# Patient Record
Sex: Female | Born: 2000 | Race: White | Hispanic: No | Marital: Single | Smoking: Former smoker
Health system: Southern US, Community
[De-identification: ages and names within clinical notes are randomized; demographics above are authoritative.]

## PROBLEM LIST (undated history)

## (undated) ENCOUNTER — Ambulatory Visit: Admission: EM

## (undated) ENCOUNTER — Inpatient Hospital Stay: Payer: Self-pay

## (undated) ENCOUNTER — Emergency Department: Discharge status: Absent without leave | Payer: Medicaid Other

## (undated) DIAGNOSIS — F605 Obsessive-compulsive personality disorder: Secondary | ICD-10-CM

## (undated) DIAGNOSIS — R51 Headache: Secondary | ICD-10-CM

## (undated) DIAGNOSIS — R519 Headache, unspecified: Secondary | ICD-10-CM

## (undated) DIAGNOSIS — F32A Depression, unspecified: Secondary | ICD-10-CM

## (undated) DIAGNOSIS — F909 Attention-deficit hyperactivity disorder, unspecified type: Secondary | ICD-10-CM

## (undated) DIAGNOSIS — F419 Anxiety disorder, unspecified: Secondary | ICD-10-CM

## (undated) DIAGNOSIS — F329 Major depressive disorder, single episode, unspecified: Secondary | ICD-10-CM

## (undated) HISTORY — PX: NO PAST SURGERIES: SHX2092

---
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
## 2019-04-01 ENCOUNTER — Emergency Department
Admission: EM | Admit: 2019-04-01 | Discharge: 2019-04-02 | Disposition: A | Discharge status: Other Acute Inpatient Hospital | Payer: Medicaid Other | Attending: Emergency Medicine | Admitting: Emergency Medicine

## 2019-04-01 ENCOUNTER — Encounter: Payer: Self-pay | Admitting: Emergency Medicine

## 2019-04-01 ENCOUNTER — Other Ambulatory Visit: Payer: Self-pay

## 2019-04-01 DIAGNOSIS — Z20828 Contact with and (suspected) exposure to other viral communicable diseases: Secondary | ICD-10-CM | POA: Diagnosis not present

## 2019-04-01 DIAGNOSIS — F16188 Hallucinogen abuse with other hallucinogen-induced disorder: Secondary | ICD-10-CM | POA: Diagnosis not present

## 2019-04-01 DIAGNOSIS — F1910 Other psychoactive substance abuse, uncomplicated: Secondary | ICD-10-CM

## 2019-04-01 DIAGNOSIS — R451 Restlessness and agitation: Secondary | ICD-10-CM | POA: Diagnosis present

## 2019-04-01 DIAGNOSIS — R45851 Suicidal ideations: Secondary | ICD-10-CM | POA: Insufficient documentation

## 2019-04-01 DIAGNOSIS — Z79899 Other long term (current) drug therapy: Secondary | ICD-10-CM | POA: Insufficient documentation

## 2019-04-01 DIAGNOSIS — F4310 Post-traumatic stress disorder, unspecified: Secondary | ICD-10-CM | POA: Diagnosis present

## 2019-04-01 DIAGNOSIS — F329 Major depressive disorder, single episode, unspecified: Secondary | ICD-10-CM | POA: Insufficient documentation

## 2019-04-01 DIAGNOSIS — Z87891 Personal history of nicotine dependence: Secondary | ICD-10-CM | POA: Insufficient documentation

## 2019-04-01 LAB — CBC
HCT: 44.3 % (ref 36.0–46.0)
Hemoglobin: 15.2 g/dL — ABNORMAL HIGH (ref 12.0–15.0)
MCH: 30.2 pg (ref 26.0–34.0)
MCHC: 34.3 g/dL (ref 30.0–36.0)
MCV: 88.1 fL (ref 80.0–100.0)
Platelets: 314 10*3/uL (ref 150–400)
RBC: 5.03 MIL/uL (ref 3.87–5.11)
RDW: 12.1 % (ref 11.5–15.5)
WBC: 6.2 10*3/uL (ref 4.0–10.5)
nRBC: 0 % (ref 0.0–0.2)

## 2019-04-01 LAB — URINE DRUG SCREEN, QUALITATIVE (ARMC ONLY)
Amphetamines, Ur Screen: POSITIVE — AB
Barbiturates, Ur Screen: NOT DETECTED
Benzodiazepine, Ur Scrn: NOT DETECTED
Cannabinoid 50 Ng, Ur ~~LOC~~: POSITIVE — AB
Cocaine Metabolite,Ur ~~LOC~~: NOT DETECTED
MDMA (Ecstasy)Ur Screen: NOT DETECTED
Methadone Scn, Ur: NOT DETECTED
Opiate, Ur Screen: NOT DETECTED
Phencyclidine (PCP) Ur S: NOT DETECTED
Tricyclic, Ur Screen: NOT DETECTED

## 2019-04-01 LAB — COMPREHENSIVE METABOLIC PANEL
ALT: 23 U/L (ref 0–44)
AST: 30 U/L (ref 15–41)
Albumin: 4.7 g/dL (ref 3.5–5.0)
Alkaline Phosphatase: 71 U/L (ref 38–126)
Anion gap: 9 (ref 5–15)
BUN: 16 mg/dL (ref 6–20)
CO2: 23 mmol/L (ref 22–32)
Calcium: 9.5 mg/dL (ref 8.9–10.3)
Chloride: 106 mmol/L (ref 98–111)
Creatinine, Ser: 0.79 mg/dL (ref 0.44–1.00)
GFR calc Af Amer: >60 mL/min (ref >60)
GFR calc non Af Amer: >60 mL/min (ref >60)
Glucose, Bld: 103 mg/dL — ABNORMAL HIGH (ref 70–99)
Potassium: 3.3 mmol/L — ABNORMAL LOW (ref 3.5–5.1)
Sodium: 138 mmol/L (ref 135–145)
Total Bilirubin: 1 mg/dL (ref 0.3–1.2)
Total Protein: 7.7 g/dL (ref 6.5–8.1)

## 2019-04-01 LAB — SALICYLATE LEVEL: Salicylate Lvl: <7 mg/dL (ref 2.8–30.0)

## 2019-04-01 LAB — ETHANOL: Alcohol, Ethyl (B): <10 mg/dL (ref <10)

## 2019-04-01 LAB — ACETAMINOPHEN LEVEL: Acetaminophen (Tylenol), Serum: <10 ug/mL — ABNORMAL LOW (ref 10–30)

## 2019-04-01 MED ORDER — ONDANSETRON 4 MG PO TBDP
4.0000 mg | ORAL_TABLET | Freq: Three times a day (TID) | ORAL | Status: DC | PRN
  Filled 2019-04-01: qty 1

## 2019-04-01 MED ORDER — QUETIAPINE FUMARATE 25 MG PO TABS
25.0000 mg | ORAL_TABLET | Freq: Two times a day (BID) | ORAL | Status: DC
  Administered 2019-04-01 – 2019-04-02 (×3): 25 mg via ORAL
  Filled 2019-04-01 (×4): qty 1

## 2019-04-01 MED ORDER — LORAZEPAM 0.5 MG PO TABS: 0.5000 mg | ORAL_TABLET | Freq: Once | ORAL | Status: DC

## 2019-04-01 MED ORDER — ONDANSETRON 4 MG PO TBDP
4.0000 mg | ORAL_TABLET | Freq: Once | ORAL | Status: AC
  Administered 2019-04-01: 14:00:00 4 mg via ORAL
  Filled 2019-04-01: qty 1
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
## 2019-04-01 NOTE — ED Notes (Signed)
Pt. Sleeping in bed at this time. Pt. Is IVC and psych. Consult pending.

## 2019-04-01 NOTE — ED Notes (Signed)
Upon dressing out patient, pt states, "Do I have to stay, I just want to go home, I don't want to have to talk to nobody."  This RN explained to patient that she is here voluntarily and we cannot keep her here against her will.  Pt A/Ox4, ambulatory out to lobby without difficulty.  First RN notified.

## 2019-04-01 NOTE — ED Notes (Signed)
IVC/Consult ordered/Pending 

## 2019-04-01 NOTE — ED Notes (Signed)
1 pair earrings and 1 nose ring given to patient's family member.

## 2019-04-01 NOTE — ED Provider Notes (Signed)
[facility] Emergency Department Provider Note  ____________________________________________   First MD Initiated Contact with Patient 04/01/19 1344     (approximate)  I have reviewed the triage vital signs and the nursing notes.   HISTORY  Chief Complaint Psychiatric Evaluation    HPI Carmen Black is a 18 y.o. female  Here with acute agitation, suicidal ideation. Pt somewhat evasive on exam, limiting history. Per her report, she just started using ecstasy four days ago, and has been using it "constantly" over the past several days. She states she has not slept in >48 hours. She's felt nauseous, flushed, and is having intermittent auditory and visual hallucinations. She arrives voluntarily but is requesting to leave. She denies overt SI, HI but does states she does not care if the drugs were to kill her. Per collateral from family report to RN, pt has been voicing suicidal ideation and thoughts of intentionally overdosing.  Level 5 caveat invoked as remainder of history, ROS, and physical exam limited due to patient's psychiatric disease     Past Medical History:  Diagnosis Date  . ADHD (attention deficit hyperactivity disorder)   . Anxiety   . 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 Depression   . Headache     Patient Active Problem List   Diagnosis Date Noted  . ODD (oppositional defiant disorder) 09/05/2015  . Major depressive disorder, recurrent episode, moderate ([place]) 09/04/2015  . Acne vulgaris 08/15/2015  . ADD (attention deficit disorder) 04/20/2014    History reviewed. No pertinent surgical history.  Prior to Admission medications   Medication Sig Start Date End Date Taking? Authorizing Provider  cyclobenzaprine (FLEXERIL) 5 MG tablet Take 1 tablet (5 mg total) by mouth 3 (three) times daily as needed for muscle spasms. 07/10/18   Menshew, Dannielle Karvonen, PA-C  fluticasone (FLONASE) 50 MCG/ACT nasal spray Place 2 sprays into both  nostrils daily.    [provider]  medroxyPROGESTERone (DEPO-PROVERA) 150 MG/ML injection Inject 150 mg into the muscle every 3 (three) months.    [provider]  metroNIDAZOLE (FLAGYL) 500 MG tablet Take 1 tablet (500 mg total) by mouth 3 (three) times daily. 08/21/18   Fisher, Linden Dolin, PA-C  ondansetron (ZOFRAN ODT) 4 MG disintegrating tablet Take 1 tablet (4 mg total) by mouth every 8 (eight) hours as needed for nausea or vomiting. 08/14/18   Rudene Re, MD    Allergies Patient has no known allergies.  No family history on file.  Social History Social History   Tobacco Use  . Smoking status: Former Research scientist (life sciences)  . Smokeless tobacco: Never Used  Substance Use Topics  . Alcohol use: Yes  . Drug use: Yes    Review of Systems  Review of Systems  Constitutional: Negative for fatigue and fever.  HENT: Negative for congestion and sore throat.   Eyes: Negative for visual disturbance.  Respiratory: Negative for cough and shortness of breath.   Cardiovascular: Negative for chest pain.  Gastrointestinal: Negative for abdominal pain, diarrhea, nausea and vomiting.  Genitourinary: Negative for flank pain.  Musculoskeletal: Negative for back pain and neck pain.  Skin: Negative for rash and wound.  Neurological: Negative for weakness.  Psychiatric/Behavioral: Positive for behavioral problems, dysphoric mood and hallucinations. The patient is hyperactive.      ____________________________________________  PHYSICAL EXAM:      VITAL SIGNS: ED Triage Vitals  Enc Vitals Group     BP 04/01/19 1249 119/75     Pulse Rate 04/01/19  1249 98     Resp 04/01/19 1249 16     Temp 04/01/19 1249 98.2 F (36.8 C)     Temp Source 04/01/19 1249 Oral     SpO2 04/01/19 1249 100 %     Weight 04/01/19 1248 140 lb (63.5 kg)     Height 04/01/19 1248 5\' 2"  (1.575 m)     Head Circumference --      Peak Flow --      Pain Score 04/01/19 1251 3     Pain Loc --      Pain Edu? --       Excl. in GC? --      Physical Exam Vitals signs and nursing note reviewed.  Constitutional:      General: She is not in acute distress.    Appearance: She is well-developed.  HENT:     Head: Normocephalic and atraumatic.  Eyes:     Conjunctiva/sclera: Conjunctivae normal.  Neck:     Musculoskeletal: Neck supple.  Cardiovascular:     Rate and Rhythm: Normal rate and regular rhythm.     Heart sounds: Normal heart sounds.  Pulmonary:     Effort: Pulmonary effort is normal. No respiratory distress.     Breath sounds: No wheezing.  Abdominal:     General: There is no distension.  Skin:    General: Skin is warm.     Capillary Refill: Capillary refill takes less than 2 seconds.     Findings: No rash.  Neurological:     Mental Status: She is alert and oriented to person, place, and time.     Motor: No abnormal muscle tone.  Psychiatric:        Mood and Affect: Mood is anxious.        Behavior: Behavior is agitated.        Thought Content: Thought content is paranoid.       ____________________________________________   LABS (all labs ordered are listed, but only abnormal results are displayed)  Labs Reviewed  COMPREHENSIVE METABOLIC PANEL - Abnormal; Notable for the following components:      Result Value   Potassium 3.3 (*)    Glucose, Bld 103 (*)    All other components within normal limits  ACETAMINOPHEN LEVEL - Abnormal; Notable for the following components:   Acetaminophen (Tylenol), Serum <10 (*)    All other components within normal limits  CBC - Abnormal; Notable for the following components:   Hemoglobin 15.2 (*)    All other components within normal limits  URINE DRUG SCREEN, QUALITATIVE ([facility] ONLY) - Abnormal; Notable for the following components:   Amphetamines, Ur Screen POSITIVE (*)    Cannabinoid 50 Ng, Ur [place] POSITIVE (*)    All other components within normal limits  ETHANOL  SALICYLATE LEVEL  POC URINE PREG, ED     ____________________________________________  EKG: None ________________________________________  RADIOLOGY All imaging, including plain films, CT scans, and ultrasounds, independently reviewed by me, and interpretations confirmed via formal radiology reads.  ED MD interpretation:   None  Official radiology report(s): No results found.  ____________________________________________  PROCEDURES   Procedure(s) performed (including Critical Care):  Procedures  ____________________________________________  INITIAL IMPRESSION / MDM / ASSESSMENT AND PLAN / ED COURSE  As part of my medical decision making, I reviewed the following data within the electronic MEDICAL RECORD NUMBER Notes from prior ED visits and [place] Controlled Substance Database      *Carmen Black was evaluated in Emergency Department  on 04/01/2019 for the symptoms described in the history of present illness. She was evaluated in the context of the global COVID-19 pandemic, which necessitated consideration that the patient might be at risk for infection with the SARS-CoV-2 virus that causes COVID-19. Institutional protocols and algorithms that pertain to the evaluation of patients at risk for COVID-19 are in a state of rapid change based on information released by regulatory bodies including the CDC and federal and state organizations. These policies and algorithms were followed during the patient's care in the ED.  Some ED evaluations and interventions may be delayed as a result of limited staffing during the pandemic.*      Medical Decision Making: 18 yo F here with polysubstance abuse and question of intentional self-harm vs SI. Labs show +amphetamines, THC on UDS, but are o/w unremarkable. No signs of acute medical emergency. TTS/Psych consulted.  ____________________________________________  FINAL CLINICAL IMPRESSION(S) / ED DIAGNOSES  Final diagnoses:  Substance abuse (HCC)     MEDICATIONS GIVEN DURING THIS  VISIT:  Medications  ondansetron (ZOFRAN-ODT) disintegrating tablet 4 mg (has no administration in time range)  LORazepam (ATIVAN) tablet 0.5 mg (0 mg Oral Hold 04/01/19 1636)  QUEtiapine (SEROQUEL) tablet 25 mg (25 mg Oral Given 04/01/19 1722)  ondansetron (ZOFRAN-ODT) disintegrating tablet 4 mg (4 mg Oral Given 04/01/19 1401)     ED Discharge Orders    None       Note:  This document was prepared using Dragon voice recognition software and may include unintentional dictation errors.   Shaune PollackIsaacs, Adalene Gulotta, MD 04/01/19 1944

## 2019-04-01 NOTE — ED Notes (Signed)
Urine POC negative. 

## 2019-04-01 NOTE — ED Notes (Signed)
Pt kicking her bed, threatening to walk out.

## 2019-04-01 NOTE — ED Notes (Signed)
Pt dressed out by this RN and Amy, RN. 1 pair crocs, 1 pair shorts, 1 pair under wear, 1 t-shirt placed into patient belongings bag at this time.

## 2019-04-01 NOTE — ED Triage Notes (Signed)
Pt in via POV, states, "I have been taking a bunch of Ecstasy over the past 4 days, I didn't take any yesterday and started tripping out. My mouth felt like it was clinched shut."  Pt denies SI/HI.  A/Ox4, NAD noted at this time.
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
## 2019-04-01 NOTE — ED Notes (Addendum)
Pt wanting to leave. RN spoke with patient's best friend's mom (Ms. Feliciana Rossetti) whom she lives with. Ms. Feliciana Rossetti feels patient is  a threat to herself. Patient has taken numerous drugs over the last few days in attempt to kill herself. "She took 4 pills and when she woke up and realized she wasn't dead she took one more."  Ms. Feliciana Rossetti also states the patient hears voices.  Ms. Feliciana Rossetti is leaving on vacation tomorrow morning and the patient would be home alone with no adult supervision.

## 2019-04-01 NOTE — ED Notes (Signed)
This RN out to speak to patient outside, pt's best friend's mom on phone states, "she is a threat to herself", reports patient doing a lot of drugs in attempt to harm herself. Pt tearful but denying active SI/HI at this time. Pt voluntary at this time. Pt refusing to be seen stating, "they aren't going to send me away".

## 2019-04-01 NOTE — ED Provider Notes (Signed)
Additional collateral history provided by family does reveal concern for self-harm in the setting of her substance abuse.  Patient will be placed under IVC.   Merlyn Lot, MD 04/01/19 [phone]

## 2019-04-02 ENCOUNTER — Inpatient Hospital Stay
Admission: AD | Admit: 2019-04-02 | Discharge: 2019-04-03 | DRG: 882 | Disposition: A | Discharge status: Home / Self Care | Payer: Medicaid Other | Attending: Psychiatry | Admitting: Psychiatry

## 2019-04-02 DIAGNOSIS — F331 Major depressive disorder, recurrent, moderate: Secondary | ICD-10-CM | POA: Diagnosis present

## 2019-04-02 DIAGNOSIS — F419 Anxiety disorder, unspecified: Secondary | ICD-10-CM | POA: Diagnosis present

## 2019-04-02 DIAGNOSIS — F909 Attention-deficit hyperactivity disorder, unspecified type: Secondary | ICD-10-CM | POA: Diagnosis present

## 2019-04-02 DIAGNOSIS — F4310 Post-traumatic stress disorder, unspecified: Secondary | ICD-10-CM | POA: Diagnosis present

## 2019-04-02 DIAGNOSIS — T43641A Poisoning by ecstasy, accidental (unintentional), initial encounter: Secondary | ICD-10-CM | POA: Diagnosis present

## 2019-04-02 DIAGNOSIS — F1994 Other psychoactive substance use, unspecified with psychoactive substance-induced mood disorder: Secondary | ICD-10-CM | POA: Diagnosis present

## 2019-04-02 DIAGNOSIS — Z79899 Other long term (current) drug therapy: Secondary | ICD-10-CM | POA: Diagnosis not present

## 2019-04-02 DIAGNOSIS — F16188 Hallucinogen abuse with other hallucinogen-induced disorder: Secondary | ICD-10-CM | POA: Diagnosis not present

## 2019-04-02 LAB — SARS CORONAVIRUS 2 BY RT PCR (HOSPITAL ORDER, PERFORMED IN ~~LOC~~ HOSPITAL LAB): SARS Coronavirus 2: NEGATIVE

## 2019-04-02 MED ORDER — ACETAMINOPHEN 325 MG PO TABS: 650.0000 mg | ORAL_TABLET | Freq: Four times a day (QID) | ORAL | Status: DC | PRN

## 2019-04-02 MED ORDER — MAGNESIUM HYDROXIDE 400 MG/5ML PO SUSP
30.0000 mL | Freq: Every day | ORAL | Status: DC | PRN
  Filled 2019-04-02: qty 30

## 2019-04-02 MED ORDER — ALUM & MAG HYDROXIDE-SIMETH 200-200-20 MG/5ML PO SUSP: 30.0000 mL | ORAL | Status: DC | PRN

## 2019-04-02 NOTE — ED Notes (Signed)
Report given to [facility] at [facility].

## 2019-04-02 NOTE — ED Notes (Signed)
ED BHU PLACEMENT JUSTIFICATION Is the patient under IVC or is there intent for IVC: Yes.   Is the patient medically cleared: Yes.   Is there vacancy in the ED BHU: Yes.   Is the population mix appropriate for patient: Yes.   Is the patient awaiting placement in inpatient or outpatient setting: Yes.   Has the patient had a psychiatric consult: Yes.   Survey of unit performed for contraband, proper placement and condition of furniture, tampering with fixtures in bathroom, shower, and each patient room: Yes.  ; Findings:  APPEARANCE/BEHAVIOR Calm and cooperative NEURO ASSESSMENT Orientation: oriented x3  Denies pain Hallucinations: No.None noted (Hallucinations) Speech: Normal Gait: normal RESPIRATORY ASSESSMENT Even  Unlabored respirations  CARDIOVASCULAR ASSESSMENT Pulses equal   regular rate  Skin warm and dry   GASTROINTESTINAL ASSESSMENT no GI complaint EXTREMITIES Full ROM  PLAN OF CARE Provide calm/safe environment. Vital signs assessed twice daily. ED BHU Assessment once each 12-hour shift.  Assure the ED provider has rounded once each shift. Provide and encourage hygiene. Provide redirection as needed. Assess for escalating behavior; address immediately and inform ED provider.  Assess family dynamic and appropriateness for visitation as needed: Yes.  ; If necessary, describe findings:  Educate the patient/family about BHU procedures/visitation: Yes.  ; If necessary, describe findings:   

## 2019-04-02 NOTE — ED Provider Notes (Signed)
-----------------------------------------   7:30 AM on 04/02/2019 -----------------------------------------   Blood pressure 120/69, pulse 77, temperature 98.9 F (37.2 C), temperature source Oral, resp. rate 16, height 5\' 2"  (1.575 m), weight 63.5 kg, SpO2 98 %.  The patient is calm and cooperative at this time.  There have been no acute events since the last update.  Awaiting disposition plan from Behavioral Medicine team.    Schuyler Amor, MD 04/02/19 0730

## 2019-04-02 NOTE — ED Provider Notes (Signed)
-----------------------------------------   11:49 PM on 04/02/2019 -----------------------------------------  Patient has been accepted to behavioral health unit.   Paulette Blanch, MD 04/02/19 [phone]

## 2019-04-02 NOTE — ED Notes (Signed)
Pt states she has not graduated high school at this time, states she will be a senior in the upcoming school year.

## 2019-04-02 NOTE — ED Notes (Signed)
IVC/Moved to BHU-7/Waiting on BMU bed assignment

## 2019-04-02 NOTE — ED Notes (Signed)
Pt given meal tray and a ginger ale.  

## 2019-04-02 NOTE — ED Notes (Signed)
Pt given supplies to take a shower. Pt in the shower at this time.

## 2019-04-02 NOTE — ED Notes (Signed)
IVC/  PENDING  PLACEMENT 

## 2019-04-02 NOTE — ED Notes (Signed)
BEHAVIORAL HEALTH ROUNDING Patient sleeping: Yes.   Patient alert and oriented: eyes closed  Appears asleep Behavior appropriate: Yes.  ; If no, describe:  Nutrition and fluids offered: Yes  Toileting and hygiene offered: sleeping Sitter present: q 15 minute observations and security camera monitoring Law enforcement present: yes  ODS 

## 2019-04-02 NOTE — ED Notes (Signed)
Patient observed lying in bed with eyes closed  Even, unlabored respirations observed   NAD pt appears to be sleeping  I will continue to monitor along with every 15 minute visual observations and ongoing security camera monitoring    

## 2019-04-02 NOTE — ED Notes (Signed)
BEHAVIORAL HEALTH ROUNDING Patient sleeping: No. Patient alert and oriented: yes Behavior appropriate: Yes.  ; If no, describe:  Nutrition and fluids offered: yes Toileting and hygiene offered: Yes  Sitter present: q15 minute observations and security camera monitoring Law enforcement present: Yes  ODS  

## 2019-04-02 NOTE — BH Assessment (Signed)
Patient is to be admitted to [facility] by Dr. Einar Grad.  Attending Physician will be Dr. Weber Cooks.   Patient has been assigned to room 322, by [facility] staff is aware of the admission:  Glenda, ER Secretary    Dr. Burlene Arnt, ER MD   Amy B.,, Patient's Nurse   Vonna Kotyk, Patient Access.

## 2019-04-02 NOTE — ED Notes (Signed)

## 2019-04-03 ENCOUNTER — Other Ambulatory Visit: Payer: Self-pay

## 2019-04-03 DIAGNOSIS — F4310 Post-traumatic stress disorder, unspecified: Principal | ICD-10-CM

## 2019-04-03 DIAGNOSIS — F1994 Other psychoactive substance use, unspecified with psychoactive substance-induced mood disorder: Secondary | ICD-10-CM

## 2019-04-03 DIAGNOSIS — T43641A Poisoning by ecstasy, accidental (unintentional), initial encounter: Secondary | ICD-10-CM

## 2019-04-03 NOTE — BHH Counselor (Signed)
Pt scheduled to discharge today. CSW discussed with pt discharge plan. Pt states she is in agreement with follow up at [facility] for outpatient treatment.

## 2019-04-03 NOTE — [facility] Group Notes (Signed)
LCSW Group Therapy Note  04/03/2019 1:00 PM  Type of Therapy and Topic:  Group Therapy:  Feelings around Relapse and Recovery  Participation Level:  Minimal   Description of Group:    Patients in this group will discuss emotions they experience before and after a relapse. They will process how experiencing these feelings, or avoidance of experiencing them, relates to having a relapse. Facilitator will guide patients to explore emotions they have related to recovery. Patients will be encouraged to process which emotions are more powerful. They will be guided to discuss the emotional reaction significant others in their lives may have to their relapse or recovery. Patients will be assisted in exploring ways to respond to the emotions of others without this contributing to a relapse.  Therapeutic Goals: 1. Patient will identify two or more emotions that lead to a relapse for them 2. Patient will identify two emotions that result when they relapse 3. Patient will identify two emotions related to recovery 4. Patient will demonstrate ability to communicate their needs through discussion and/or role plays   Summary of Patient Progress: Patient attended group, however, did not engage in discussions.  Patient requested to meet with CSW following group to discuss why she is on the unit.    Therapeutic Modalities:   Cognitive Behavioral Therapy Solution-Focused Therapy Assertiveness Training Relapse Prevention Therapy   Assunta Curtis, MSW, LCSW 04/03/2019 12:58 PM

## 2019-04-03 NOTE — Tx Team (Signed)
Initial Treatment Plan 04/03/2019 1:05 AM [name] RCV:[id number]    PATIENT STRESSORS: Marital or family conflict   PATIENT STRENGTHS: Active sense of humor Average or above average intelligence Capable of independent living Communication skills   PATIENT IDENTIFIED PROBLEMS:     suicidal                 DISCHARGE CRITERIA:  Ability to meet basic life and health needs Adequate post-discharge living arrangements Improved stabilization in mood, thinking, and/or behavior Medical problems require only outpatient monitoring Motivation to continue treatment in a less acute level of care Need for constant or close observation no longer present Reduction of life-threatening or endangering symptoms to within safe limits Safe-care adequate arrangements made Verbal commitment to aftercare and medication compliance  PRELIMINARY DISCHARGE PLAN: Outpatient therapy Return to previous living arrangement  PATIENT/FAMILY INVOLVEMENT: This treatment plan has been presented to and reviewed with the patient, Carmen Black, and/or family member.  The patient and family have been given the opportunity to ask questions and make suggestions.  Libby Maw, RN 04/03/2019, 1:05 AM

## 2019-04-03 NOTE — Progress Notes (Signed)
  [facility] Adult Case Management Discharge Plan :  Will you be returning to the same living situation after discharge:  No. pt reports she will be living with her cousin named Chrisitina At discharge, do you have transportation home?: Yes,  pt reports one of her grandmother will pick her upManuela Schwartz or [employer]) Do you have the ability to pay for your medications: Yes,  insurance  Release of information consent forms completed and in the chart;  Patient's signature needed at discharge.  Patient to Follow up at: Follow-up [place] Follow up.   Why: Please follow up with [facility] on Friday, July 10th at 10:00am.  This will be a telephone appointment.  Contact information: [place] [address] [phone]           Next level of care provider has access to [place] and Suicide Prevention discussed: Yes,  Fuller Song, best friends mother  Have you used any form of tobacco in the last 30 days? (Cigarettes, Smokeless Tobacco, Cigars, and/or Pipes): No  Has patient been referred to the Quitline?: N/A patient is not a smoker  Patient has been referred for addiction treatment: [place], LCSW 04/03/2019, 3:13 PM

## 2019-04-03 NOTE — Progress Notes (Signed)
Recreation Therapy Notes   Date: 04/03/2019  Time: 9:30 am   Location: Craft room   Behavioral response: N/A   Intervention Topic: Leisure  Discussion/Intervention: Patient did not attend group.   Clinical Observations/Feedback:  Patient did not attend group.   Wissam Resor LRT/CTRS        Inas Avena 04/03/2019 11:02 AM
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
## 2019-04-03 NOTE — Plan of Care (Signed)
Problem: Education: Goal: Knowledge of [facility] Education information/materials will improve 04/03/2019 1604 by Chalmers CaterSnider, Keelia Graybill M, RN Outcome: Adequate for Discharge 04/03/2019 1049 by Chalmers CaterSnider, Murad Staples M, RN Outcome: Progressing Goal: Emotional status will improve 04/03/2019 1604 by Chalmers CaterSnider, Stanley Lyness M, RN Outcome: Adequate for Discharge 04/03/2019 1049 by Chalmers CaterSnider, Makinzey Banes M, RN Outcome: Not Progressing Goal: Mental status will improve 04/03/2019 1604 by Chalmers CaterSnider, Abad Manard M, RN Outcome: Adequate for Discharge 04/03/2019 1049 by Chalmers CaterSnider, Mairany Bruno M, RN Outcome: Not Progressing Goal: Verbalization of understanding the information provided will improve 04/03/2019 1604 by Chalmers CaterSnider, Jaklyn Alen M, RN Outcome: Adequate for Discharge 04/03/2019 1049 by Chalmers CaterSnider, Kao Berkheimer M, RN Outcome: Progressing   Problem: Activity: Goal: Interest or engagement in activities will improve 04/03/2019 1604 by Chalmers CaterSnider, Seriah Brotzman M, RN Outcome: Adequate for Discharge 04/03/2019 1049 by Chalmers CaterSnider, Granvil Djordjevic M, RN Outcome: Not Progressing Goal: Sleeping patterns will improve 04/03/2019 1604 by Chalmers CaterSnider, Penny Arrambide M, RN Outcome: Adequate for Discharge 04/03/2019 1049 by Chalmers CaterSnider, Hayes Czaja M, RN Outcome: Not Progressing   Problem: Coping: Goal: Ability to verbalize frustrations and anger appropriately will improve 04/03/2019 1604 by Chalmers CaterSnider, Zaeden Lastinger M, RN Outcome: Adequate for Discharge 04/03/2019 1049 by Chalmers CaterSnider, Amyjo Mizrachi M, RN Outcome: Progressing Goal: Ability to demonstrate self-control will improve 04/03/2019 1604 by Chalmers CaterSnider, Efosa Treichler M, RN Outcome: Adequate for Discharge 04/03/2019 1049 by Chalmers CaterSnider, Kailany Dinunzio M, RN Outcome: Progressing   Problem: Health Behavior/Discharge Planning: Goal: Identification of resources available to assist in meeting health care needs will improve 04/03/2019 1604 by Chalmers CaterSnider, Antjuan Rothe M, RN Outcome: Adequate for Discharge 04/03/2019 1049 by Chalmers CaterSnider, Muath Hallam M, RN Outcome: Progressing Goal: Compliance with treatment plan for underlying cause of condition will  improve 04/03/2019 1604 by Chalmers CaterSnider, Brittnei Jagiello M, RN Outcome: Adequate for Discharge 04/03/2019 1049 by Chalmers CaterSnider, Aylin Rhoads M, RN Outcome: Progressing   Problem: Physical Regulation: Goal: Ability to maintain clinical measurements within normal limits will improve 04/03/2019 1604 by Chalmers CaterSnider, Edsel Shives M, RN Outcome: Adequate for Discharge 04/03/2019 1049 by Chalmers CaterSnider, Kista Robb M, RN Outcome: Progressing   Problem: Safety: Goal: Periods of time without injury will increase 04/03/2019 1604 by Chalmers CaterSnider, Jobe Mutch M, RN Outcome: Adequate for Discharge 04/03/2019 1049 by Chalmers CaterSnider, Anamari Galeas M, RN Outcome: Progressing   Problem: Education: Goal: Utilization of techniques to improve thought processes will improve 04/03/2019 1604 by Chalmers CaterSnider, Daden Mahany M, RN Outcome: Adequate for Discharge 04/03/2019 1049 by Chalmers CaterSnider, Maryclaire Stoecker M, RN Outcome: Progressing Goal: Knowledge of the prescribed therapeutic regimen will improve 04/03/2019 1604 by Chalmers CaterSnider, Roshaunda Starkey M, RN Outcome: Adequate for Discharge 04/03/2019 1049 by Chalmers CaterSnider, Renate Danh M, RN Outcome: Progressing   Problem: Activity: Goal: Interest or engagement in leisure activities will improve 04/03/2019 1604 by Chalmers CaterSnider, Maytal Mijangos M, RN Outcome: Adequate for Discharge 04/03/2019 1049 by Chalmers CaterSnider, Jameika Kinn M, RN Outcome: Not Progressing Goal: Imbalance in normal sleep/wake cycle will improve 04/03/2019 1604 by Chalmers CaterSnider, Dyan Creelman M, RN Outcome: Adequate for Discharge 04/03/2019 1049 by Chalmers CaterSnider, Graceanne Guin M, RN Outcome: Not Progressing   Problem: Coping: Goal: Coping ability will improve 04/03/2019 1604 by Chalmers CaterSnider, Tayanna Talford M, RN Outcome: Adequate for Discharge 04/03/2019 1049 by Chalmers CaterSnider, Ivonne Freeburg M, RN Outcome: Not Progressing Goal: Will verbalize feelings 04/03/2019 1604 by Chalmers CaterSnider, Pailynn Vahey M, RN Outcome: Adequate for Discharge 04/03/2019 1049 by Chalmers CaterSnider, Lenyx Boody M, RN Outcome: Not Progressing   Problem: Health Behavior/Discharge Planning: Goal: Ability to make decisions will improve 04/03/2019 1604 by Chalmers CaterSnider, Darik Massing M, RN Outcome: Adequate for  Discharge 04/03/2019 1049 by Chalmers CaterSnider, Nidya Bouyer M, RN Outcome: Progressing Goal: Compliance with therapeutic regimen will improve 04/03/2019 1604 by Drue SecondSnider,  Hedwig Morton, RN Outcome: Adequate for Discharge 04/03/2019 1049 by Kieth Brightly, RN Outcome: Progressing   Problem: Role Relationship: Goal: Will demonstrate positive changes in social behaviors and relationships 04/03/2019 1604 by Kieth Brightly, RN Outcome: Adequate for Discharge 04/03/2019 1049 by Kieth Brightly, RN Outcome: Progressing   Problem: Safety: Goal: Ability to disclose and discuss suicidal ideas will improve 04/03/2019 1604 by Kieth Brightly, RN Outcome: Adequate for Discharge 04/03/2019 1049 by Kieth Brightly, RN Outcome: Progressing Goal: Ability to identify and utilize support systems that promote safety will improve 04/03/2019 1604 by Kieth Brightly, RN Outcome: Adequate for Discharge 04/03/2019 1049 by Kieth Brightly, RN Outcome: Progressing   Problem: Self-Concept: Goal: Will verbalize positive feelings about self 04/03/2019 1604 by Kieth Brightly, RN Outcome: Adequate for Discharge 04/03/2019 1049 by Kieth Brightly, RN Outcome: Not Progressing Goal: Level of anxiety will decrease 04/03/2019 1604 by Kieth Brightly, RN Outcome: Adequate for Discharge 04/03/2019 1049 by Kieth Brightly, RN Outcome: Not Progressing

## 2019-04-03 NOTE — Plan of Care (Signed)
Pr admits to having depression but cannot rate. Pt denies anxiety, SI HI and AVH. Pt was educated on care plan and verbalizes understanding. Collier Bullock RN Problem: Education: Goal: Knowledge of [facility] Education information/materials will improve Outcome: Progressing Goal: Emotional status will improve Outcome: Not Progressing Goal: Mental status will improve Outcome: Not Progressing Goal: Verbalization of understanding the information provided will improve Outcome: Progressing   Problem: Activity: Goal: Interest or engagement in activities will improve Outcome: Not Progressing Goal: Sleeping patterns will improve Outcome: Not Progressing   Problem: Coping: Goal: Ability to verbalize frustrations and anger appropriately will improve Outcome: Progressing Goal: Ability to demonstrate self-control will improve Outcome: Progressing   Problem: Health Behavior/Discharge Planning: Goal: Identification of resources available to assist in meeting health care needs will improve Outcome: Progressing Goal: Compliance with treatment plan for underlying cause of condition will improve Outcome: Progressing   Problem: Physical Regulation: Goal: Ability to maintain clinical measurements within normal limits will improve Outcome: Progressing   Problem: Safety: Goal: Periods of time without injury will increase Outcome: Progressing   Problem: Education: Goal: Utilization of techniques to improve thought processes will improve Outcome: Progressing Goal: Knowledge of the prescribed therapeutic regimen will improve Outcome: Progressing   Problem: Activity: Goal: Interest or engagement in leisure activities will improve Outcome: Not Progressing Goal: Imbalance in normal sleep/wake cycle will improve Outcome: Not Progressing   Problem: Coping: Goal: Coping ability will improve Outcome: Not Progressing Goal: Will verbalize feelings Outcome: Not Progressing   Problem:  Health Behavior/Discharge Planning: Goal: Ability to make decisions will improve Outcome: Progressing Goal: Compliance with therapeutic regimen will improve Outcome: Progressing   Problem: Role Relationship: Goal: Will demonstrate positive changes in social behaviors and relationships Outcome: Progressing   Problem: Safety: Goal: Ability to disclose and discuss suicidal ideas will improve Outcome: Progressing Goal: Ability to identify and utilize support systems that promote safety will improve Outcome: Progressing   Problem: Self-Concept: Goal: Will verbalize positive feelings about self Outcome: Not Progressing Goal: Level of anxiety will decrease Outcome: Not Progressing

## 2019-04-03 NOTE — Tx Team (Addendum)
Interdisciplinary Treatment and Diagnostic Plan Update  04/03/2019 Time of Session: 1030am Carmen Black MRN: [id number]  Principal Diagnosis: <principal problem not specified>  Secondary Diagnoses: Active Problems:   PTSD (post-traumatic stress disorder)   Current Medications:  No current facility-administered medications for this encounter.    PTA Medications: Medications Prior to Admission  Medication Sig Dispense Refill Last Dose  . cyclobenzaprine (FLEXERIL) 5 MG tablet Take 1 tablet (5 mg total) by mouth 3 (three) times daily as needed for muscle spasms. (Patient not taking: Reported on 04/02/2019) 15 tablet 0   . fluticasone (FLONASE) 50 MCG/ACT nasal spray Place 2 sprays into both nostrils daily.     . medroxyPROGESTERone (DEPO-PROVERA) 150 MG/ML injection Inject 150 mg into the muscle every 3 (three) months.     . metroNIDAZOLE (FLAGYL) 500 MG tablet Take 1 tablet (500 mg total) by mouth 3 (three) times daily. (Patient not taking: Reported on 04/02/2019) 21 tablet 0   . ondansetron (ZOFRAN ODT) 4 MG disintegrating tablet Take 1 tablet (4 mg total) by mouth every 8 (eight) hours as needed for nausea or vomiting. (Patient not taking: Reported on 04/02/2019) 20 tablet 0     Patient Stressors: Marital or family conflict  Patient Strengths: Active sense of humor Average or above average intelligence Capable of independent living Communication skills  Treatment Modalities: Medication Management, Group therapy, Case management,  1 to 1 session with clinician, Psychoeducation, Recreational therapy.   Physician Treatment Plan for Primary Diagnosis: <principal problem not specified> Long Term Goal(s):     Short Term Goals:    Medication Management: Evaluate patient's response, side effects, and tolerance of medication regimen.  Therapeutic Interventions: 1 to 1 sessions, Unit Group sessions and Medication administration.  Evaluation of Outcomes: Not Met  Physician Treatment  Plan for Secondary Diagnosis: Active Problems:   PTSD (post-traumatic stress disorder)  Long Term Goal(s):     Short Term Goals:       Medication Management: Evaluate patient's response, side effects, and tolerance of medication regimen.  Therapeutic Interventions: 1 to 1 sessions, Unit Group sessions and Medication administration.  Evaluation of Outcomes: Not Met   RN Treatment Plan for Primary Diagnosis: <principal problem not specified> Long Term Goal(s): Knowledge of disease and therapeutic regimen to maintain health will improve  Short Term Goals: Ability to participate in decision making will improve, Ability to verbalize feelings will improve, Ability to disclose and discuss suicidal ideas, Ability to identify and develop effective coping behaviors will improve and Compliance with prescribed medications will improve  Medication Management: RN will administer medications as ordered by provider, will assess and evaluate patient's response and provide education to patient for prescribed medication. RN will report any adverse and/or side effects to prescribing provider.  Therapeutic Interventions: 1 on 1 counseling sessions, Psychoeducation, Medication administration, Evaluate responses to treatment, Monitor vital signs and CBGs as ordered, Perform/monitor CIWA, COWS, AIMS and Fall Risk screenings as ordered, Perform wound care treatments as ordered.  Evaluation of Outcomes: Not Met   LCSW Treatment Plan for Primary Diagnosis: <principal problem not specified> Long Term Goal(s): Safe transition to appropriate next level of care at discharge, Engage patient in therapeutic group addressing interpersonal concerns.  Short Term Goals: Engage patient in aftercare planning with referrals and resources  Therapeutic Interventions: Assess for all discharge needs, 1 to 1 time with Social worker, Explore available resources and support systems, Assess for adequacy in community support network,  Educate family and significant other(s) on suicide prevention, Complete Psychosocial  Assessment, Interpersonal group therapy.  Evaluation of Outcomes: Not Met   Progress in Treatment: Attending groups: No. Participating in groups: No. Taking medication as prescribed: Yes. Toleration medication: Yes. Family/Significant other contact made: Yes, individual(s) contacted:  Fuller Song, caretaker Patient understands diagnosis: Yes. Discussing patient identified problems/goals with staff: Yes. Medical problems stabilized or resolved: No. Denies suicidal/homicidal ideation: Yes. Issues/concerns per patient self-inventory: No. Other: NA  New problem(s) identified: No, Describe:  none reported  New Short Term/Long Term Goal(s): Attend outpatient treatment, take medication as prescribed, develop and implement healthy coping methods to manage stress  Patient Goals:  "I dont know why Im here, I didn't try to hurt myself"  Discharge Plan or Barriers: Pt will return home and receive outpatient treatment. Caretaker states she will allow pt to return to her home.  Reason for Continuation of Hospitalization: Medication stabilization  Estimated Length of Stay: 3-5 days  Recreational Therapy: Patient Stressors: N/A Patient Goal: Patient will engage in groups without prompting or encouragement from LRT x3 group sessions within 5 recreation therapy group sessions  Attendees: Patient:Carmen Black 04/03/2019 11:32 AM  Physician: Alethia Berthold 04/03/2019 11:32 AM  Nursing:  04/03/2019 11:32 AM  RN Care Manager: 04/03/2019 11:32 AM  Social Worker: Anise Salvo 04/03/2019 11:32 AM  Recreational Therapist: Isaias Sakai Therma Lasure 04/03/2019 11:32 AM  Other:  04/03/2019 11:32 AM  Other:  04/03/2019 11:32 AM  Other: 04/03/2019 11:32 AM    Scribe for Treatment Team: Yvette Rack, LCSW 04/03/2019 11:32 AM
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
## 2019-04-03 NOTE — Progress Notes (Signed)
Recreation Therapy Notes  INPATIENT RECREATION THERAPY ASSESSMENT  Patient Details Name: Carmen Black MRN: [id number] DOB: [birth year] Today's Date: 04/03/2019       Information Obtained From: Patient(Patient refused)  Able to Participate in Assessment/Interview:    Patient Presentation:    Reason for Admission (Per Patient): Substance Abuse, Patient Unable to Identify  Patient Stressors:    Coping Skills:      Leisure Interests (2+):     Frequency of Recreation/Participation:    Awareness of Community Resources:     [employer]:     Current Use:    If no, Barriers?:    Expressed Interest in [place] of Residence:     Patient Main Form of Transportation:    Patient Strengths:     Patient Identified Areas of Improvement:     Patient Goal for Hospitalization:  I dont know what I am here  Current SI (including self-harm):     Current HI:     Current AVH:    Staff Intervention Plan: Group Attendance, Collaborate with Interdisciplinary Treatment Team  Consent to Intern Participation: N/A  Kamisha Ell 04/03/2019, 11:40 AM

## 2019-04-03 NOTE — Progress Notes (Signed)
Pt received AVS, transition.record, suicide risk assessment,, Pt had no belongings. Pt denies SI. HI. and AVH. Pt was educated on care plan and verbalizes understanding. Collier Bullock RN
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
## 2019-04-03 NOTE — Discharge Summary (Signed)
Physician Discharge Summary Note  Patient:  Carmen Black is an 18 y.o., female MRN:  [id number] DOB:  2001-02-17 Patient phone:  [phone] (home)  Patient address:   [place] [address],  Total Time spent with patient: 45 minutes  Date of Admission:  04/02/2019 Date of Discharge: April 03, 2019  Reason for Admission: Admitted through the emergency room where she presented 2 days ago having taken an excessive amount of drugs with reports that she may have had suicidal ideation.  Principal Problem: PTSD (post-traumatic stress disorder) Discharge Diagnoses: Principal Problem:   PTSD (post-traumatic stress disorder) Active Problems:   Major depressive disorder, recurrent episode, moderate (HCC)   Substance induced mood disorder (HCC)   Ecstasy poisoning ([place])   Past Psychiatric History: Patient has [facility] she was 15 [facility]  Had a suicide attempt at that time.  Was prescribed Abilify but was never compliant with outpatient treatment.  Past Medical History:  Past Medical History:  Diagnosis Date  . ADHD (attention deficit hyperactivity disorder)   . Anxiety   . 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 Depression   . Headache    History reviewed. No pertinent surgical history. Family History: History reviewed. No pertinent family history. Family Psychiatric  History: Reports that her mother has had mood instability and possibly suicidal threats as well Social History:  Social History   Substance and Sexual Activity  Alcohol Use Yes     Social History   Substance and Sexual Activity  Drug Use Yes    Social History   Socioeconomic History  . Marital status: Single    Spouse name: Not on file  . Number of children: Not on file  . Years of education: Not on file  . Highest education level: Not on file  Occupational History  . Not on file  Social Needs  . Financial resource strain: Not on file  . Food  insecurity    Worry: Not on file    Inability: Not on file  . Transportation needs    Medical: Not on file    Non-medical: Not on file  Tobacco Use  . Smoking status: Former Research scientist (life sciences)  . Smokeless tobacco: Never Used  Substance and Sexual Activity  . Alcohol use: Yes  . Drug use: Yes  . Sexual activity: Yes    Birth control/protection: Pill, Injection  Lifestyle  . Physical activity    Days per week: Not on file    Minutes per session: Not on file  . Stress: Not on file  Relationships  . Social Herbalist on phone: Not on file    Gets together: Not on file    Attends religious service: Not on file    Active member of club or organization: Not on file    Attends meetings of clubs or organizations: Not on file    Relationship status: Not on file  Other Topics Concern  . Not on file  Social History Narrative  . Not on file    Hospital Course: Patient admitted to the psychiatric ward.  She is calm and appropriate here on the unit.  Did not display any dangerous violent or aggressive behavior.  Has denied any suicidal ideation.  Patient is showing reasonably good insight into her chronic mood instability her history of trauma and the problems with her substance abuse.  She insists that there was nothing suicidal about her use of drugs recently and  admits that it was a mistake.  Patient does report having a type of hallucination occasionally but there is no delusion associated with it and I do not think it represents a true psychotic hallucination so much as a dissociation or simply an internal voice.  Patient is calm in her behavior.  She expresses a willingness and in fact a desire to get engaged with therapy.  Right now there does not seem to be any clear symptom profile to direct medication 2.  Patient was counseled about the dangers of substance abuse as a way to deal with stresses.  She will be referred to outpatient mental health treatment.  She is requesting discharge and no  longer meets commitment criteria.  Patient is currently staying with the mother of a friend.  That person called at the time of discharge and wanted to speak with me.  Patient denied permission to speak with her stating that she feels that she can take care of herself if necessary.  Patient will be discharged today no prescriptions given.  Physical Findings: AIMS: Facial and Oral Movements Muscles of Facial Expression: None, normal Lips and Perioral Area: None, normal Jaw: None, normal Tongue: None, normal,Extremity Movements Upper (arms, wrists, hands, fingers): None, normal Lower (legs, knees, ankles, toes): None, normal, Trunk Movements Neck, shoulders, hips: None, normal, Overall Severity Severity of abnormal movements (highest score from questions above): None, normal Incapacitation due to abnormal movements: None, normal Patient's awareness of abnormal movements (rate only patient's report): No Awareness, Dental Status Current problems with teeth and/or dentures?: No Does patient usually wear dentures?: No  CIWA:    COWS:     Musculoskeletal: Strength & Muscle Tone: within normal limits Gait & Station: normal Patient leans: N/A  Psychiatric Specialty Exam: Physical Exam  Nursing note and vitals reviewed. Constitutional: She appears well-developed and well-nourished.  HENT:  Head: Normocephalic and atraumatic.  Eyes: Pupils are equal, round, and reactive to light. Conjunctivae are normal.  Neck: Normal range of motion.  Cardiovascular: Regular rhythm and normal heart sounds.  Respiratory: Effort normal.  GI: Soft.  Musculoskeletal: Normal range of motion.  Neurological: She is alert.  Skin: Skin is warm and dry.  Psychiatric: She has a normal mood and affect. Her speech is normal and behavior is normal. Judgment and thought content normal. Cognition and memory are normal.    Review of Systems  Constitutional: Negative.   HENT: Negative.   Eyes: Negative.    Respiratory: Negative.   Cardiovascular: Negative.   Gastrointestinal: Negative.   Musculoskeletal: Negative.   Skin: Negative.   Neurological: Negative.   Psychiatric/Behavioral: Negative.     Blood pressure 104/74, pulse 95, temperature 98.4 F (36.9 C), temperature source Oral, resp. rate 15, height 5\' 2"  (1.575 m), weight 59 kg, SpO2 99 %.Body mass index is 23.78 kg/m.  General Appearance: Casual  Eye Contact:  Good  Speech:  Clear and Coherent  Volume:  Normal  Mood:  Euthymic  Affect:  Congruent  Thought Process:  Goal Directed  Orientation:  Negative  Thought Content:  Logical  Suicidal Thoughts:  No  Homicidal Thoughts:  No  Memory:  Immediate;   Fair Recent;   Fair Remote;   Fair  Judgement:  Fair  Insight:  Fair  Psychomotor Activity:  Normal  Concentration:  Concentration: Fair  Recall:  FiservFair  Fund of Knowledge:  Fair  Language:  Fair  Akathisia:  No  Handed:  Right  AIMS (if indicated):     Assets:  Desire for Improvement Physical Health Resilience  ADL's:  Intact  Cognition:  WNL  Sleep:  Number of Hours: 5.5     Have you used any form of tobacco in the last 30 days? (Cigarettes, Smokeless Tobacco, Cigars, and/or Pipes): No  Has this patient used any form of tobacco in the last 30 days? (Cigarettes, Smokeless Tobacco, Cigars, and/or Pipes) Yes, No  Blood Alcohol level:  Lab Results  Component Value Date   ETH <10 04/01/2019   ETH 7 (H) 09/04/2015    Metabolic Disorder Labs:  Lab Results  Component Value Date   HGBA1C 5.4 09/07/2015   MPG 108 09/07/2015   No results found for: PROLACTIN No results found for: CHOL, TRIG, HDL, CHOLHDL, VLDL, LDLCALC  See Psychiatric Specialty Exam and Suicide Risk Assessment completed by Attending Physician prior to discharge.  Discharge destination:  Home  Is patient on multiple antipsychotic therapies at discharge:  No   Has Patient had three or more failed trials of antipsychotic monotherapy by  history:  No  Recommended Plan for Multiple Antipsychotic Therapies: NA  Discharge Instructions    Diet - low sodium heart healthy   Complete by: As directed    Increase activity slowly   Complete by: As directed      Allergies as of 04/03/2019   No Known Allergies     Medication List    STOP taking these medications   cyclobenzaprine 5 MG tablet Commonly known as: FLEXERIL   fluticasone 50 MCG/ACT nasal spray Commonly known as: FLONASE   medroxyPROGESTERone 150 MG/ML injection Commonly known as: DEPO-PROVERA   metroNIDAZOLE 500 MG tablet Commonly known as: FLAGYL   ondansetron 4 MG disintegrating tablet Commonly known as: Zofran ODT      Follow-up Information    [facility] Follow up.   Why: Please follow up with [facility] on Friday, July 10th at 10:00am.  This will be a telephone appointment.  Contact information: [address] [place] [address] [phone]           Follow-up recommendations:  Activity:  Activity as tolerated Diet:  Regular diet Other:  Follow-up with [facility] or similar mental health services as recommended.  Comments: Patient at this time is calm and appropriate.  Not showing any signs of acute dangerousness or being out of control.  Agrees to outpatient treatment referral.  Signed: Mordecai RasmussenJohn Shyleigh Daughtry, MD 04/03/2019, 3:22 PM
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
## 2019-05-18 ENCOUNTER — Encounter: Payer: Self-pay | Admitting: Emergency Medicine

## 2019-05-18 ENCOUNTER — Other Ambulatory Visit: Payer: Self-pay

## 2019-05-18 ENCOUNTER — Emergency Department
Admission: EM | Admit: 2019-05-18 | Discharge: 2019-05-18 | Disposition: A | Discharge status: Home / Self Care | Payer: Medicaid Other | Attending: Emergency Medicine | Admitting: Emergency Medicine

## 2019-05-18 DIAGNOSIS — Z87891 Personal history of nicotine dependence: Secondary | ICD-10-CM | POA: Insufficient documentation

## 2019-05-18 DIAGNOSIS — N390 Urinary tract infection, site not specified: Secondary | ICD-10-CM | POA: Insufficient documentation

## 2019-05-18 DIAGNOSIS — R1030 Lower abdominal pain, unspecified: Secondary | ICD-10-CM

## 2019-05-18 LAB — URINALYSIS, COMPLETE (UACMP) WITH MICROSCOPIC
Bilirubin Urine: NEGATIVE
Glucose, UA: NEGATIVE mg/dL
Ketones, ur: NEGATIVE mg/dL
Nitrite: NEGATIVE
Protein, ur: NEGATIVE mg/dL
Specific Gravity, Urine: 1.024 (ref 1.005–1.030)
pH: 5 (ref 5.0–8.0)

## 2019-05-18 LAB — COMPREHENSIVE METABOLIC PANEL
ALT: 19 U/L (ref 0–44)
AST: 22 U/L (ref 15–41)
Albumin: 4.2 g/dL (ref 3.5–5.0)
Alkaline Phosphatase: 64 U/L (ref 38–126)
Anion gap: 8 (ref 5–15)
BUN: 18 mg/dL (ref 6–20)
CO2: 23 mmol/L (ref 22–32)
Calcium: 9.4 mg/dL (ref 8.9–10.3)
Chloride: 106 mmol/L (ref 98–111)
Creatinine, Ser: 0.8 mg/dL (ref 0.44–1.00)
GFR calc Af Amer: >60 mL/min (ref >60)
GFR calc non Af Amer: >60 mL/min (ref >60)
Glucose, Bld: 63 mg/dL — ABNORMAL LOW (ref 70–99)
Potassium: 3.6 mmol/L (ref 3.5–5.1)
Sodium: 137 mmol/L (ref 135–145)
Total Bilirubin: 0.5 mg/dL (ref 0.3–1.2)
Total Protein: 7 g/dL (ref 6.5–8.1)

## 2019-05-18 LAB — CBC
HCT: 41.2 % (ref 36.0–46.0)
Hemoglobin: 13.8 g/dL (ref 12.0–15.0)
MCH: 30.6 pg (ref 26.0–34.0)
MCHC: 33.5 g/dL (ref 30.0–36.0)
MCV: 91.4 fL (ref 80.0–100.0)
Platelets: 255 10*3/uL (ref 150–400)
RBC: 4.51 MIL/uL (ref 3.87–5.11)
RDW: 12.1 % (ref 11.5–15.5)
WBC: 9.1 10*3/uL (ref 4.0–10.5)
nRBC: 0 % (ref 0.0–0.2)

## 2019-05-18 LAB — POCT PREGNANCY, URINE: Preg Test, Ur: NEGATIVE

## 2019-05-18 LAB — LIPASE, BLOOD: Lipase: 33 U/L (ref 11–51)

## 2019-05-18 MED ORDER — CEPHALEXIN 500 MG PO CAPS
500.0000 mg | ORAL_CAPSULE | Freq: Once | ORAL | Status: AC
  Administered 2019-05-18: 21:00:00 500 mg via ORAL
  Filled 2019-05-18: qty 1

## 2019-05-18 MED ORDER — CEPHALEXIN 500 MG PO CAPS: 500.0000 mg | ORAL_CAPSULE | Freq: Two times a day (BID) | ORAL | 0 refills | Status: DC

## 2019-05-18 NOTE — ED Provider Notes (Signed)
[facility] Emergency Department Provider Note   ____________________________________________    I have reviewed the triage vital signs and the nursing notes.   HISTORY  Chief Complaint Abdominal Pain and Back Pain (lower)     HPI Carmen Black is a 18 y.o. female who presents with complaints of pain just below her bellybutton.  Patient reports a pulling discomfort in the region which seems to be worse when urinating.  She denies fevers or chills.  No nausea or vomiting.  Is not take anything for this.  Denies vaginal discharge.  No hematuria.  Has noticed some dysuria.   Past Medical History:  Diagnosis Date  . ADHD (attention deficit hyperactivity disorder)   . Anxiety   . Compulsive behavior disorder (HCC)   . Depression   . Headache     Patient Active Problem List   Diagnosis Date Noted  . Substance induced mood disorder (HCC) 04/03/2019  . 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 PTSD (post-traumatic stress disorder) 04/02/2019  . ODD (oppositional defiant disorder) 09/05/2015  . Major depressive disorder, recurrent episode, moderate (HCC) 09/04/2015  . Acne vulgaris 08/15/2015  . ADD (attention deficit disorder) 04/20/2014    History reviewed. No pertinent surgical history.  Prior to Admission medications   Medication Sig Start Date End Date Taking? Authorizing Provider  cephALEXin (KEFLEX) 500 MG capsule Take 1 capsule (500 mg total) by mouth 2 (two) times daily. 05/18/19   Jene EveryKinner, Navy Rothschild, MD     Allergies Patient has no known allergies.  History reviewed. No pertinent family history.  Social History Social History   Tobacco Use  . Smoking status: Former Games developermoker  . Smokeless tobacco: Never Used  Substance Use Topics  . Alcohol use: Yes  . Drug use: Yes    Types: Marijuana    Review of Systems  Constitutional: No fever/chills Eyes: No visual changes.  ENT: No sore throat. Cardiovascular: Denies chest pain.  Respiratory: Denies shortness of breath. Gastrointestinal: As above Genitourinary: As above Musculoskeletal: Negative for back pain. Skin: Negative for rash. Neurological: Negative for headaches or weakness   ____________________________________________   PHYSICAL EXAM:  VITAL SIGNS: ED Triage Vitals  Enc Vitals Group     BP 05/18/19 1800 120/70     Pulse Rate 05/18/19 1800 86     Resp 05/18/19 1800 20     Temp 05/18/19 1800 99 F (37.2 C)     Temp Source 05/18/19 1800 Oral     SpO2 05/18/19 1800 100 %     Weight 05/18/19 1800 65.8 kg (145 lb)     Height 05/18/19 1800 1.575 m (5\' 2" )     Head Circumference --      Peak Flow --      Pain Score 05/18/19 1811 7     Pain Loc --      Pain Edu? --      Excl. in [place]? --     Constitutional: Alert and oriented.   Nose: No congestion/rhinnorhea.  Cardiovascular: Normal rate, regular rhythm. Grossly normal heart sounds.  Good peripheral circulation. Respiratory: Normal respiratory effort.  No retractions. Lungs CTAB. Gastrointestinal: Mild suprapubic tenderness to palpation. No distention.  No CVA tenderness.  Musculoskeletal:  Warm and well perfused Neurologic:  Normal speech and language. No gross focal neurologic deficits are appreciated.  Skin:  Skin is warm, dry and intact. No rash noted. Psychiatric: Mood and affect are normal. Speech and behavior are normal.  ____________________________________________   LABS (all  labs ordered are listed, but only abnormal results are displayed)  Labs Reviewed  COMPREHENSIVE METABOLIC PANEL - Abnormal; Notable for the following components:      Result Value   Glucose, Bld 63 (*)    All other components within normal limits  URINALYSIS, COMPLETE (UACMP) WITH MICROSCOPIC - Abnormal; Notable for the following components:   Color, Urine YELLOW (*)    APPearance HAZY (*)    Hgb urine dipstick LARGE (*)    Leukocytes,Ua MODERATE (*)    Bacteria, UA RARE (*)    All other components  within normal limits  LIPASE, BLOOD  CBC  POCT PREGNANCY, URINE  POC URINE PREG, ED   ____________________________________________  EKG  None ____________________________________________  RADIOLOGY  None ____________________________________________   PROCEDURES  Procedure(s) performed: No  Procedures   Critical Care performed: No ____________________________________________   INITIAL IMPRESSION / ASSESSMENT AND PLAN / ED COURSE  Pertinent labs & imaging results that were available during my care of the patient were reviewed by me and considered in my medical decision making (see chart for details).  Patient presents with mild suprapubic discomfort, urinalysis suspicious for urinary tract infection which would also explain her dysuria.  Lab work is overall unremarkable.  Overall reassuring exam with normal vitals.  We will start her on Keflex, counseled her that if pain worsens she is to return to the emergency department.    ____________________________________________   FINAL CLINICAL IMPRESSION(S) / ED DIAGNOSES  Final diagnoses:  Lower urinary tract infectious disease  Lower abdominal pain        Note:  This document was prepared using Dragon voice recognition software and may include unintentional dictation errors.   Lavonia Drafts, MD 05/18/19 2114

## 2019-05-18 NOTE — ED Triage Notes (Addendum)
Patient c/o abd pain behind belly button, bloating, and lower back pain. C/o when she coughs there is a hard pull on her stomach. Patient reports being on depo X3 years and has never had a period but recently started period X3 days ago. Denies abnormal discharge. Reports a strong urine smell

## 2019-05-20 ENCOUNTER — Emergency Department: Payer: Medicaid Other

## 2019-05-20 ENCOUNTER — Emergency Department
Admission: EM | Admit: 2019-05-20 | Discharge: 2019-05-20 | Disposition: A | Discharge status: Home / Self Care | Payer: Medicaid Other | Attending: Emergency Medicine | Admitting: Emergency Medicine

## 2019-05-20 ENCOUNTER — Other Ambulatory Visit: Payer: Self-pay

## 2019-05-20 DIAGNOSIS — Z79899 Other long term (current) drug therapy: Secondary | ICD-10-CM | POA: Insufficient documentation

## 2019-05-20 DIAGNOSIS — Z87891 Personal history of nicotine dependence: Secondary | ICD-10-CM | POA: Diagnosis not present

## 2019-05-20 DIAGNOSIS — F909 Attention-deficit hyperactivity disorder, unspecified type: Secondary | ICD-10-CM | POA: Insufficient documentation

## 2019-05-20 DIAGNOSIS — R1033 Periumbilical pain: Secondary | ICD-10-CM | POA: Diagnosis present

## 2019-05-20 DIAGNOSIS — R109 Unspecified abdominal pain: Secondary | ICD-10-CM | POA: Insufficient documentation

## 2019-05-20 DIAGNOSIS — R110 Nausea: Secondary | ICD-10-CM | POA: Insufficient documentation

## 2019-05-20 LAB — COMPREHENSIVE METABOLIC PANEL WITH GFR
ALT: 20 U/L (ref 0–44)
AST: 21 U/L (ref 15–41)
Albumin: 4.7 g/dL (ref 3.5–5.0)
Alkaline Phosphatase: 62 U/L (ref 38–126)
Anion gap: 7 (ref 5–15)
BUN: 11 mg/dL (ref 6–20)
CO2: 24 mmol/L (ref 22–32)
Calcium: 9.6 mg/dL (ref 8.9–10.3)
Chloride: 105 mmol/L (ref 98–111)
Creatinine, Ser: 0.74 mg/dL (ref 0.44–1.00)
GFR calc Af Amer: >60 mL/min
GFR calc non Af Amer: >60 mL/min
Glucose, Bld: 86 mg/dL (ref 70–99)
Potassium: 3.5 mmol/L (ref 3.5–5.1)
Sodium: 136 mmol/L (ref 135–145)
Total Bilirubin: 0.6 mg/dL (ref 0.3–1.2)
Total Protein: 7.5 g/dL (ref 6.5–8.1)

## 2019-05-20 LAB — URINALYSIS, COMPLETE (UACMP) WITH MICROSCOPIC
Bilirubin Urine: NEGATIVE
Glucose, UA: NEGATIVE mg/dL
Ketones, ur: 20 mg/dL — AB
Nitrite: NEGATIVE
Protein, ur: 30 mg/dL — AB
Specific Gravity, Urine: 1.019 (ref 1.005–1.030)
pH: 6 (ref 5.0–8.0)

## 2019-05-20 LAB — CBC
HCT: 42.9 % (ref 36.0–46.0)
Hemoglobin: 14.4 g/dL (ref 12.0–15.0)
MCH: 30.3 pg (ref 26.0–34.0)
MCHC: 33.6 g/dL (ref 30.0–36.0)
MCV: 90.3 fL (ref 80.0–100.0)
Platelets: 275 K/uL (ref 150–400)
RBC: 4.75 MIL/uL (ref 3.87–5.11)
RDW: 11.9 % (ref 11.5–15.5)
WBC: 7.9 K/uL (ref 4.0–10.5)
nRBC: 0 % (ref 0.0–0.2)

## 2019-05-20 LAB — POCT PREGNANCY, URINE: Preg Test, Ur: NEGATIVE

## 2019-05-20 LAB — LIPASE, BLOOD: Lipase: 39 U/L (ref 11–51)

## 2019-05-20 LAB — WET PREP, GENITAL
Clue Cells Wet Prep HPF POC: NONE SEEN
Sperm: NONE SEEN
Trich, Wet Prep: NONE SEEN
Yeast Wet Prep HPF POC: NONE SEEN

## 2019-05-20 MED ORDER — NAPROXEN 500 MG PO TABS: 500.0000 mg | ORAL_TABLET | Freq: Two times a day (BID) | ORAL | 0 refills | Status: AC

## 2019-05-20 MED ORDER — AZITHROMYCIN 500 MG PO TABS
1000.0000 mg | ORAL_TABLET | Freq: Once | ORAL | Status: AC
  Administered 2019-05-20: 1000 mg via ORAL
  Filled 2019-05-20: qty 2

## 2019-05-20 MED ORDER — IOHEXOL 300 MG/ML  SOLN
100.0000 mL | Freq: Once | INTRAMUSCULAR | Status: AC | PRN
  Administered 2019-05-20: 100 mL via INTRAVENOUS

## 2019-05-20 MED ORDER — ONDANSETRON HCL 4 MG/2ML IJ SOLN
4.0000 mg | Freq: Once | INTRAMUSCULAR | Status: AC
  Administered 2019-05-20: 4 mg via INTRAVENOUS
  Filled 2019-05-20: qty 2

## 2019-05-20 MED ORDER — FENTANYL CITRATE (PF) 100 MCG/2ML IJ SOLN
50.0000 ug | Freq: Once | INTRAMUSCULAR | Status: AC
  Administered 2019-05-20: 50 ug via INTRAVENOUS
  Filled 2019-05-20: qty 2

## 2019-05-20 MED ORDER — CEFTRIAXONE SODIUM 250 MG IJ SOLR
250.0000 mg | Freq: Once | INTRAMUSCULAR | Status: AC
  Administered 2019-05-20: 250 mg via INTRAMUSCULAR
  Filled 2019-05-20: qty 250

## 2019-05-20 NOTE — ED Provider Notes (Signed)
[facility] Emergency Department Provider Note  ____________________________________________   First MD Initiated Contact with Patient 05/20/19 1515     (approximate)  I have reviewed the triage vital signs and the nursing notes.   HISTORY  Chief Complaint Abdominal Pain    HPI Carmen Black is a 18 y.o. female with ADHD who presents with worsening abdominal pain.  Patient is having abdominal distention that is new for her that is constant, nothing makes it better, nothing makes it worse onset a few days ago.  She has some redness around her bellybutton with increasing pain that has happened for four days.  She will occasionally have some redness. She said she has been scrubbing the area.  She has started to have some vaginal bleeding which is abnormal for her because she has a Depo shot.  Patient was seen on 8/17 and diagnosed with a possible UTI.  Patient was discharged on antibiotics.  Patient has been taking these but feels that this does not seem related to UTI.          Past Medical History:  Diagnosis Date   ADHD (attention deficit hyperactivity disorder)    Anxiety    Compulsive behavior disorder [facility])    Depression    Headache     Patient Active Problem List   Diagnosis Date Noted   Substance induced mood disorder ([place]) 04/03/2019   Ecstasy poisoning ([place]) 04/03/2019   PTSD (post-traumatic stress disorder) 04/02/2019   ODD (oppositional defiant disorder) 09/05/2015   Major depressive disorder, recurrent episode, moderate ([place]) 09/04/2015   Acne vulgaris 08/15/2015   ADD (attention deficit disorder) 04/20/2014    History reviewed. No pertinent surgical history.  Prior to Admission medications   Medication Sig Start Date End Date Taking? Authorizing Provider  cephALEXin (KEFLEX) 500 MG capsule Take 1 capsule (500 mg total) by mouth 2 (two) times daily. 05/18/19   Lavonia Drafts, MD    Allergies Patient has no known  allergies.  No family history on file.  Social History Social History   Tobacco Use   Smoking status: Former Smoker   Smokeless tobacco: Never Used  Substance Use Topics   Alcohol use: Yes   Drug use: Yes    Types: Marijuana      Review of Systems Constitutional: No fever/chills Eyes: No visual changes. ENT: No sore throat. Cardiovascular: Denies chest pain. Respiratory: Denies shortness of breath. Gastrointestinal:+ abd pain, mild umbilical redness.  No nausea, no vomiting.  No diarrhea.  No constipation. Genitourinary: Negative for dysuria. Musculoskeletal: Negative for back pain. Skin: Negative for rash. Neurological: Negative for headaches, focal weakness or numbness. All other ROS negative ____________________________________________   PHYSICAL EXAM:  VITAL SIGNS: ED Triage Vitals  Enc Vitals Group     BP 05/20/19 1343 (!) 150/84     Pulse Rate 05/20/19 1343 98     Resp 05/20/19 1343 16     Temp 05/20/19 1343 98.7 F (37.1 C)     Temp Source 05/20/19 1343 Oral     SpO2 05/20/19 1343 98 %     Weight 05/20/19 1344 143 lb 4.8 oz (65 kg)     Height 05/20/19 1344 5\' 2"  (1.575 m)     Head Circumference --      Peak Flow --      Pain Score 05/20/19 1343 7     Pain Loc --      Pain Edu? --      Excl. in [place]? --  Constitutional: Alert and oriented. Well appearing and in no acute distress. Eyes: Conjunctivae are normal. EOMI. Head: Atraumatic. Nose: No congestion/rhinnorhea. Mouth/Throat: Mucous membranes are moist.   Neck: No stridor. Trachea Midline. FROM Cardiovascular: Normal rate, regular rhythm. Grossly normal heart sounds.  Good peripheral circulation. Respiratory: Normal respiratory effort.  No retractions. Lungs CTAB. Gastrointestinal: Soft but tender in her epigastric region, with very faint 1cm erythema, No distention. No abdominal bruits.  Musculoskeletal: No lower extremity tenderness nor edema.  No joint effusions. Neurologic:  Normal  speech and language. No gross focal neurologic deficits are appreciated.  Skin:  Skin is warm, dry and intact. No rash noted. Psychiatric: Mood and affect are normal. Speech and behavior are normal. GU: Deferred   ____________________________________________   LABS (all labs ordered are listed, but only abnormal results are displayed)  Labs Reviewed  WET PREP, GENITAL - Abnormal; Notable for the following components:      Result Value   WBC, Wet Prep HPF POC FEW (*)    All other components within normal limits  URINALYSIS, COMPLETE (UACMP) WITH MICROSCOPIC - Abnormal; Notable for the following components:   Color, Urine YELLOW (*)    APPearance HAZY (*)    Hgb urine dipstick LARGE (*)    Ketones, ur 20 (*)    Protein, ur 30 (*)    Leukocytes,Ua SMALL (*)    Bacteria, UA RARE (*)    All other components within normal limits  GC/CHLAMYDIA PROBE AMP  LIPASE, BLOOD  COMPREHENSIVE METABOLIC PANEL  CBC  POC URINE PREG, ED  POCT PREGNANCY, URINE   ____________________________________________  ____________________________________________  RADIOLOGY Vela ProseI, Rosamond Andress E Arlester Keehan, personally viewed and evaluated these images (plain radiographs) as part of my medical decision making, as well as reviewing the written report by the radiologist.   Official radiology report(s): Ct Abdomen Pelvis W Contrast  Result Date: 05/20/2019 CLINICAL DATA:  Reports umbilical pain, abdominal swelling X 3 days. Dx with UTI X2 days ago and has been compliant with them. Denies NVD. Last BM was last night WNL. Reports vaginal bleeding as well. 100ml Omni 300^14500mL OMNIPAQUE IOHEXOL 300 MG/ML SOLN EXAM: CT ABDOMEN AND PELVIS WITH CONTRAST TECHNIQUE: Multidetector CT imaging of the abdomen and pelvis was performed using the standard protocol following bolus administration of intravenous contrast. CONTRAST:  100mL OMNIPAQUE IOHEXOL 300 MG/ML  SOLN COMPARISON:  None. FINDINGS: Lower chest: No acute abnormality.  Hepatobiliary: No focal liver abnormality is seen. No gallstones, gallbladder wall thickening, or biliary dilatation. Pancreas: Unremarkable. No pancreatic ductal dilatation or surrounding inflammatory changes. Spleen: Normal in size without focal abnormality. Adrenals/Urinary Tract: Adrenal glands are unremarkable. Kidneys are normal, without renal calculi, focal lesion, or hydronephrosis. Bladder is unremarkable. Stomach/Bowel: Stomach is within normal limits. The appendix is not well visualized on today's study but there are no inflammatory changes to suggest appendicitis. No evidence of bowel wall thickening, distention, or inflammatory changes. Vascular/Lymphatic: No significant vascular findings are present. No enlarged abdominal or pelvic lymph nodes. Reproductive: Uterus and bilateral adnexa are unremarkable. Other: No abdominal wall hernia or abnormality. No abdominopelvic ascites. Musculoskeletal: No acute or significant osseous findings. IMPRESSION: No CT finding to explain the patient's abdominal pain. Electronically Signed   By: Emmaline KluverNancy  Ballantyne M.D.   On: 05/20/2019 16:12    ____________________________________________   PROCEDURES  Procedure(s) performed (including Critical Care):  Procedures   ____________________________________________   INITIAL IMPRESSION / ASSESSMENT AND PLAN / ED COURSE  Carmen Black was evaluated in Emergency Department on 05/20/2019  for the symptoms described in the history of present illness. She was evaluated in the context of the global COVID-19 pandemic, which necessitated consideration that the patient might be at risk for infection with the SARS-CoV-2 virus that causes COVID-19. Institutional protocols and algorithms that pertain to the evaluation of patients at risk for COVID-19 are in a state of rapid change based on information released by regulatory bodies including the CDC and federal and state organizations. These policies and algorithms were  followed during the patient's care in the ED.    Unclear cause of abd pain and bloating and very minimal to none redness.  Will get CT imaging to rule out appendicitis, sbo, abscess.  Redness very minimal and not warm so unlikely cellulitis. Most likely from pt trying to rub area  And has been cleaning it excessively in the shower.  Pt with new abnormal vag bleed.  Preg test negative. Will get TV US to make sure no concern pathology like fibroids.  Pelvic exam with minimal blood, no CMT tenderness.  CT negative  TV negative   Pt is still on her anitbiotics. Would like rx for STDs.  Will give dose Cef/aizthro.  D/w pt imaging results.  Unclear what is causing the bloating sensation. Discussed doing a food journal.  Unclear what is causing the minimal redness at belly button.  Encouraged not to cleaning it excessively and taking naproxen/tylenol for pain and to continue monitoring area.  Pt will come pt if redness is spreading or any other concerns.        ____________________________________________   FINAL CLINICAL IMPRESSION(S) / ED DIAGNOSES   Final diagnoses:  Abdominal pain  Nausea      MEDICATIONS GIVEN DURING THIS VISIT:  Medications  fentaNYL (SUBLIMAZE) injection 50 mcg (has no administration in time range)  ondansetron (ZOFRAN) injection 4 mg (has no administration in time range)     ED Discharge Orders         Ordered    naproxen (NAPROSYN) 500 MG tablet  2 times daily with meals     05/20/19 2047           Note:  This document was prepared using Dragon voice recognition software and may include unintentional dictation errors.   Concha SeFunke, Kamalani Mastro E, MD 05/20/19 2233

## 2019-05-20 NOTE — ED Notes (Signed)
IV D/C by this tech. RN approved. Meal tray and sprite given to pt.

## 2019-05-20 NOTE — ED Triage Notes (Signed)
Reports umbilical pain, abdominal swelling X 3 days. Dx with UTI X2 days ago and has been compliant with them. Denies NVD. Last BM was last night WNL. Reports vaginal bleeding as well. Pt alert and oriented X4, cooperative, RR even and unlabored, color WNL. Pt in NAD.

## 2019-05-20 NOTE — Discharge Instructions (Signed)
Your CT and TV US was normal.  You should finish the antibiotics for the UTI.   Take the naproxen and tylenol for pain.  Return to ER if symptoms worsening, develop new symptoms like fevers, sob, or any other concerns.

## 2019-05-20 NOTE — ED Notes (Signed)
Reports abd pain, with swelling to belly button, seen recently for same c/o sent home with rx, pain is constant has not resolved, worse today.

## 2019-05-21 DIAGNOSIS — N912 Amenorrhea, unspecified: Secondary | ICD-10-CM

## 2019-05-22 ENCOUNTER — Ambulatory Visit: Payer: Medicaid Other

## 2019-05-25 ENCOUNTER — Ambulatory Visit: Payer: Medicaid Other

## 2019-05-25 LAB — GC/CHLAMYDIA PROBE AMP
Chlamydia trachomatis, NAA: NEGATIVE
Neisseria Gonorrhoeae by PCR: NEGATIVE

## 2019-06-03 ENCOUNTER — Encounter: Payer: Self-pay | Admitting: Obstetrics and Gynecology

## 2019-06-03 ENCOUNTER — Other Ambulatory Visit: Payer: Self-pay

## 2019-06-03 ENCOUNTER — Ambulatory Visit (INDEPENDENT_AMBULATORY_CARE_PROVIDER_SITE_OTHER): Payer: Medicaid Other | Admitting: Obstetrics and Gynecology

## 2019-06-03 VITALS — BP 111/68 | HR 73 | Ht 62.0 in | Wt 133.0 lb

## 2019-06-03 DIAGNOSIS — L03316 Cellulitis of umbilicus: Secondary | ICD-10-CM

## 2019-06-03 DIAGNOSIS — Z30016 Encounter for initial prescription of transdermal patch hormonal contraceptive device: Secondary | ICD-10-CM

## 2019-06-03 MED ORDER — XULANE 150-35 MCG/24HR TD PTWK: 1.0000 | MEDICATED_PATCH | TRANSDERMAL | 12 refills | Status: DC

## 2019-06-03 MED ORDER — CEPHALEXIN 500 MG PO CAPS: 500.0000 mg | ORAL_CAPSULE | Freq: Two times a day (BID) | ORAL | 0 refills | Status: DC

## 2019-06-03 NOTE — Telephone Encounter (Signed)
You saw pt this morning. Please advise

## 2019-06-03 NOTE — Progress Notes (Signed)
Obstetrics & Gynecology Office Visit   Chief Complaint:  Chief Complaint  Patient presents with  . Pelvic Pain    bellybutton pain x3 weeks/bleeding  . ER follow up    History of Present Illness: 18 y.o. G1P0010 presenting for ER follow up of umbilical pain.  It is unclear why gynecology was chosed as her follow up given that she was seen 05/18/2019 for UTI treated with Keflex and 05/20/2019 both presentation prompted by periumbilical abdominal pain.  She had a negative urine pregnancy test, negative CT abdomen and pelvis 05/20/2019 as well as normal TVUS 05/20/2019.  GC/CT cultures were negative 05/20/2019.  Reports continued umbilical pain and discomfort.  Patient took pictures at the time of initial presentation which shows marked and sharply demarcated periumbilical erythema consistent with possible cellulitis.  She did have some unscheduled break through bleeding but we discussed that this may occur on depo provera.  She has been on depo provera for contraception administered at [facility].  She is interested in changing to a different contraceptive.     Review of Systems: Review of Systems  Constitutional: Negative.   Gastrointestinal: Positive for abdominal pain and nausea. Negative for vomiting.  Skin: Negative.      Past Medical History:  Past Medical History:  Diagnosis Date  . ADHD (attention deficit hyperactivity disorder)   . Anxiety   . Compulsive behavior disorder ([place])   . Depression   . Headache     Past Surgical History:  Past Surgical History:  Procedure Laterality Date  . NO PAST SURGERIES      Gynecologic History: Patient's last menstrual period was 05/18/2019.  Obstetric History: G1P0010  Family History:  Family History  Problem Relation Age of Onset  . Diabetes Mother   . Hypertension Mother   . Bipolar disorder Mother   . Deep vein thrombosis Mother   . Migraines Mother   . Seizures Mother   . Colon cancer Father   . Schizophrenia Paternal Aunt    . Clotting disorder Paternal Aunt        blood clots in legs  . Heart disease Maternal Grandmother   . Hepatitis Maternal Grandmother   . Clotting disorder Maternal Grandmother        blood clots in legs  . Depression Half-Brother   . Depression Half-Sister   . Kidney disease Maternal Aunt   . Kidney disease Paternal Uncle   . Hepatitis Paternal Grandmother   . Heart disease Paternal Grandmother     Social History:  Social History   Socioeconomic History  . Marital status: Single    Spouse name: Not on file  . Number of children: Not on file  . Years of education: Not on file  . Highest education level: Not on file  Occupational History  . Not on file  Social Needs  . Financial resource strain: Not on file  . Food insecurity    Worry: Not on file    Inability: Not on file  . Transportation needs    Medical: Not on file    Non-medical: Not on file  Tobacco Use  . Smoking status: Former Research scientist (life sciences)  . Smokeless tobacco: Never Used  Substance and Sexual Activity  . Alcohol use: Yes  . Drug use: Yes    Types: Marijuana  . Sexual activity: Yes    Birth control/protection: Injection  Lifestyle  . Physical activity    Days per week: Not on file    Minutes per session:  Not on file  . Stress: Not on file  Relationships  . Social Musicianconnections    Talks on phone: Not on file    Gets together: Not on file    Attends religious service: Not on file    Active member of club or organization: Not on file    Attends meetings of clubs or organizations: Not on file    Relationship status: Not on file  . Intimate partner violence    Fear of current or ex partner: Not on file    Emotionally abused: Not on file    Physically abused: Not on file    Forced sexual activity: Not on file  Other Topics Concern  . Not on file  Social History Narrative  . Not on file    Allergies:  No Known Allergies  Medications: Prior to Admission medications   Medication Sig Start Date End Date  Taking? Authorizing Provider  naproxen (NAPROSYN) 500 MG tablet Take 1 tablet (500 mg total) by mouth 2 (two) times daily with a meal for 15 days. 05/20/19 06/04/19 Yes Concha SeFunke, Mary E, MD  cephALEXin (KEFLEX) 500 MG capsule Take 1 capsule (500 mg total) by mouth 2 (two) times daily. 06/03/19   Vena AustriaStaebler, Beva Remund, MD  norelgestromin-ethinyl estradiol [employer]r Medico(XULANE) 150-35 MCG/24HR transdermal patch Place 1 patch onto the skin once a week. Leave patch off on week 4 for 1 week 06/03/19   Vena AustriaStaebler, Adianna Darwin, MD    Physical Exam Vitals:  Vitals:   06/03/19 1020  BP: 111/68  Pulse: 73   Patient's last menstrual period was 05/18/2019.  General: NAD, well nourished, appears stated age HEENT: normocephalic, anicteric Pulmonary: no increased work of breathing Abdomen: NABS, soft, non-tender other than tenderness reported around umbilicus, non-distended.  Umbilicus without lesions.  No hepatomegaly, splenomegaly or masses palpable. No evidence of hernia  Neurologic: Grossly intact Psychiatric: mood appropriate, affect full  Female chaperone present for pelvic  portions of the physical exam  Assessment: 18 y.o. G1P0010 with what sounds like cellulitis, as well as contraception consult  Plan: Problem List Items Addressed This Visit    None    Visit Diagnoses    Cellulitis of umbilicus    -  Primary   Encounter for initial prescription of transdermal patch hormonal contraceptive device         1) Cellulitis rx keflex for additional 7 days and 14 days treatment course total  2) Contraception - Rx Xulane   3) Follow up 2 weeks for pap/annual and assess treatment response  Vena AustriaAndreas Alvie Fowles, MD, Merlinda FrederickFACOG [facility], [facility] 06/03/2019, 6:14 PM

## 2019-06-04 MED ORDER — CEPHALEXIN 500 MG PO CAPS: 500.0000 mg | ORAL_CAPSULE | Freq: Two times a day (BID) | ORAL | 0 refills | Status: DC

## 2019-06-09 ENCOUNTER — Other Ambulatory Visit: Payer: Self-pay

## 2019-06-09 DIAGNOSIS — Z20822 Contact with and (suspected) exposure to covid-19: Secondary | ICD-10-CM

## 2019-06-10 ENCOUNTER — Other Ambulatory Visit: Payer: Self-pay

## 2019-06-10 ENCOUNTER — Emergency Department
Admission: EM | Admit: 2019-06-10 | Discharge: 2019-06-10 | Disposition: A | Discharge status: Home / Self Care | Payer: Medicaid Other | Attending: Emergency Medicine | Admitting: Emergency Medicine

## 2019-06-10 ENCOUNTER — Emergency Department: Payer: Medicaid Other

## 2019-06-10 ENCOUNTER — Encounter: Payer: Self-pay | Admitting: Emergency Medicine

## 2019-06-10 DIAGNOSIS — Z87891 Personal history of nicotine dependence: Secondary | ICD-10-CM | POA: Diagnosis not present

## 2019-06-10 DIAGNOSIS — M549 Dorsalgia, unspecified: Secondary | ICD-10-CM | POA: Insufficient documentation

## 2019-06-10 DIAGNOSIS — R0789 Other chest pain: Secondary | ICD-10-CM | POA: Diagnosis present

## 2019-06-10 LAB — COMPREHENSIVE METABOLIC PANEL
ALT: 17 U/L (ref 0–44)
AST: 17 U/L (ref 15–41)
Albumin: 4.3 g/dL (ref 3.5–5.0)
Alkaline Phosphatase: 59 U/L (ref 38–126)
Anion gap: 10 (ref 5–15)
BUN: 21 mg/dL — ABNORMAL HIGH (ref 6–20)
CO2: 20 mmol/L — ABNORMAL LOW (ref 22–32)
Calcium: 9.3 mg/dL (ref 8.9–10.3)
Chloride: 110 mmol/L (ref 98–111)
Creatinine, Ser: 0.71 mg/dL (ref 0.44–1.00)
GFR calc Af Amer: >60 mL/min (ref >60)
GFR calc non Af Amer: >60 mL/min (ref >60)
Glucose, Bld: 96 mg/dL (ref 70–99)
Potassium: 3.8 mmol/L (ref 3.5–5.1)
Sodium: 140 mmol/L (ref 135–145)
Total Bilirubin: 0.4 mg/dL (ref 0.3–1.2)
Total Protein: 6.9 g/dL (ref 6.5–8.1)

## 2019-06-10 LAB — CBC
HCT: 37.6 % (ref 36.0–46.0)
Hemoglobin: 12.8 g/dL (ref 12.0–15.0)
MCH: 30.3 pg (ref 26.0–34.0)
MCHC: 34 g/dL (ref 30.0–36.0)
MCV: 88.9 fL (ref 80.0–100.0)
Platelets: 285 10*3/uL (ref 150–400)
RBC: 4.23 MIL/uL (ref 3.87–5.11)
RDW: 11.9 % (ref 11.5–15.5)
WBC: 8.7 10*3/uL (ref 4.0–10.5)
nRBC: 0 % (ref 0.0–0.2)

## 2019-06-10 MED ORDER — NAPROXEN 500 MG PO TABS: 500.0000 mg | ORAL_TABLET | Freq: Two times a day (BID) | ORAL | 2 refills | Status: DC

## 2019-06-10 NOTE — ED Triage Notes (Signed)
Pt to ER states it hurts to breathe out and that she is "really tired" and dizzy.  PT talking non-stop in run on sentences in triage.  Pt states seen last month for abdominal pain and was ultimately diagnosed with cellulitis.

## 2019-06-10 NOTE — ED Provider Notes (Signed)
[facility] Emergency Department Provider Note   ____________________________________________    I have reviewed the triage vital signs and the nursing notes.   HISTORY  Chief Complaint Back pain   HPI Carmen Black is a 18 y.o. female with a history of anxiety presents with complaints of pain when she exhales in her back.  She has no pain when she inhales.  No shortness of breath.  No fevers or chills or cough.  No nausea or vomiting or diaphoresis.  No sick contacts.  Has not taken anything for this.  Symptoms are ongoing for 1 day, no calf pain or swelling  Past Medical History:  Diagnosis Date  . ADHD (attention deficit hyperactivity disorder)   . Anxiety   . Compulsive behavior disorder (HCC)   . Depression   . Headache     Patient Active Problem List   Diagnosis Date Noted  . Substance induced mood disorder (HCC) 04/03/2019  . 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 PTSD (post-traumatic stress disorder) 04/02/2019  . Amenorrhea 11/18/2018  . ODD (oppositional defiant disorder) 09/05/2015  . Major depressive disorder, recurrent episode, moderate (HCC) 09/04/2015  . Acne vulgaris 08/15/2015  . ADHD (attention deficit hyperactivity disorder) 04/20/2014    Past Surgical History:  Procedure Laterality Date  . NO PAST SURGERIES      Prior to Admission medications   Medication Sig Start Date End Date Taking? Authorizing Provider  cephALEXin (KEFLEX) 500 MG capsule Take 1 capsule (500 mg total) by mouth 2 (two) times daily. 06/04/19   Vena AustriaStaebler, Andreas, MD  naproxen (NAPROSYN) 500 MG tablet Take 1 tablet (500 mg total) by mouth 2 (two) times daily with a meal. 06/10/19   Jene EveryKinner, Kayden Amend, MD  norelgestromin-ethinyl estradiol Burr Medico(XULANE) 150-35 MCG/24HR transdermal patch Place 1 patch onto the skin once a week. Leave patch off on week 4 for 1 week 06/03/19   Vena AustriaStaebler, Andreas, MD     Allergies Patient has no known allergies.  Family History   Problem Relation Age of Onset  . Diabetes Mother   . Hypertension Mother   . Bipolar disorder Mother   . Deep vein thrombosis Mother   . Migraines Mother   . Seizures Mother   . Colon cancer Father   . Schizophrenia Paternal Aunt   . Clotting disorder Paternal Aunt        blood clots in legs  . Heart disease Maternal Grandmother   . Hepatitis Maternal Grandmother   . Clotting disorder Maternal Grandmother        blood clots in legs  . Depression Half-Brother   . Depression Half-Sister   . Kidney disease Maternal Aunt   . Kidney disease Paternal Uncle   . Hepatitis Paternal Grandmother   . Heart disease Paternal Grandmother     Social History Social History   Tobacco Use  . Smoking status: Former Games developermoker  . Smokeless tobacco: Never Used  Substance Use Topics  . Alcohol use: Yes  . Drug use: Yes    Types: Marijuana    Review of Systems  Constitutional: No fever/chills Eyes: No visual changes.  ENT: No sore throat. Cardiovascular: As above Respiratory: Denies shortness of breath. Gastrointestinal: No abdominal pain.   Genitourinary: Negative for dysuria. Musculoskeletal: As above Skin: Negative for rash. Neurological: Negative for headaches or weakness   ____________________________________________   PHYSICAL EXAM:  VITAL SIGNS: ED Triage Vitals [06/10/19 1714]  Enc Vitals Group     BP 120/79  Pulse Rate 76     Resp 18     Temp 98.7 F (37.1 C)     Temp Source Oral     SpO2 99 %     Weight      Height      Head Circumference      Peak Flow      Pain Score 9     Pain Loc      Pain Edu?      Excl. in [place]?     Constitutional: Alert and oriented.  Eyes: Conjunctivae are normal.   Nose: No congestion/rhinnorhea. Mouth/Throat: Mucous membranes are moist.    Cardiovascular: Normal rate, regular rhythm. Grossly normal heart sounds.  Good peripheral circulation. Respiratory: Normal respiratory effort.  No retractions. Lungs CTAB.  Gastrointestinal: Soft and nontender. No distention.  No CVA tenderness. Genitourinary: deferred Musculoskeletal: Patient has point tenderness to the right paraspinal area at approximately T6 triggers her pain when she exhales, no rash or abnormality Neurologic:  Normal speech and language. No gross focal neurologic deficits are appreciated.  Skin:  Skin is warm, dry and intact. Psychiatric: Mood and affect are normal. Speech and behavior are normal.  ____________________________________________   LABS (all labs ordered are listed, but only abnormal results are displayed)  Labs Reviewed  COMPREHENSIVE METABOLIC PANEL - Abnormal; Notable for the following components:      Result Value   CO2 20 (*)    BUN 21 (*)    All other components within normal limits  CBC   ____________________________________________  EKG  None ____________________________________________  RADIOLOGY  Chest x-ray unremarkable ____________________________________________   PROCEDURES  Procedure(s) performed: No  Procedures   Critical Care performed: No ____________________________________________   INITIAL IMPRESSION / ASSESSMENT AND PLAN / ED COURSE  Pertinent labs & imaging results that were available during my care of the patient were reviewed by me and considered in my medical decision making (see chart for details).  Patient presents with likely musculoskeletal back pain, her lab work is unremarkable, chest x-ray is benign.  Will treat with NSAIDs, outpatient follow-up, return precautions discussed    ____________________________________________   FINAL CLINICAL IMPRESSION(S) / ED DIAGNOSES  Final diagnoses:  Chest wall pain        Note:  This document was prepared using Dragon voice recognition software and may include unintentional dictation errors.   Lavonia Drafts, MD 06/10/19 [phone]

## 2019-06-11 LAB — NOVEL CORONAVIRUS, NAA: SARS-CoV-2, NAA: NOT DETECTED

## 2019-06-17 ENCOUNTER — Other Ambulatory Visit: Payer: Self-pay | Admitting: Obstetrics and Gynecology

## 2019-06-17 ENCOUNTER — Other Ambulatory Visit (HOSPITAL_COMMUNITY)
Admission: RE | Admit: 2019-06-17 | Discharge: 2019-06-17 | Disposition: A | Discharge status: Home / Self Care | Payer: Medicaid Other | Source: Ambulatory Visit | Attending: Obstetrics and Gynecology | Admitting: Obstetrics and Gynecology

## 2019-06-17 ENCOUNTER — Ambulatory Visit (INDEPENDENT_AMBULATORY_CARE_PROVIDER_SITE_OTHER): Payer: Medicaid Other | Admitting: Obstetrics and Gynecology

## 2019-06-17 ENCOUNTER — Other Ambulatory Visit: Payer: Self-pay

## 2019-06-17 ENCOUNTER — Encounter: Payer: Self-pay | Admitting: Obstetrics and Gynecology

## 2019-06-17 VITALS — BP 116/69 | HR 84 | Wt 126.0 lb

## 2019-06-17 DIAGNOSIS — Z01419 Encounter for gynecological examination (general) (routine) without abnormal findings: Secondary | ICD-10-CM

## 2019-06-17 DIAGNOSIS — Z113 Encounter for screening for infections with a predominantly sexual mode of transmission: Secondary | ICD-10-CM | POA: Diagnosis present

## 2019-06-17 DIAGNOSIS — Z0000 Encounter for general adult medical examination without abnormal findings: Secondary | ICD-10-CM

## 2019-06-17 DIAGNOSIS — Z3045 Encounter for surveillance of transdermal patch hormonal contraceptive device: Secondary | ICD-10-CM

## 2019-06-17 DIAGNOSIS — Z800 Family history of malignant neoplasm of digestive organs: Secondary | ICD-10-CM

## 2019-06-17 NOTE — Progress Notes (Signed)
Gynecology Annual Exam  PCP: Vena Austria, MD  Chief Complaint:  Chief Complaint  Patient presents with  . Gynecologic Exam    History of Present Illness: Patient is a 18 y.o. G1P0010 presents for annual exam. The patient has no complaints today.   LMP: Patient's last menstrual period was 05/18/2019. Average Interval: regular, 28 days Duration of flow: 5 days Heavy Menses: no Clots: no Intermenstrual Bleeding: no Postcoital Bleeding: no Dysmenorrhea: no  The patient is sexually active. She currently uses Ortho-Evra patches weekly for contraception. There is no notable family history of breast or ovarian cancer in her family.  The patient relays that at 62 months old she was in the hospital secondary to some sort of bowl issue.  Throughout her childhood she had significant issues with bowl movements and her mom had to assist her with a clear plastic tube.  She does not have to use anything for bowl movements as an adult.  Reports that her father was diagnosed with some sort of colon problem at 23 and deceased at 78.  The patient wears seatbelts: yes.  The patient has regular exercise: not asked.    The patient denies current symptoms of depression.    Review of Systems: ROS  Past Medical History:  Past Medical History:  Diagnosis Date  . ADHD (attention deficit hyperactivity disorder)   . Anxiety   . Compulsive behavior disorder (HCC)   . Depression   . Headache     Past Surgical History:  Past Surgical History:  Procedure Laterality Date  . NO PAST SURGERIES      Gynecologic History:  Patient's last menstrual period was 05/18/2019. Contraception: Ortho-Evra patches weekly Last Pap: Results were: N/A under 21  Obstetric History: G1P0010  Family History:  Family History  Problem Relation Age of Onset  . Diabetes Mother   . Hypertension Mother   . Bipolar disorder Mother   . Deep vein thrombosis Mother   . Migraines Mother   . Seizures Mother   .  Colon cancer Father   . Schizophrenia Paternal Aunt   . Clotting disorder Paternal Aunt        blood clots in legs  . Heart disease Maternal Grandmother   . Hepatitis Maternal Grandmother   . Clotting disorder Maternal Grandmother        blood clots in legs  . Depression Half-Brother   . Depression Half-Sister   . Kidney disease Maternal Aunt   . Kidney disease Paternal Uncle   . Hepatitis Paternal Grandmother   . Heart disease Paternal Grandmother     Social History:  Social History   Socioeconomic History  . Marital status: Single    Spouse name: Not on file  . Number of children: Not on file  . Years of education: Not on file  . Highest education level: Not on file  Occupational History  . Not on file  Social Needs  . Financial resource strain: Not on file  . Food insecurity    Worry: Not on file    Inability: Not on file  . Transportation needs    Medical: Not on file    Non-medical: Not on file  Tobacco Use  . Smoking status: Former Games developer  . Smokeless tobacco: Never Used  Substance and Sexual Activity  . Alcohol use: Yes  . Drug use: Yes    Types: Marijuana  . Sexual activity: Yes    Birth control/protection: Injection  Lifestyle  . Physical activity  Days per week: Not on file    Minutes per session: Not on file  . Stress: Not on file  Relationships  . Social Musicianconnections    Talks on phone: Not on file    Gets together: Not on file    Attends religious service: Not on file    Active member of club or organization: Not on file    Attends meetings of clubs or organizations: Not on file    Relationship status: Not on file  . Intimate partner violence    Fear of current or ex partner: Not on file    Emotionally abused: Not on file    Physically abused: Not on file    Forced sexual activity: Not on file  Other Topics Concern  . Not on file  Social History Narrative  . Not on file    Allergies:  No Known Allergies  Medications: Prior to  Admission medications   Medication Sig Start Date End Date Taking? Authorizing Provider  cephALEXin (KEFLEX) 500 MG capsule Take 1 capsule (500 mg total) by mouth 2 (two) times daily. 06/04/19   Vena AustriaStaebler, Matilyn Fehrman, MD  naproxen (NAPROSYN) 500 MG tablet Take 1 tablet (500 mg total) by mouth 2 (two) times daily with a meal. 06/10/19   Jene EveryKinner, Robert, MD  norelgestromin-ethinyl estradiol Burr Medico(XULANE) 150-35 MCG/24HR transdermal patch Place 1 patch onto the skin once a week. Leave patch off on week 4 for 1 week 06/03/19   Vena AustriaStaebler, Shevawn Langenberg, MD    Physical Exam Vitals: Blood pressure 116/69, pulse 84, weight 126 lb (57.2 kg), last menstrual period 05/18/2019.   General: NAD HEENT: normocephalic, anicteric Thyroid: no enlargement, no palpable nodules Pulmonary: No increased work of breathing, CTAB Cardiovascular: RRR, distal pulses 2+ Breast: Breast symmetrical, no tenderness, no palpable nodules or masses, no skin or nipple retraction present, no nipple discharge.  No axillary or supraclavicular lymphadenopathy. Abdomen: NABS, soft, non-tender, non-distended.  Umbilicus without lesions.  No hepatomegaly, splenomegaly or masses palpable. No evidence of hernia  Genitourinary:  External: Normal external female genitalia.  Normal urethral meatus, normal Bartholin's and Skene's glands.    Vagina: Normal vaginal mucosa, no evidence of prolapse.    Cervix: Grossly normal in appearance, no bleeding  Uterus: Non-enlarged, mobile, normal contour.  No CMT  Adnexa: ovaries non-enlarged, no adnexal masses  Rectal: deferred  Lymphatic: no evidence of inguinal lymphadenopathy Extremities: no edema, erythema, or tenderness Neurologic: Grossly intact Psychiatric: mood appropriate, affect full  Female chaperone present for pelvic and breast  portions of the physical exam    Assessment: 18 y.o. G1P0010 routine annual exam  Plan: Problem List Items Addressed This Visit    None    Visit Diagnoses    Encounter  for surveillance of transdermal contraceptive    -  Primary   Encounter for gynecological examination without abnormal finding       Routine screening for STI (sexually transmitted infection)       Relevant Orders   HEP, RPR, HIV Panel   Cervicovaginal ancillary only   Family history of colon cancer in father       Relevant Orders   Ambulatory referral to Gastroenterology      1) 4) Gardasil Series discussed and if applicable offered to patient - Patient has previously completed 3 shot series   2) STI screening  wasoffered and accepted  - GC/CT cultures negative 05/20/2019 - No history of IV drug use HepC not obtained  3)  ASCCP guidelines and rational discussed.  Patient opts for 3 year interval starting at age 58  4) Contraception - the patient is currently using  Ortho-Evra patches weekly.  She is happy with her current form of contraception and plans to continue We discussed safe sex practices to reduce her furture risk of STI's.    Dad deceased in early 12's colon cancer? Was in late 20's at time of diagnosis.  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 - GI referral unclear what her GI history is and she is unable to relay what exactly her father passes away from.  It would be helpful if she could get additional information from her mother or obtain his medical records.  If indeed her father had colon cancer would consider Lynch Syndrome testing.   5) Return in about 1 year (around 06/16/2020) for annual.   Malachy Mood, MD, [place] 06/17/2019, 1:01 PM

## 2019-06-19 LAB — CERVICOVAGINAL ANCILLARY ONLY
Chlamydia: NEGATIVE
Neisseria Gonorrhea: NEGATIVE
Trichomonas: NEGATIVE

## 2019-06-23 ENCOUNTER — Telehealth: Payer: Self-pay

## 2019-06-23 ENCOUNTER — Other Ambulatory Visit: Payer: Self-pay

## 2019-06-23 DIAGNOSIS — Z800 Family history of malignant neoplasm of digestive organs: Secondary | ICD-10-CM

## 2019-06-23 DIAGNOSIS — Z1211 Encounter for screening for malignant neoplasm of colon: Secondary | ICD-10-CM

## 2019-06-23 MED ORDER — NA SULFATE-K SULFATE-MG SULF 17.5-3.13-1.6 GM/177ML PO SOLN: 1.0000 | Freq: Once | ORAL | 0 refills | Status: AC

## 2019-06-23 NOTE — Telephone Encounter (Signed)
Gastroenterology Pre-Procedure Review  Request Date: 07/09/19 Requesting Physician: Dr. Allen Norris  PATIENT REVIEW QUESTIONS: The patient responded to the following health history questions as indicated:    1. Are you having any GI issues?No 2. Do you have a personal history of Polyps? No 3. Do you have a family history of Colon Cancer or Polyps?Yes Father Colon Cancer 4. Diabetes Mellitus? No 5. Joint replacements in the past 12 months?No 6. Major health problems in the past 3 months?YES ER Visit 06/10/19 Chest Wall Pain 7. Any artificial heart valves, MVP, or defibrillator? No    MEDICATIONS & ALLERGIES:    Patient reports the following regarding taking any anticoagulation/antiplatelet therapy:   Plavix, Coumadin, Eliquis, Xarelto, Lovenox, Pradaxa, Brilinta, or Effient? No Aspirin? No  Patient confirms/reports the following medications:  Current Outpatient Medications  Medication Sig Dispense Refill  . cephALEXin (KEFLEX) 500 MG capsule Take 1 capsule (500 mg total) by mouth 2 (two) times daily. 14 capsule 0  . Na Sulfate-K Sulfate-Mg Sulf 17.5-3.13-1.6 GM/177ML SOLN Take 1 kit by mouth once for 1 dose. 354 mL 0  . naproxen (NAPROSYN) 500 MG tablet Take 1 tablet (500 mg total) by mouth 2 (two) times daily with a meal. 20 tablet 2  . norelgestromin-ethinyl estradiol Marilu Favre) 150-35 MCG/24HR transdermal patch Place 1 patch onto the skin once a week. Leave patch off on week 4 for 1 week 3 patch 12   No current facility-administered medications for this visit.     Patient confirms/reports the following allergies:  No Known Allergies  No orders of the defined types were placed in this encounter.   AUTHORIZATION INFORMATION Primary Insurance: 1D#: Group #:  Secondary Insurance: 1D#: Group #:  SCHEDULE INFORMATION: Date: 07/09/19 Time: Location:[facility]

## 2019-06-24 ENCOUNTER — Other Ambulatory Visit: Payer: Self-pay

## 2019-06-24 MED ORDER — NA SULFATE-K SULFATE-MG SULF 17.5-3.13-1.6 GM/177ML PO SOLN: 1.0000 | Freq: Once | ORAL | 0 refills | Status: AC

## 2019-07-06 ENCOUNTER — Other Ambulatory Visit: Payer: Self-pay | Admitting: Obstetrics and Gynecology

## 2019-07-06 ENCOUNTER — Other Ambulatory Visit: Admission: RE | Admit: 2019-07-06 | Payer: Medicaid Other | Source: Ambulatory Visit

## 2019-07-09 ENCOUNTER — Encounter: Admission: RE | Payer: Self-pay | Source: Home / Self Care

## 2019-07-09 ENCOUNTER — Ambulatory Visit: Admission: RE | Admit: 2019-07-09 | Payer: Medicaid Other | Source: Home / Self Care | Admitting: Gastroenterology

## 2019-07-09 SURGERY — COLONOSCOPY WITH PROPOFOL
Anesthesia: Choice

## 2019-07-15 ENCOUNTER — Other Ambulatory Visit: Payer: Self-pay | Admitting: Obstetrics and Gynecology

## 2019-07-15 ENCOUNTER — Ambulatory Visit: Payer: Medicaid Other | Admitting: Obstetrics and Gynecology

## 2019-07-15 MED ORDER — TRETINOIN 0.025 % EX GEL: Freq: Every day | CUTANEOUS | 0 refills | Status: DC

## 2019-07-20 ENCOUNTER — Other Ambulatory Visit: Payer: Self-pay | Admitting: Obstetrics and Gynecology

## 2019-07-20 MED ORDER — LEVONORGEST-ETH ESTRAD 91-DAY 0.15-0.03 &0.01 MG PO TABS: 1.0000 | ORAL_TABLET | Freq: Every day | ORAL | 3 refills | Status: DC

## 2019-08-16 NOTE — Progress Notes (Deleted)
Carmen Austria, MD   No chief complaint on file.   HPI:      [name] is a 18 y.o. G1P0010 who LMP was No LMP recorded. Patient has had an injection., presents today for ***  Neg STD testing 9/20  Patient Active Problem List   Diagnosis Date Noted  . Substance induced mood disorder (HCC) 04/03/2019  . 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 PTSD (post-traumatic stress disorder) 04/02/2019  . Amenorrhea 11/18/2018  . ODD (oppositional defiant disorder) 09/05/2015  . Major depressive disorder, recurrent episode, moderate (HCC) 09/04/2015  . Acne vulgaris 08/15/2015  . ADHD (attention deficit hyperactivity disorder) 04/20/2014    Past Surgical History:  Procedure Laterality Date  . NO PAST SURGERIES      Family History  Problem Relation Age of Onset  . Diabetes Mother   . Hypertension Mother   . Bipolar disorder Mother   . Deep vein thrombosis Mother   . Migraines Mother   . Seizures Mother   . Colon cancer Father   . Schizophrenia Paternal Aunt   . Clotting disorder Paternal Aunt        blood clots in legs  . Heart disease Maternal Grandmother   . Hepatitis Maternal Grandmother   . Clotting disorder Maternal Grandmother        blood clots in legs  . Depression Half-Brother   . Depression Half-Sister   . Kidney disease Maternal Aunt   . Kidney disease Paternal Uncle   . Hepatitis Paternal Grandmother   . Heart disease Paternal Grandmother     Social History   Socioeconomic History  . Marital status: Single    Spouse name: Not on file  . Number of children: Not on file  . Years of education: Not on file  . Highest education level: Not on file  Occupational History  . Not on file  Social Needs  . Financial resource strain: Not on file  . Food insecurity    Worry: Not on file    Inability: Not on file  . Transportation needs    Medical: Not on file    Non-medical: Not on file  Tobacco Use  . Smoking status: Former Games developer  . Smokeless  tobacco: Never Used  Substance and Sexual Activity  . Alcohol use: Yes  . Drug use: Yes    Types: Marijuana  . Sexual activity: Yes    Birth control/protection: Injection  Lifestyle  . Physical activity    Days per week: Not on file    Minutes per session: Not on file  . Stress: Not on file  Relationships  . Social Musician on phone: Not on file    Gets together: Not on file    Attends religious service: Not on file    Active member of club or organization: Not on file    Attends meetings of clubs or organizations: Not on file    Relationship status: Not on file  . Intimate partner violence    Fear of current or ex partner: Not on file    Emotionally abused: Not on file    Physically abused: Not on file    Forced sexual activity: Not on file  Other Topics Concern  . Not on file  Social History Narrative  . Not on file    Outpatient Medications Prior to Visit  Medication Sig Dispense Refill  . cephALEXin (KEFLEX) 500 MG capsule Take 1 capsule (500 mg total) by  mouth 2 (two) times daily. 14 capsule 0  . ESTARYLLA 0.25-35 MG-MCG tablet TAKE 1 TABLET BY MOUTH EVERY DAY AT THE SAME TIME EACH DAY 84 tablet 3  . Levonorgestrel-Ethinyl Estradiol (SEASONIQUE) 0.15-0.03 &0.01 MG tablet Take 1 tablet by mouth daily. 1 Package 3  . naproxen (NAPROSYN) 500 MG tablet Take 1 tablet (500 mg total) by mouth 2 (two) times daily with a meal. 20 tablet 2  . norelgestromin-ethinyl estradiol Marilu Favre) 150-35 MCG/24HR transdermal patch Place 1 patch onto the skin once a week. Leave patch off on week 4 for 1 week 3 patch 12  . tretinoin (RETIN-A) 0.025 % gel Apply topically at bedtime. 45 g 0   No facility-administered medications prior to visit.       ROS:  Review of Systems BREAST: No symptoms   OBJECTIVE:   Vitals:  There were no vitals taken for this visit.  Physical Exam  Results: No results found for this or any previous visit (from the past 24 hour(s)).    Assessment/Plan: No diagnosis found.    No orders of the defined types were placed in this encounter.     No follow-ups on file.  [place] Greb B. Analea Muller, PA-C 08/16/2019 6:41 PM

## 2019-08-17 ENCOUNTER — Ambulatory Visit: Payer: Medicaid Other | Admitting: Obstetrics and Gynecology

## 2019-09-10 ENCOUNTER — Ambulatory Visit: Payer: Medicaid Other

## 2019-09-23 ENCOUNTER — Ambulatory Visit (INDEPENDENT_AMBULATORY_CARE_PROVIDER_SITE_OTHER): Payer: Medicaid Other | Admitting: Obstetrics and Gynecology

## 2019-09-23 ENCOUNTER — Encounter: Payer: Self-pay | Admitting: Obstetrics and Gynecology

## 2019-09-23 ENCOUNTER — Other Ambulatory Visit: Payer: Self-pay

## 2019-09-23 ENCOUNTER — Other Ambulatory Visit (HOSPITAL_COMMUNITY)
Admission: RE | Admit: 2019-09-23 | Discharge: 2019-09-23 | Disposition: A | Discharge status: Home / Self Care | Payer: Medicaid Other | Source: Ambulatory Visit | Attending: Obstetrics and Gynecology | Admitting: Obstetrics and Gynecology

## 2019-09-23 VITALS — BP 106/64 | Wt 132.0 lb

## 2019-09-23 DIAGNOSIS — Z113 Encounter for screening for infections with a predominantly sexual mode of transmission: Secondary | ICD-10-CM | POA: Diagnosis not present

## 2019-09-23 DIAGNOSIS — Z3202 Encounter for pregnancy test, result negative: Secondary | ICD-10-CM | POA: Diagnosis not present

## 2019-09-23 DIAGNOSIS — Z3042 Encounter for surveillance of injectable contraceptive: Secondary | ICD-10-CM | POA: Diagnosis not present

## 2019-09-23 DIAGNOSIS — Z30013 Encounter for initial prescription of injectable contraceptive: Secondary | ICD-10-CM

## 2019-09-23 DIAGNOSIS — N912 Amenorrhea, unspecified: Secondary | ICD-10-CM

## 2019-09-23 LAB — POCT URINE PREGNANCY: Preg Test, Ur: NEGATIVE

## 2019-09-23 MED ORDER — MEDROXYPROGESTERONE ACETATE 150 MG/ML IM SUSP: 150.0000 mg | INTRAMUSCULAR | 4 refills | Status: DC

## 2019-09-23 MED ORDER — MEDROXYPROGESTERONE ACETATE 150 MG/ML IM SUSP
150.0000 mg | Freq: Once | INTRAMUSCULAR | Status: AC
  Administered 2019-09-23: 10:00:00 150 mg via INTRAMUSCULAR

## 2019-09-23 NOTE — Patient Instructions (Signed)
Pt was given Depo Provera today. Given in RUOQ tolerated well

## 2019-09-23 NOTE — Progress Notes (Signed)
Obstetrics & Gynecology Office Visit   Chief Complaint  Patient presents with  . STD check    Anxiety  . Amenorrhea    LMP 10/17, spotted x2 days this month   History of Present Illness: Sexually Transmitted Disease Check: Patient presents for sexually transmitted disease check. Sexual history reviewed with the patient. STD exposure: she has a new partner and she is unsure of his risk.  Previous history of STD:  chlamydia. Current symptoms include painful intercourse. She otherwise denies vaginal irritative symptoms like itching, burning, irritation.  Contraception: none currently. She also would like the blood test panel.   She also wants to restart Depo-Provera. She has irregular periods and does not remember to take the pill regularly. She has been on Depo Provera in the past and did well. She does not want anything inside her body. She has previously been counseled on other forms of contraception.  She reports other symptoms in her review of symptoms sheet. However, she would like to focus on the above issues today.    Past Medical History:  Diagnosis Date  . ADHD (attention deficit hyperactivity disorder)   . Anxiety   . 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 Depression   . Headache     Past Surgical History:  Procedure Laterality Date  . NO PAST SURGERIES      Gynecologic History: Patient's last menstrual period was 07/18/2019.  Obstetric History: G1P0010  Family History  Problem Relation Age of Onset  . Diabetes Mother   . Hypertension Mother   . Bipolar disorder Mother   . Deep vein thrombosis Mother   . Migraines Mother   . Seizures Mother   . Colon cancer Father   . Schizophrenia Paternal Aunt   . Clotting disorder Paternal Aunt        blood clots in legs  . Heart disease Maternal Grandmother   . Hepatitis Maternal Grandmother   . Clotting disorder Maternal Grandmother        blood clots in legs  . Depression Half-Brother   . Depression Half-Sister   .  Kidney disease Maternal Aunt   . Kidney disease Paternal Uncle   . Hepatitis Paternal Grandmother   . Heart disease Paternal Grandmother     Social History   Socioeconomic History  . Marital status: Single    Spouse name: Not on file  . Number of children: Not on file  . Years of education: Not on file  . Highest education level: Not on file  Occupational History  . Not on file  Tobacco Use  . Smoking status: Former Research scientist (life sciences)  . Smokeless tobacco: Never Used  Substance and Sexual Activity  . Alcohol use: Yes  . Drug use: Yes    Types: Marijuana  . Sexual activity: Yes    Birth control/protection: Injection  Other Topics Concern  . Not on file  Social History Narrative  . Not on file   Social Determinants of Health   Financial Resource Strain:   . Difficulty of Paying Living Expenses: Not on file  Food Insecurity:   . Worried About Charity fundraiser in the Last Year: Not on file  . Ran Out of Food in the Last Year: Not on file  Transportation Needs:   . Lack of Transportation (Medical): Not on file  . Lack of Transportation (Non-Medical): Not on file  Physical Activity:   . Days of Exercise per Week: Not on file  . Minutes of Exercise per Session:  Not on file  Stress:   . Feeling of Stress : Not on file  Social Connections:   . Frequency of Communication with Friends and Family: Not on file  . Frequency of Social Gatherings with Friends and Family: Not on file  . Attends Religious Services: Not on file  . Active Member of Clubs or Organizations: Not on file  . Attends Banker Meetings: Not on file  . Marital Status: Not on file  Intimate Partner Violence:   . Fear of Current or Ex-Partner: Not on file  . Emotionally Abused: Not on file  . Physically Abused: Not on file  . Sexually Abused: Not on file    No Known Allergies  Prior to Admission medications   Medication Sig Start Date End Date Taking? Authorizing Provider  cephALEXin (KEFLEX) 500  MG capsule Take 1 capsule (500 mg total) by mouth 2 (two) times daily. 06/04/19   Vena Austria, MD  naproxen (NAPROSYN) 500 MG tablet Take 1 tablet (500 mg total) by mouth 2 (two) times daily with a meal. 06/10/19   Jene Every, MD  tretinoin (RETIN-A) 0.025 % gel Apply topically at bedtime. 07/15/19   Vena Austria, MD    Review of Systems  Constitutional: Negative.        Hot flashes  HENT: Negative.   Eyes: Negative.   Respiratory: Negative.   Cardiovascular: Negative.   Gastrointestinal: Positive for nausea (thinks it's related to coffee) and vomiting (believes it is related to coffee). Negative for abdominal pain, blood in stool, constipation, diarrhea, heartburn and melena.  Genitourinary: Negative.        Vaginal bleeding, discharge, pain with sex  Musculoskeletal: Negative.   Skin: Negative.        Breast tenderness  Neurological: Positive for weakness. Negative for dizziness, tingling, tremors, sensory change, speech change, focal weakness, seizures, loss of consciousness and headaches.  Psychiatric/Behavioral: Negative for depression, hallucinations, memory loss, substance abuse and suicidal ideas. The patient is nervous/anxious. The patient does not have insomnia.      Physical Exam BP 106/64   Wt 132 lb (59.9 kg)   LMP 07/18/2019   BMI 24.14 kg/m  Patient's last menstrual period was 07/18/2019. Physical Exam Constitutional:      General: She is not in acute distress.    Appearance: Normal appearance.  Genitourinary:     Pelvic exam was performed with patient in the lithotomy position.     Vulva, inguinal canal, urethra, bladder, vagina, cervix, uterus, right adnexa and left adnexa normal.     No posterior fourchette tenderness, injury, rash or lesion present.  HENT:     Head: Normocephalic and atraumatic.  Eyes:     General: No scleral icterus.    Conjunctiva/sclera: Conjunctivae normal.  Neurological:     General: No focal deficit present.     Mental  Status: She is alert and oriented to person, place, and time.     Cranial Nerves: No cranial nerve deficit.  Psychiatric:        Mood and Affect: Mood normal.        Behavior: Behavior normal.        Judgment: Judgment normal.    Female chaperone present for pelvic and breast  portions of the physical exam  Urine Pregnancy Test: negative  Assessment: 18 y.o. G55P0010 female here for  1. Screen for STD (sexually transmitted disease)   2. Amenorrhea   3. Encounter for initial prescription of injectable contraceptive  Plan: Problem List Items Addressed This Visit      Other   Amenorrhea   Relevant Orders   POCT urine pregnancy (Completed)    Other Visit Diagnoses    Screen for STD (sexually transmitted disease)    -  Primary   Relevant Orders   Cervicovaginal ancillary only   HIV antibody (LC)   Hepatitis C antibody (LC)   HSV 1 AND 2 IGM ABS(LC)   HSV 2 AB, IgG (LC)   RPR Qual (LC)   Hepatitis B Surface AntiGEN   HSV 1 antibody, IgG   Encounter for initial prescription of injectable contraceptive       Relevant Medications   medroxyPROGESTERone (DEPO-PROVERA) 150 MG/ML injection     STD screening collected by swab and labs ordered.  Discussed safe sex practices. She is using a condom with her current partner, but wants to be sure of his status.  Reviewed several different kinds of contraception. She has previously considered and tried others, but would like to re-start Depo Provera.   20 minutes spent in face to face discussion with > 50% spent in counseling,management, and coordination of care of her screen for STDs and contraceptive management (injectable).    Thomasene MohairStephen Navpreet Szczygiel, MD 09/23/2019 9:00 AM

## 2019-09-23 NOTE — Addendum Note (Signed)
Addended by: [place], Makaria Poarch B on: 09/23/2019 09:57 AM   Modules accepted: Orders

## 2019-09-24 ENCOUNTER — Other Ambulatory Visit: Payer: Self-pay | Admitting: Obstetrics and Gynecology

## 2019-09-24 DIAGNOSIS — A5609 Other chlamydial infection of lower genitourinary tract: Secondary | ICD-10-CM

## 2019-09-24 LAB — CERVICOVAGINAL ANCILLARY ONLY
Chlamydia: POSITIVE — AB
Comment: NEGATIVE
Comment: NEGATIVE
Comment: NORMAL
Neisseria Gonorrhea: NEGATIVE
Trichomonas: NEGATIVE

## 2019-09-24 MED ORDER — AZITHROMYCIN 500 MG PO TABS: 1000.0000 mg | ORAL_TABLET | Freq: Once | ORAL | 0 refills | Status: AC

## 2019-09-28 LAB — RPR QUALITATIVE: RPR Ser Ql: NONREACTIVE

## 2019-09-28 LAB — HSV 1 AND 2 IGM ABS, INDIRECT
HSV 1 IgM: <1:10 {titer}
HSV 2 IgM: <1:10 {titer}

## 2019-09-28 LAB — HSV 2 ANTIBODY, IGG: HSV 2 IgG, Type Spec: <0.91 index (ref 0.00–0.90)

## 2019-09-28 LAB — HIV ANTIBODY (ROUTINE TESTING W REFLEX): HIV Screen 4th Generation wRfx: NONREACTIVE

## 2019-09-28 LAB — HSV 1 ANTIBODY, IGG: HSV 1 Glycoprotein G Ab, IgG: <0.91 index (ref 0.00–0.90)

## 2019-09-28 LAB — HEPATITIS B SURFACE ANTIGEN: Hepatitis B Surface Ag: NEGATIVE

## 2019-09-28 LAB — HEPATITIS C ANTIBODY: Hep C Virus Ab: <0.1 s/co ratio (ref 0.0–0.9)

## 2019-11-12 ENCOUNTER — Ambulatory Visit: Payer: Medicaid Other | Admitting: Physician Assistant

## 2019-11-12 ENCOUNTER — Encounter: Payer: Self-pay | Admitting: Physician Assistant

## 2019-11-12 ENCOUNTER — Other Ambulatory Visit: Payer: Self-pay

## 2019-11-12 DIAGNOSIS — B3731 Acute candidiasis of vulva and vagina: Secondary | ICD-10-CM

## 2019-11-12 DIAGNOSIS — B373 Candidiasis of vulva and vagina: Secondary | ICD-10-CM

## 2019-11-12 DIAGNOSIS — Z113 Encounter for screening for infections with a predominantly sexual mode of transmission: Secondary | ICD-10-CM | POA: Diagnosis not present

## 2019-11-12 LAB — WET PREP FOR TRICH, YEAST, CLUE
Trichomonas Exam: NEGATIVE
Yeast Exam: NEGATIVE

## 2019-11-12 MED ORDER — CLOTRIMAZOLE 1 % VA CREA: 1.0000 | TOPICAL_CREAM | Freq: Every day | VAGINAL | 0 refills | Status: AC

## 2019-11-12 NOTE — Progress Notes (Signed)
[place] results reviewed. Patient treated per provider orders. Cannon Arreola, RN  

## 2019-11-12 NOTE — Progress Notes (Signed)
[facility] Department STI clinic/screening visit  Subjective:  Carmen Black is a 19 y.o. female being seen today for an STI screening visit. The patient reports they do have symptoms.  Patient reports that they do not desire a pregnancy in the next year.   They reported they are not interested in discussing contraception today.  No LMP recorded. Patient has had an injection.   Patient has the following medical conditions:   Patient Active Problem List   Diagnosis Date Noted  . Substance induced mood disorder (HCC) 04/03/2019  . 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 PTSD (post-traumatic stress disorder) 04/02/2019  . Amenorrhea 11/18/2018  . ODD (oppositional defiant disorder) 09/05/2015  . Major depressive disorder, recurrent episode, moderate (HCC) 09/04/2015  . Acne vulgaris 08/15/2015  . ADHD (attention deficit hyperactivity disorder) 04/20/2014    Chief Complaint  Patient presents with  . SEXUALLY TRANSMITTED DISEASE    HPI  Patient reports that she has had an increased amount of white vaginal discharge and a "tightening" sensation in the vaginal area for the past 2 weeks or so.  Denies other symptoms. Using Depo and not having periods with this method.  See flowsheet for further details and programmatic requirements.    The following portions of the patient's history were reviewed and updated as appropriate: allergies, current medications, past medical history, past social history, past surgical history and problem list.  Objective:  There were no vitals filed for this visit.  Physical Exam Constitutional:      General: She is not in acute distress.    Appearance: Normal appearance. She is normal weight.  HENT:     Head: Normocephalic and atraumatic.     Comments: No nits, lice, or hair loss. No cervical, supraclavicular or axillary adenopathy.     Mouth/Throat:     Mouth: Mucous membranes are moist.     Pharynx: Oropharynx is clear. No  oropharyngeal exudate or posterior oropharyngeal erythema.  Eyes:     Conjunctiva/sclera: Conjunctivae normal.  Pulmonary:     Effort: Pulmonary effort is normal.  Abdominal:     Palpations: Abdomen is soft. There is no mass.     Tenderness: There is no abdominal tenderness. There is no guarding or rebound.  Genitourinary:    General: Normal vulva.     Rectum: Normal.     Comments: External genitalia/pubic area without nits, lice, edema, erythema, lesions and inguinal adenopathy. Vagina with normal mucosa, moderate amount of white, clumping discharge, pH=4.5. Cervix without visible lesions. Uterus firm, mobile, nt, no masses, no CMT, no adnexal tenderness or fullness. Musculoskeletal:     Cervical back: Neck supple. No tenderness.  Skin:    General: Skin is warm and dry.     Findings: No bruising, erythema, lesion or rash.  Neurological:     Mental Status: She is alert and oriented to person, place, and time.  Psychiatric:        Mood and Affect: Mood normal.        Behavior: Behavior normal.        Thought Content: Thought content normal.        Judgment: Judgment normal.      Assessment and Plan:  Carmen Black is a 19 y.o. female presenting to the [facility] Department for STI screening  1. Screening for STD (sexually transmitted disease) Patient into clinic with symptoms. Rec condoms with all sex. Await test results.  Counseled that RN will call if needs to RTC  for further treatment once results are back.  - WET PREP FOR [place], YEAST, CLUE - Chlamydia/Gonorrhea [place] [facility] - HIV [place] [facility] - Syphilis Serology, [place] [facility]  2. Candidal vulvovaginitis Treat with Clotrimazole 1% vaginal cream 1 app qhs for 7 days. No sex for 7 days. - clotrimazole (CLOTRIMAZOLE-7) 1 % vaginal cream; Place 1 Applicatorful vaginally at bedtime for 7 days.  Dispense: 45 g; Refill: 0     No follow-ups on file.  Future Appointments  Date Time Provider [place]  12/16/2019 10:00 AM [facility] None    Jerene Dilling, [place]

## 2019-11-12 NOTE — Progress Notes (Signed)
Here today for STD screening. Accepts bloodwork. Natoshia Souter, RN ° °

## 2019-11-16 ENCOUNTER — Ambulatory Visit: Payer: Medicaid Other | Admitting: Obstetrics and Gynecology

## 2019-11-18 ENCOUNTER — Telehealth: Payer: Self-pay

## 2019-11-29 ENCOUNTER — Ambulatory Visit
Admission: EM | Admit: 2019-11-29 | Discharge: 2019-11-29 | Disposition: A | Discharge status: Home / Self Care | Payer: Medicaid Other | Attending: Urgent Care | Admitting: Urgent Care

## 2019-11-29 ENCOUNTER — Encounter: Payer: Self-pay | Admitting: Emergency Medicine

## 2019-11-29 ENCOUNTER — Other Ambulatory Visit: Payer: Self-pay

## 2019-11-29 DIAGNOSIS — Z20822 Contact with and (suspected) exposure to covid-19: Secondary | ICD-10-CM | POA: Diagnosis not present

## 2019-11-29 DIAGNOSIS — M542 Cervicalgia: Secondary | ICD-10-CM | POA: Diagnosis not present

## 2019-11-29 DIAGNOSIS — Z793 Long term (current) use of hormonal contraceptives: Secondary | ICD-10-CM | POA: Diagnosis not present

## 2019-11-29 DIAGNOSIS — H9202 Otalgia, left ear: Secondary | ICD-10-CM | POA: Insufficient documentation

## 2019-11-29 LAB — GROUP A STREP BY PCR: Group A Strep by PCR: NOT DETECTED

## 2019-11-29 MED ORDER — MELOXICAM 15 MG PO TABS: 15.0000 mg | ORAL_TABLET | Freq: Every day | ORAL | 0 refills | Status: DC

## 2019-11-29 MED ORDER — TIZANIDINE HCL 4 MG PO CAPS: 4.0000 mg | ORAL_CAPSULE | Freq: Three times a day (TID) | ORAL | 0 refills | Status: DC

## 2019-11-29 NOTE — Discharge Instructions (Addendum)
Can apply ice to your neck for comfort 20 minutes every 2 hours 4-5 times daily.  Remain in quarantine until the results of your Covid testing has been returned.  This usually takes 18 to 24 hours.  If the test is negative you may return to work.  If the test is positive please contact your primary care physician for further instructions.

## 2019-11-29 NOTE — ED Triage Notes (Signed)
Patient c/o left ear pain that started 4 days ago.  Patient states that after the first day she started having pain in her neck and jaw.  Patient denies fevers.  Patient wants to be tested for COVID.

## 2019-11-29 NOTE — ED Provider Notes (Signed)
[facility]    CSN: [id number] Arrival date & time: 11/29/19  1255      History   Chief Complaint Chief Complaint  Patient presents with  . Sore Throat  . Otalgia    left    HPI Carmen Black is a 19 y.o. female.   HPI  19 year old female presents complaining of left ear pain that started 4 days ago.  She states that after the first day she noticed having pain in her neck and below her jaw.  She has had no fever or chills.  Her neck is not stiff to movement although it is slightly limited.   She has no history of injury.  Ear pain was located in the muscles inferior to the ear.  Had no difficulty with swallowing or eating.  She denied any tooth aches.  She did have a small ulcer on her cheek and was concerned that this could be herpes.  Today she is afebrile.  Pulse rate of 98 blood pressure 110/65 O2 sats 100% on room air with respirations of 14  She denies being around anybody sick with similar symptoms.  Denies any coughing or shortness of breath.      Past Medical History:  Diagnosis Date  . ADHD (attention deficit hyperactivity disorder)   . Anxiety   . 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 Depression   . Headache     Patient Active Problem List   Diagnosis Date Noted  . Substance induced mood disorder ([place]) 04/03/2019  . Ecstasy poisoning ([place]) 04/03/2019  . PTSD (post-traumatic stress disorder) 04/02/2019  . Amenorrhea 11/18/2018  . ODD (oppositional defiant disorder) 09/05/2015  . Major depressive disorder, recurrent episode, moderate ([place]) 09/04/2015  . Acne vulgaris 08/15/2015  . ADHD (attention deficit hyperactivity disorder) 04/20/2014    Past Surgical History:  Procedure Laterality Date  . NO PAST SURGERIES      OB History    Gravida  1   Para      Term      Preterm      AB  1   Living        SAB  1   TAB      Ectopic      Multiple      Live Births               Home Medications    Prior to Admission  medications   Medication Sig Start Date End Date Taking? Authorizing Provider  medroxyPROGESTERone (DEPO-PROVERA) 150 MG/ML injection Inject 1 mL (150 mg total) into the muscle every 3 (three) months. 09/23/19  Yes Will Bonnet, MD  meloxicam (MOBIC) 15 MG tablet Take 1 tablet (15 mg total) by mouth daily. Take with food. 11/29/19   Lorin Picket, PA-C  tiZANidine (ZANAFLEX) 4 MG capsule Take 1 capsule (4 mg total) by mouth 3 (three) times daily. 11/29/19   Lorin Picket, PA-C  ESTARYLLA 0.25-35 MG-MCG tablet TAKE 1 TABLET BY MOUTH EVERY DAY AT THE SAME TIME [facility] DAY Patient not taking: Reported on 11/12/2019 07/07/19 11/29/19  Malachy Mood, MD  Levonorgestrel-Ethinyl Estradiol (SEASONIQUE) 0.15-0.03 &0.01 MG tablet Take 1 tablet by mouth daily. Patient not taking: Reported on 11/12/2019 07/20/19 11/29/19  Malachy Mood, MD  norelgestromin-ethinyl estradiol Marilu Favre) 150-35 MCG/24HR transdermal patch Place 1 patch onto the skin once a week. Leave patch off on week 4 for 1 week Patient not taking: Reported on 11/12/2019 06/03/19 11/29/19  Malachy Mood, MD  Family History Family History  Problem Relation Age of Onset  . Diabetes Mother   . Hypertension Mother   . Bipolar disorder Mother   . Deep vein thrombosis Mother   . Migraines Mother   . Seizures Mother   . Colon cancer Father   . Schizophrenia Paternal Aunt   . Clotting disorder Paternal Aunt        blood clots in legs  . Heart disease Maternal Grandmother   . Hepatitis Maternal Grandmother   . Clotting disorder Maternal Grandmother        blood clots in legs  . Depression Half-Brother   . Depression Half-Sister   . Kidney disease Maternal Aunt   . Kidney disease Paternal Uncle   . Hepatitis Paternal Grandmother   . Heart disease Paternal Grandmother     Social History Social History   Tobacco Use  . Smoking status: Never Smoker  . Smokeless tobacco: Current User  . Tobacco comment: Vapes  Substance  Use Topics  . Alcohol use: Yes  . Drug use: Yes    Types: Marijuana     Allergies   Patient has no known allergies.   Review of Systems Review of Systems  Constitutional: Positive for activity change. Negative for appetite change, chills, diaphoresis, fatigue and fever.  HENT: Positive for ear pain and mouth sores.   Respiratory: Negative for cough and shortness of breath.   All other systems reviewed and are negative.    Physical Exam Triage Vital Signs ED Triage Vitals  Enc Vitals Group     BP 11/29/19 1316 110/65     Pulse Rate 11/29/19 1316 68     Resp 11/29/19 1316 14     Temp 11/29/19 1316 98.3 F (36.8 C)     Temp Source 11/29/19 1316 Oral     SpO2 11/29/19 1316 100 %     Weight 11/29/19 1311 132 lb (59.9 kg)     Height 11/29/19 1311 5\' 2"  (1.575 m)     Head Circumference --      Peak Flow --      Pain Score 11/29/19 1311 3     Pain Loc --      Pain Edu? --      Excl. in GC? --    No data found.  Updated Vital Signs BP 110/65 (BP Location: Right Arm)   Pulse 68   Temp 98.3 F (36.8 C) (Oral)   Resp 14   Ht 5\' 2"  (1.575 m)   Wt 132 lb (59.9 kg)   SpO2 100%   BMI 24.14 kg/m   Visual Acuity Right Eye Distance:   Left Eye Distance:   Bilateral Distance:    Right Eye Near:   Left Eye Near:    Bilateral Near:     Physical Exam Vitals and nursing note reviewed.  Constitutional:      General: She is not in acute distress.    Appearance: She is well-developed. She is not ill-appearing or toxic-appearing.  HENT:     Head: Normocephalic and atraumatic.     Right Ear: Tympanic membrane and ear canal normal.     Left Ear: Tympanic membrane and ear canal normal.     Nose: No congestion or rhinorrhea.     Mouth/Throat:     Mouth: Mucous membranes are moist. No oral lesions.     Pharynx: Oropharynx is clear. Uvula midline. No pharyngeal swelling, oropharyngeal exudate, posterior oropharyngeal erythema or uvula swelling.  Tonsils: No tonsillar  exudate or tonsillar abscesses. 0 on the right. 0 on the left.  Eyes:     Conjunctiva/sclera: Conjunctivae normal.  Neck:     Comments: Examination of the cervical spine shows limitation of motion with discomfort at the extremes of extension leftward rotation and worse with lateral flexion and extension on the left.  He has mild tenderness of the paraspinous muscles and most of her pain appears to be submandibular but there is no adenopathy present.  There is no lesions present.  Examination of her mouth did not show any significant lesions.  Her dentition is in very good repair. Lymphadenopathy:     Cervical: No cervical adenopathy.  Skin:    General: Skin is warm and dry.  Neurological:     General: No focal deficit present.     Mental Status: She is alert and oriented to person, place, and time.  Psychiatric:        Mood and Affect: Mood normal.        Behavior: Behavior normal.      UC Treatments / Results  Labs (all labs ordered are listed, but only abnormal results are displayed) Labs Reviewed  GROUP A STREP BY PCR  NOVEL CORONAVIRUS, NAA (HOSP ORDER, SEND-OUT TO REF LAB; TAT 18-24 HRS)    EKG   Radiology No results found.  Procedures Procedures (including critical care time)  Medications Ordered in UC Medications - No data to display  Initial Impression / Assessment and Plan / UC Course  I have reviewed the triage vital signs and the nursing notes.  Pertinent labs & imaging results that were available during my care of the patient were reviewed by me and considered in my medical decision making (see chart for details).    19 year old female complains of left ear pain that started 4 days ago and has gradually gone to her left ear and neck.  She states that most of the pain appears to be under her jaw but physical exam did not reveal any abnormalities other than discomfort to palpation.  The jaw itself is not tender and there is no palpable lesions or crepitus  present.  TMs are normal bilaterally.  TMJ is show only mild tenderness over the right.  Her most reproducible and noticeable physical exam finding is that of neck pain with motion particularly left forward lateral flexion and extension.  Extension of the neck and rotation particular to the left also reproduces these symptoms of discomfort.  Upper extremity strength is intact.  Her strep test today was negative.  Covid test was obtained and is pending at this time.   I do not have significant findings for her submandibular pain that she has bilaterally.  She does have pain with neck motion and this could be what she is explaining.  I will treat this symptomatically with Mobic and Zanaflex.  Given appropriate precautions.  Remain out of work and quarantined until the results of the Covid test are reported.  If negative, she may return to work if positive she will need to contact her primary care physician for further instructions.  He was given a note for work.  Final Clinical Impressions(s) / UC Diagnoses   Final diagnoses:  Neck pain without injury     Discharge Instructions     Can apply ice to your neck for comfort 20 minutes every 2 hours 4-5 times daily.  Remain in quarantine until the results of your Covid testing has been returned.  This usually takes 18 to 24 hours.  If the test is negative you may return to work.  If the test is positive please contact your primary care physician for further instructions.   ED Prescriptions    Medication Sig Dispense Auth. Provider   meloxicam (MOBIC) 15 MG tablet Take 1 tablet (15 mg total) by mouth daily. Take with food. 30 tablet Ovid Curd P, PA-C   tiZANidine (ZANAFLEX) 4 MG capsule Take 1 capsule (4 mg total) by mouth 3 (three) times daily. 21 capsule Lutricia Feil, PA-C     PDMP not reviewed this encounter.   Lutricia Feil, PA-C 11/29/19 1456

## 2019-11-30 LAB — NOVEL CORONAVIRUS, NAA (HOSP ORDER, SEND-OUT TO REF LAB; TAT 18-24 HRS): SARS-CoV-2, NAA: NOT DETECTED

## 2019-12-16 ENCOUNTER — Ambulatory Visit: Payer: Medicaid Other

## 2020-01-06 ENCOUNTER — Other Ambulatory Visit: Payer: Self-pay

## 2020-01-06 ENCOUNTER — Ambulatory Visit: Payer: Medicaid Other | Admitting: Physician Assistant

## 2020-01-06 DIAGNOSIS — Z113 Encounter for screening for infections with a predominantly sexual mode of transmission: Secondary | ICD-10-CM | POA: Diagnosis not present

## 2020-01-06 DIAGNOSIS — B3731 Acute candidiasis of vulva and vagina: Secondary | ICD-10-CM

## 2020-01-06 DIAGNOSIS — B373 Candidiasis of vulva and vagina: Secondary | ICD-10-CM | POA: Diagnosis not present

## 2020-01-06 LAB — WET PREP FOR TRICH, YEAST, CLUE: Trichomonas Exam: NEGATIVE

## 2020-01-08 ENCOUNTER — Encounter: Payer: Self-pay | Admitting: Physician Assistant

## 2020-01-08 NOTE — Progress Notes (Signed)
[facility] Department STI clinic/screening visit  Subjective:  Carmen Black is a 19 y.o. female being seen today for an STI screening visit. The patient reports they do have symptoms.  Patient reports that they do not desire a pregnancy in the next year.   They reported they are not interested in discussing contraception today.  No LMP recorded. Patient has had an injection.   Patient has the following medical conditions:   Patient Active Problem List   Diagnosis Date Noted  . Substance induced mood disorder (HCC) 04/03/2019  . 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 PTSD (post-traumatic stress disorder) 04/02/2019  . Amenorrhea 11/18/2018  . ODD (oppositional defiant disorder) 09/05/2015  . Major depressive disorder, recurrent episode, moderate (HCC) 09/04/2015  . Acne vulgaris 08/15/2015  . ADHD (attention deficit hyperactivity disorder) 04/20/2014    Chief Complaint  Patient presents with  . SEXUALLY TRANSMITTED DISEASE    HPI  Patient reports that she has been having vaginal irritation for about 1 week.  States that she was due to get her Depo a couple of weeks ago and did not get it to have a period.  LMP 12/25/2019.    See flowsheet for further details and programmatic requirements.    The following portions of the patient's history were reviewed and updated as appropriate: allergies, current medications, past medical history, past social history, past surgical history and problem list.  Objective:  There were no vitals filed for this visit.  Physical Exam Constitutional:      General: She is not in acute distress.    Appearance: Normal appearance. She is normal weight.  HENT:     Head: Normocephalic and atraumatic.     Comments: No nits, lice or hair loss. No cervical, supraclavicular or axillary adenopathy.    Mouth/Throat:     Mouth: Mucous membranes are moist.     Pharynx: Oropharynx is clear. No oropharyngeal exudate or posterior oropharyngeal  erythema.  Eyes:     Conjunctiva/sclera: Conjunctivae normal.  Pulmonary:     Effort: Pulmonary effort is normal.  Abdominal:     Palpations: Abdomen is soft. There is no mass.     Tenderness: There is no abdominal tenderness. There is no guarding or rebound.  Genitourinary:    General: Normal vulva.     Rectum: Normal.     Comments: External genitalia/pubic area without nits, lice, edema, erythema, lesions and inguinal adenopathy. Vagina with normal mucosa, small amount of dark menstrual blood present. Cervix without visible lesions. Uterus firm, mobile, nt, no masses, no CMT, no adnexal tenderness or fullness. Musculoskeletal:     Cervical back: Neck supple. No tenderness.  Skin:    General: Skin is warm and dry.     Findings: No bruising, erythema, lesion or rash.  Neurological:     Mental Status: She is alert and oriented to person, place, and time.  Psychiatric:        Mood and Affect: Mood normal.        Behavior: Behavior normal.        Thought Content: Thought content normal.        Judgment: Judgment normal.      Assessment and Plan:  Carmen Black is a 19 y.o. female presenting to the [facility] Department for STI screening  1. Screening for STD (sexually transmitted disease) Patient into clinic with symptoms.  Declines blood work today. Rec condoms with all sex. Await test results.  Counseled that RN will  call if needs to RTC for further treatment once results are back. Counseled patient that she may have irregular bleeding for up to 18 months after Depo wears off if she decides to not get her Depo. - WET PREP FOR [place], YEAST, CLUE - Gonococcus culture - [place] [facility]  2. Candida vaginitis Counseled that wet mount shows yeast infection. Patient states that she has medicine at home to use and declines offered cream from [facility].     No follow-ups on file.  No future appointments.  Jerene Dilling, PA

## 2020-01-11 LAB — GONOCOCCUS CULTURE

## 2020-01-28 NOTE — Telephone Encounter (Signed)
complete

## 2020-02-19 ENCOUNTER — Ambulatory Visit: Payer: Medicaid Other | Admitting: Advanced Practice Midwife

## 2020-02-19 ENCOUNTER — Ambulatory Visit: Payer: Medicaid Other

## 2020-02-19 ENCOUNTER — Other Ambulatory Visit: Payer: Self-pay

## 2020-02-19 DIAGNOSIS — T7412XA Child physical abuse, confirmed, initial encounter: Secondary | ICD-10-CM | POA: Insufficient documentation

## 2020-02-19 DIAGNOSIS — Z113 Encounter for screening for infections with a predominantly sexual mode of transmission: Secondary | ICD-10-CM

## 2020-02-19 DIAGNOSIS — T7412XS Child physical abuse, confirmed, sequela: Secondary | ICD-10-CM

## 2020-02-19 DIAGNOSIS — Z62810 Personal history of physical and sexual abuse in childhood: Secondary | ICD-10-CM

## 2020-02-19 DIAGNOSIS — F319 Bipolar disorder, unspecified: Secondary | ICD-10-CM | POA: Insufficient documentation

## 2020-02-19 HISTORY — DX: Personal history of physical and sexual abuse in childhood: Z62.810

## 2020-02-19 LAB — PREGNANCY, URINE: Preg Test, Ur: NEGATIVE

## 2020-02-19 LAB — WET PREP FOR TRICH, YEAST, CLUE
Trichomonas Exam: NEGATIVE
Yeast Exam: NEGATIVE

## 2020-02-19 NOTE — Progress Notes (Signed)
Wet mount and UPT reviewed, no tx per standing order. Pt accepted LCSW business card. Provider orders completed.
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
## 2020-02-23 LAB — GONOCOCCUS CULTURE

## 2020-03-29 ENCOUNTER — Other Ambulatory Visit: Payer: Self-pay

## 2020-03-29 ENCOUNTER — Ambulatory Visit: Payer: Medicaid Other | Admitting: Physician Assistant

## 2020-03-29 DIAGNOSIS — Z113 Encounter for screening for infections with a predominantly sexual mode of transmission: Secondary | ICD-10-CM

## 2020-03-29 LAB — WET PREP FOR TRICH, YEAST, CLUE
Trichomonas Exam: NEGATIVE
Yeast Exam: NEGATIVE

## 2020-03-29 NOTE — Progress Notes (Signed)
Wet mount reviewed and is negative today, so no treatment needed for wet mount per standing order. Awaiting TR's. Provider orders completed.

## 2020-03-30 ENCOUNTER — Encounter: Payer: Self-pay | Admitting: Physician Assistant
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
## 2020-04-08 ENCOUNTER — Encounter: Payer: Self-pay | Admitting: Emergency Medicine

## 2020-04-08 ENCOUNTER — Ambulatory Visit
Admission: EM | Admit: 2020-04-08 | Discharge: 2020-04-08 | Disposition: A | Discharge status: Home / Self Care | Payer: Medicaid Other | Attending: Family Medicine | Admitting: Family Medicine

## 2020-04-08 ENCOUNTER — Other Ambulatory Visit: Payer: Self-pay

## 2020-04-08 DIAGNOSIS — R05 Cough: Secondary | ICD-10-CM | POA: Diagnosis not present

## 2020-04-08 DIAGNOSIS — H6501 Acute serous otitis media, right ear: Secondary | ICD-10-CM | POA: Insufficient documentation

## 2020-04-08 DIAGNOSIS — J069 Acute upper respiratory infection, unspecified: Secondary | ICD-10-CM

## 2020-04-08 DIAGNOSIS — J020 Streptococcal pharyngitis: Secondary | ICD-10-CM | POA: Diagnosis not present

## 2020-04-08 DIAGNOSIS — Z20822 Contact with and (suspected) exposure to covid-19: Secondary | ICD-10-CM | POA: Insufficient documentation

## 2020-04-08 DIAGNOSIS — Z791 Long term (current) use of non-steroidal anti-inflammatories (NSAID): Secondary | ICD-10-CM | POA: Insufficient documentation

## 2020-04-08 DIAGNOSIS — Z793 Long term (current) use of hormonal contraceptives: Secondary | ICD-10-CM | POA: Diagnosis not present

## 2020-04-08 LAB — GROUP A STREP BY PCR: Group A Strep by PCR: DETECTED — AB

## 2020-04-08 MED ORDER — AMOXICILLIN 875 MG PO TABS
875.0000 mg | ORAL_TABLET | Freq: Two times a day (BID) | ORAL | 0 refills | Status: DC
Start: 2020-04-08 — End: 2020-07-04

## 2020-04-08 NOTE — Discharge Instructions (Signed)
Tylenol/advil as needed Increase water intake
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
## 2020-04-08 NOTE — ED Triage Notes (Signed)
Patient c/o sore throat, headache, and fever that started yesterday.

## 2020-04-09 LAB — SARS CORONAVIRUS 2 (TAT 6-24 HRS): SARS Coronavirus 2: NEGATIVE

## 2020-04-25 ENCOUNTER — Ambulatory Visit: Payer: Medicaid Other | Admitting: Advanced Practice Midwife

## 2020-04-25 ENCOUNTER — Other Ambulatory Visit: Payer: Self-pay

## 2020-04-25 ENCOUNTER — Encounter: Payer: Self-pay | Admitting: Advanced Practice Midwife

## 2020-04-25 DIAGNOSIS — F1290 Cannabis use, unspecified, uncomplicated: Secondary | ICD-10-CM

## 2020-04-25 DIAGNOSIS — Z113 Encounter for screening for infections with a predominantly sexual mode of transmission: Secondary | ICD-10-CM

## 2020-04-25 LAB — WET PREP FOR TRICH, YEAST, CLUE
Trichomonas Exam: NEGATIVE
Yeast Exam: NEGATIVE

## 2020-04-25 NOTE — Progress Notes (Signed)
Wet mount reviewed, no tx per standing orders. Provider orders completed. 
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
## 2020-04-29 ENCOUNTER — Encounter: Payer: Self-pay | Admitting: Family Medicine

## 2020-04-29 LAB — HEPATITIS B SURFACE ANTIGEN

## 2020-04-29 LAB — HM HIV SCREENING LAB: HM HIV Screening: NEGATIVE

## 2020-04-29 LAB — HM HEPATITIS C SCREENING LAB: HM Hepatitis Screen: NEGATIVE

## 2020-04-30 LAB — GONOCOCCUS CULTURE

## 2020-05-02 ENCOUNTER — Telehealth: Payer: Self-pay | Admitting: Family Medicine

## 2020-05-02 NOTE — Telephone Encounter (Signed)
PATIENT STATES SHE SEEN ON MY CHART THAT HER HEP B TEST SAID REACTIVE AND IS CONCERNED WOULD LIKE TO SPEAK TO NURSE ABOUT THAT.

## 2020-05-02 NOTE — Telephone Encounter (Signed)
Phone call to pt. Pt knew password from visit. Discussed Hep B results, questions answered.

## 2020-05-16 ENCOUNTER — Encounter: Payer: Self-pay | Admitting: Obstetrics and Gynecology

## 2020-05-16 ENCOUNTER — Ambulatory Visit (INDEPENDENT_AMBULATORY_CARE_PROVIDER_SITE_OTHER): Payer: Medicaid Other | Admitting: Obstetrics and Gynecology

## 2020-05-16 ENCOUNTER — Other Ambulatory Visit: Payer: Self-pay

## 2020-05-16 VITALS — BP 100/70 | Ht 62.0 in | Wt 122.6 lb

## 2020-05-16 DIAGNOSIS — Z131 Encounter for screening for diabetes mellitus: Secondary | ICD-10-CM

## 2020-05-16 DIAGNOSIS — Z1329 Encounter for screening for other suspected endocrine disorder: Secondary | ICD-10-CM

## 2020-05-16 DIAGNOSIS — N912 Amenorrhea, unspecified: Secondary | ICD-10-CM

## 2020-05-16 DIAGNOSIS — Z130 Encounter for screening for diseases of the blood and blood-forming organs and certain disorders involving the immune mechanism: Secondary | ICD-10-CM | POA: Diagnosis not present

## 2020-05-16 DIAGNOSIS — Z0000 Encounter for general adult medical examination without abnormal findings: Secondary | ICD-10-CM

## 2020-05-16 NOTE — Patient Instructions (Signed)
Secondary Amenorrhea  Secondary amenorrhea occurs when a female who was previously having menstrual periods has not had them for 3-6 months. A menstrual period is the monthly shedding of the lining of the uterus. Menstruation involves the passing of blood, tissue, fluid, and mucus through the vagina. The flow of blood usually occurs during 3-7 consecutive days each month. This condition has many causes. In many cases, treating the underlying cause will return menstrual periods back to a normal cycle. What are the causes? The most common cause of this condition is pregnancy. Other causes include:  Malnutrition.  Cirrhosis of the liver.  Conditions of the blood.  Diabetes.  Epilepsy.  Chronic kidney disease.  Polycystic ovary disease.  Stress or anxiety.  A hormonal imbalance.  Ovarian failure.  Medicines.  Extreme obesity.  Cystic fibrosis.  Low body weight or drastic weight loss.  Early menopause.  Removal of the ovaries or uterus.  Contraceptive pills, patches, or vaginal rings.  Cushing syndrome.  Thyroid problems. What increases the risk? You are more likely to develop this condition if:  You have a family history of this condition.  You have an eating disorder.  You do extreme athletic training.  You have a chronic disease.  You abuse substances such as alcohol or cigarettes. What are the signs or symptoms? The main symptom of this condition is a lack of menstrual periods for 3-6 months. How is this diagnosed? This condition may be diagnosed based on:  Your medical history.  A physical exam.  A pelvic exam to check for problems with your reproductive organs.  A procedure to examine the uterus.  A measurement of your body mass index (BMI).  Tests, such as: ? Blood tests that measure certain hormones in your body and rule out pregnancy. ? Urine tests. ? Imaging tests, such as an ultrasound, CT scan, or MRI. How is this treated? Treatment  for this condition depends on the cause of the amenorrhea. It may involve:  Correcting dietary problems.  Treating underlying conditions.  Medicines.  Lifestyle changes.  Surgery. If the condition cannot be corrected, it is sometimes possible to trigger menstrual periods with medicines. Follow these instructions at home: Lifestyle  Maintain a healthy diet. Ask to meet with a registered dietitian for nutrition counseling and meal planning.  Maintain a healthy weight. Talk to your health care provider before trying any new diet or exercise plan.  Exercise at least 30 minutes 5 or more days each week. Exercising includes brisk walking, yard work, biking, running, swimming, and team sports like basketball and soccer. Ask your health care provider which exercises are safe for you.  Get enough sleep. Plan your sleep time to allow for 7-9 hours of sleep each night.  Learn to manage stress. Explore relaxation techniques such as meditation, journaling, yoga, or tai chi. General instructions  Be aware of changes in your menstrual cycle. Keep a record of when you have your menstrual period. Note the date your period starts, how long it lasts, and any problems you experience.  Take over-the-counter and prescription medicines only as told by your health care provider.  Keep all follow-up visits as told by your health care provider. This is important. Contact a health care provider if:  Your periods do not return to normal after treatment. Summary  Secondary amenorrhea is when a female who was previously having menstrual periods has not gotten her period for 3-6 months.  This condition has many causes. In many cases, treating the underlying   cause will return menstrual periods back to a normal cycle.  Talk to your health care provider if your periods do not return to normal after treatment. This information is not intended to replace advice given to you by your health care provider. Make  sure you discuss any questions you have with your health care provider. Document Revised: 03/03/2019 Document Reviewed: 12/06/2016 Elsevier Patient Education  [place]. Primary Amenorrhea  Primary amenorrhea is when a female has not started having periods by the age of 61 years. What are the causes? This condition may be caused by:  An abnormal chromosome that causes the ovaries not to work properly (common).  Malnutrition.  Low blood sugar (hypoglycemia).  Polycystic ovary syndrome.  Being born without a vagina, uterus, or ovaries.  Extreme obesity.  Cystic fibrosis.  Drastic weight loss.  Over-exercising that leads to a loss of body fat.  Pituitary gland tumor in the brain.  Long-term illnesses.  Cushing disease.  A thyroid disease, such as hypothyroidism or hyperthyroidism.  A part of the brain called the hypothalamus not working normally.  Premature ovarian failure. What are the signs or symptoms? The main symptom of this condition is not having had a period by age 15 years. Other symptoms include:  Discharge from the breasts.  Hot flashes.  Adult acne.  Facial or chest hair.  Headaches.  Impaired vision.  Recent excessive stress.  Changes in weight, diet, or exercise patterns. How is this diagnosed? This condition may be diagnosed based on:  Your medical history.  A physical exam.  Tests, such as: ? Blood tests. ? Urine tests. How is this treated? Treatment for this condition depends on the cause. For example, an absence of sex organs will require surgery to correct the problem. Others causes may respond when treated with medicine. Follow these instructions at home:  Maintain a healthy diet.  Maintain a healthy weight.  Exercise regularly but not excessively.  Take over-the-counter and prescription medicines only as told by your health care provider.  Keep all follow-up visits as told by your health care provider. This is  important. Contact a health care provider if:  Your body is not developing at the level of your peers.  You have pelvic pain.  You gain an unusual amount of weight.  You have an unusual amount of hair growth. Summary  Primary amenorrhea is when a female has not started having periods by the age of 44 years.  This condition has many causes, including abnormal ovaries, obesity, extreme weight loss.  Contact a health care provider if your body is not developing at the level of your peers. This information is not intended to replace advice given to you by your health care provider. Make sure you discuss any questions you have with your health care provider. Document Revised: 03/03/2019 Document Reviewed: 12/04/2016 Elsevier Patient Education  [place].
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
## 2020-05-26 LAB — CBC WITH DIFFERENTIAL
Basophils Absolute: 0.1 10*3/uL (ref 0.0–0.2)
Basos: 1 %
EOS (ABSOLUTE): 0.1 10*3/uL (ref 0.0–0.4)
Eos: 2 %
Hematocrit: 41.1 % (ref 34.0–46.6)
Hemoglobin: 13.9 g/dL (ref 11.1–15.9)
Immature Grans (Abs): 0 10*3/uL (ref 0.0–0.1)
Immature Granulocytes: 0 %
Lymphocytes Absolute: 2 10*3/uL (ref 0.7–3.1)
Lymphs: 35 %
MCH: 31.2 pg (ref 26.6–33.0)
MCHC: 33.8 g/dL (ref 31.5–35.7)
MCV: 92 fL (ref 79–97)
Monocytes Absolute: 0.7 10*3/uL (ref 0.1–0.9)
Monocytes: 12 %
Neutrophils Absolute: 2.8 10*3/uL (ref 1.4–7.0)
Neutrophils: 50 %
RBC: 4.45 x10E6/uL (ref 3.77–5.28)
RDW: 12.2 % (ref 11.7–15.4)
WBC: 5.7 10*3/uL (ref 3.4–10.8)

## 2020-05-26 LAB — COMPREHENSIVE METABOLIC PANEL
ALT: 11 IU/L (ref 0–32)
AST: 16 IU/L (ref 0–40)
Albumin/Globulin Ratio: 2.1 (ref 1.2–2.2)
Albumin: 4.7 g/dL (ref 3.9–5.0)
Alkaline Phosphatase: 56 IU/L (ref 45–106)
BUN/Creatinine Ratio: 18 (ref 9–23)
BUN: 13 mg/dL (ref 6–20)
Bilirubin Total: <0.2 mg/dL (ref 0.0–1.2)
CO2: 24 mmol/L (ref 20–29)
Calcium: 9.6 mg/dL (ref 8.7–10.2)
Chloride: 102 mmol/L (ref 96–106)
Creatinine, Ser: 0.74 mg/dL (ref 0.57–1.00)
GFR calc Af Amer: 135 mL/min/{1.73_m2} (ref >59)
GFR calc non Af Amer: 117 mL/min/{1.73_m2} (ref >59)
Globulin, Total: 2.2 g/dL (ref 1.5–4.5)
Glucose: 80 mg/dL (ref 65–99)
Potassium: 4.6 mmol/L (ref 3.5–5.2)
Sodium: 139 mmol/L (ref 134–144)
Total Protein: 6.9 g/dL (ref 6.0–8.5)

## 2020-05-26 LAB — T3, FREE: T3, Free: 3.1 pg/mL (ref 2.0–4.4)

## 2020-05-26 LAB — BETA HCG QUANT (REF LAB): hCG Quant: <1 m[IU]/mL

## 2020-05-26 LAB — TESTOSTERONE, FREE, TOTAL, SHBG
Sex Hormone Binding: 65.6 nmol/L (ref 24.6–122.0)
Testosterone, Free: 1.7 pg/mL (ref 0.0–4.2)
Testosterone: 28 ng/dL (ref 13–71)

## 2020-05-26 LAB — DHEA-SULFATE: DHEA-SO4: 390 ug/dL (ref 110.0–431.7)

## 2020-05-26 LAB — T4, FREE: Free T4: 1.07 ng/dL (ref 0.82–1.77)

## 2020-05-26 LAB — FOLLICLE STIMULATING HORMONE: FSH: 9.5 m[IU]/mL

## 2020-05-26 LAB — 17-HYDROXYPROGESTERONE: 17-Hydroxyprogesterone: 24 ng/dL

## 2020-05-26 LAB — HEMOGLOBIN A1C
Est. average glucose Bld gHb Est-mCnc: 103 mg/dL
Hgb A1c MFr Bld: 5.2 % (ref 4.8–5.6)

## 2020-05-26 LAB — ESTRADIOL: Estradiol: 84.8 pg/mL

## 2020-05-26 LAB — ANTI MULLERIAN HORMONE: ANTI-MULLERIAN HORMONE (AMH): 2.02 ng/mL

## 2020-05-26 LAB — TSH: TSH: 1.2 u[IU]/mL (ref 0.450–4.500)

## 2020-05-26 LAB — ANDROSTENEDIONE: Androstenedione: 67 ng/dL (ref 41–262)

## 2020-05-26 LAB — PROLACTIN: Prolactin: 11.4 ng/mL (ref 4.8–23.3)

## 2020-06-03 ENCOUNTER — Ambulatory Visit (INDEPENDENT_AMBULATORY_CARE_PROVIDER_SITE_OTHER): Payer: Medicaid Other | Admitting: Obstetrics and Gynecology

## 2020-06-03 ENCOUNTER — Encounter: Payer: Self-pay | Admitting: Obstetrics and Gynecology

## 2020-06-03 ENCOUNTER — Other Ambulatory Visit: Payer: Self-pay

## 2020-06-03 VITALS — Ht 62.0 in | Wt 133.0 lb

## 2020-06-03 DIAGNOSIS — N912 Amenorrhea, unspecified: Secondary | ICD-10-CM

## 2020-06-03 MED ORDER — MEDROXYPROGESTERONE ACETATE 10 MG PO TABS: 10.0000 mg | ORAL_TABLET | Freq: Every day | ORAL | 0 refills | Status: DC

## 2020-06-03 NOTE — Progress Notes (Signed)
Virtual Visit via Telephone Note  I connected with Carmen Black on 06/03/20 at 11:10 AM EDT by telephone and verified that I am speaking with the correct person using two identifiers.   I discussed the limitations, risks, security and privacy concerns of performing an evaluation and management service by telephone and the availability of in person appointments. I also discussed with the patient that there may be a patient responsible charge related to this service. The patient expressed understanding and agreed to proceed.  The patient was at home I spoke with the patient from my  office The names of people involved in this encounter were: Dr. Jerene Pitch and Carmen Black  History of Present Illness:  She is following up to discuss her lab results.  She is supposed to have a visit today in the office with ultrasound but she is not feeling well.  She has a history of amenorrhea.  She reports that she has never had a. Despite taking birth control pills in the past.   Her lab results from her prior visit were normal and we reviewed and discussed these today over the phone.   Observations/Objective:   Physical Exam could not be performed. Because of the COVID-19 outbreak this visit was performed over the phone and not in person.   Assessment and Plan: 19 yo with amenorrhea  Follow Up Instructions: Will follow up for pelvic US.  After [place] will start 10 days of progesterone to see if she has a withdrawal bleed Reviewed normal lab results.  Discussed benefits of hormonal birth control in setting of PCOS for regulation of endometrium and reduction in and future cancer risks.    I discussed the assessment and treatment plan with the patient. The patient was provided an opportunity to ask questions and all were answered. The patient agreed with the plan and demonstrated an understanding of the instructions.   The patient was advised to call back or seek an in-person evaluation if the symptoms  worsen or if the condition fails to improve as anticipated.  I provided 6 minutes of non-face-to-face time during this encounter.  Adelene Idler MD [facility], [facility] 06/03/2020 3:05 PM  Adelene Idler MD, [facility], [facility] 06/03/2020 11:52 AM

## 2020-07-04 ENCOUNTER — Ambulatory Visit (INDEPENDENT_AMBULATORY_CARE_PROVIDER_SITE_OTHER): Payer: Medicaid Other | Admitting: Obstetrics and Gynecology

## 2020-07-04 ENCOUNTER — Other Ambulatory Visit (INDEPENDENT_AMBULATORY_CARE_PROVIDER_SITE_OTHER): Payer: Medicaid Other

## 2020-07-04 ENCOUNTER — Other Ambulatory Visit: Payer: Self-pay

## 2020-07-04 ENCOUNTER — Encounter: Payer: Self-pay | Admitting: Obstetrics and Gynecology

## 2020-07-04 ENCOUNTER — Other Ambulatory Visit: Payer: Self-pay | Admitting: Obstetrics and Gynecology

## 2020-07-04 VITALS — BP 100/70 | Ht 62.0 in | Wt 123.4 lb

## 2020-07-04 DIAGNOSIS — N912 Amenorrhea, unspecified: Secondary | ICD-10-CM

## 2020-07-04 DIAGNOSIS — N910 Primary amenorrhea: Secondary | ICD-10-CM
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
## 2020-07-09 ENCOUNTER — Ambulatory Visit
Admission: EM | Admit: 2020-07-09 | Discharge: 2020-07-09 | Disposition: A | Discharge status: Home / Self Care | Payer: Medicaid Other | Attending: Family Medicine | Admitting: Family Medicine

## 2020-07-09 ENCOUNTER — Other Ambulatory Visit: Payer: Self-pay

## 2020-07-09 ENCOUNTER — Encounter: Payer: Self-pay | Admitting: Emergency Medicine

## 2020-07-09 DIAGNOSIS — Z20822 Contact with and (suspected) exposure to covid-19: Secondary | ICD-10-CM | POA: Insufficient documentation

## 2020-07-09 DIAGNOSIS — F909 Attention-deficit hyperactivity disorder, unspecified type: Secondary | ICD-10-CM | POA: Diagnosis not present

## 2020-07-09 DIAGNOSIS — J0110 Acute frontal sinusitis, unspecified: Secondary | ICD-10-CM

## 2020-07-09 DIAGNOSIS — Z793 Long term (current) use of hormonal contraceptives: Secondary | ICD-10-CM | POA: Insufficient documentation

## 2020-07-09 DIAGNOSIS — Z87891 Personal history of nicotine dependence: Secondary | ICD-10-CM | POA: Diagnosis not present

## 2020-07-09 DIAGNOSIS — R519 Headache, unspecified: Secondary | ICD-10-CM | POA: Diagnosis present

## 2020-07-09 MED ORDER — AMOXICILLIN-POT CLAVULANATE 875-125 MG PO TABS: 1.0000 | ORAL_TABLET | Freq: Two times a day (BID) | ORAL | 0 refills | Status: DC

## 2020-07-09 NOTE — Discharge Instructions (Signed)
Medication as prescribed.  Take care  Dr. Keilee Denman  

## 2020-07-09 NOTE — ED Triage Notes (Signed)
Patient c/o headache, sinus pressure, right ear pain and hoarse cough that started on Monday.  Patient denies fevers.
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
## 2020-07-10 LAB — SARS CORONAVIRUS 2 (TAT 6-24 HRS): SARS Coronavirus 2: NEGATIVE

## 2020-07-14 ENCOUNTER — Ambulatory Visit: Payer: Medicaid Other | Admitting: Physician Assistant

## 2020-07-14 ENCOUNTER — Other Ambulatory Visit: Payer: Self-pay

## 2020-07-14 DIAGNOSIS — B3731 Acute candidiasis of vulva and vagina: Secondary | ICD-10-CM

## 2020-07-14 DIAGNOSIS — Z113 Encounter for screening for infections with a predominantly sexual mode of transmission: Secondary | ICD-10-CM

## 2020-07-14 DIAGNOSIS — B373 Candidiasis of vulva and vagina: Secondary | ICD-10-CM

## 2020-07-14 LAB — WET PREP FOR TRICH, YEAST, CLUE: Trichomonas Exam: NEGATIVE

## 2020-07-14 MED ORDER — CLOTRIMAZOLE 1 % VA CREA: 1.0000 | TOPICAL_CREAM | Freq: Every day | VAGINAL | 0 refills | Status: AC

## 2020-07-14 NOTE — Progress Notes (Signed)
Wet mount reviewed, pt treated for yeast per Dr. Lyndel Safe standing orders.

## 2020-07-15 ENCOUNTER — Encounter: Payer: Self-pay | Admitting: Physician Assistant

## 2020-07-15 ENCOUNTER — Other Ambulatory Visit: Payer: Self-pay

## 2020-07-15 ENCOUNTER — Emergency Department
Admission: EM | Admit: 2020-07-15 | Discharge: 2020-07-15 | Disposition: A | Discharge status: Home / Self Care | Payer: Medicaid Other | Attending: Emergency Medicine | Admitting: Emergency Medicine

## 2020-07-15 ENCOUNTER — Encounter: Payer: Self-pay | Admitting: Emergency Medicine

## 2020-07-15 DIAGNOSIS — N61 Mastitis without abscess: Secondary | ICD-10-CM

## 2020-07-15 DIAGNOSIS — N611 Abscess of the breast and nipple: Secondary | ICD-10-CM | POA: Diagnosis not present

## 2020-07-15 DIAGNOSIS — N644 Mastodynia: Secondary | ICD-10-CM | POA: Diagnosis present

## 2020-07-15 DIAGNOSIS — F17290 Nicotine dependence, other tobacco product, uncomplicated: Secondary | ICD-10-CM | POA: Diagnosis not present

## 2020-07-15 MED ORDER — SULFAMETHOXAZOLE-TRIMETHOPRIM 800-160 MG PO TABS
1.0000 | ORAL_TABLET | Freq: Two times a day (BID) | ORAL | 0 refills | Status: DC
Start: 2020-07-15 — End: 2021-02-07

## 2020-07-15 MED ORDER — MUPIROCIN 2 % EX OINT
TOPICAL_OINTMENT | CUTANEOUS | 0 refills | Status: DC
Start: 2020-07-15 — End: 2021-02-07

## 2020-07-15 MED ORDER — SULFAMETHOXAZOLE-TRIMETHOPRIM 800-160 MG PO TABS
1.0000 | ORAL_TABLET | Freq: Once | ORAL | Status: AC
  Administered 2020-07-15: 1 via ORAL
  Filled 2020-07-15: qty 1
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
## 2020-07-15 NOTE — Discharge Instructions (Signed)
You are being treated for a cellulitis of the nipple.  Take antibiotic as directed.  Keep the level clean, dry, and covered with a thin veil of antibiotic ointment.  Apply warm compresses to promote healing.  Follow-up with your primary provider return to the ED if needed.

## 2020-07-15 NOTE — ED Triage Notes (Signed)
Pt to ER with c/o nipple pain and swelling from a nipple ring.  PT states she went to the tattoo shop and had the rings removed today PTA.  Pt reports continued pain to left nipple.  Pt reports drainage from left piercing, no drainage noted in triage.  Minor redness noted to area.

## 2020-07-15 NOTE — ED Notes (Signed)
See triage note. Pt had nipples re-pierced in March, has had piercing's since she was 22. Pt c/o bilateral nipple swelling and breast pain x3 days. Pt recently started taking medroxyprogesterone and amox/clav (for ear infection).
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
## 2020-07-19 LAB — GONOCOCCUS CULTURE

## 2020-07-25 ENCOUNTER — Ambulatory Visit: Payer: Medicaid Other | Admitting: Obstetrics and Gynecology

## 2020-08-15 ENCOUNTER — Ambulatory Visit: Payer: Medicaid Other | Admitting: Obstetrics and Gynecology

## 2020-08-17 ENCOUNTER — Ambulatory Visit: Payer: Medicaid Other | Admitting: Obstetrics and Gynecology

## 2020-08-18 ENCOUNTER — Ambulatory Visit: Payer: Medicaid Other

## 2020-09-07 ENCOUNTER — Ambulatory Visit: Payer: Medicaid Other

## 2020-09-13 ENCOUNTER — Other Ambulatory Visit: Payer: Self-pay

## 2020-09-13 ENCOUNTER — Ambulatory Visit (INDEPENDENT_AMBULATORY_CARE_PROVIDER_SITE_OTHER): Payer: Medicaid Other | Admitting: Obstetrics and Gynecology

## 2020-09-13 ENCOUNTER — Encounter: Payer: Self-pay | Admitting: Obstetrics and Gynecology

## 2020-09-13 VITALS — BP 114/70 | Ht 62.0 in | Wt 122.8 lb

## 2020-09-13 DIAGNOSIS — Z3009 Encounter for other general counseling and advice on contraception: Secondary | ICD-10-CM

## 2020-09-13 DIAGNOSIS — N921 Excessive and frequent menstruation with irregular cycle: Secondary | ICD-10-CM

## 2020-09-13 DIAGNOSIS — N83209 Unspecified ovarian cyst, unspecified side: Secondary | ICD-10-CM | POA: Diagnosis not present

## 2020-09-13 DIAGNOSIS — Z113 Encounter for screening for infections with a predominantly sexual mode of transmission: Secondary | ICD-10-CM | POA: Diagnosis not present

## 2020-09-13 DIAGNOSIS — N912 Amenorrhea, unspecified: Secondary | ICD-10-CM | POA: Diagnosis not present

## 2020-09-13 MED ORDER — NORETHIN ACE-ETH ESTRAD-FE 1-20 MG-MCG PO TABS: 1.0000 | ORAL_TABLET | Freq: Every day | ORAL | 11 refills | Status: DC

## 2020-09-13 MED ORDER — MEDROXYPROGESTERONE ACETATE 10 MG PO TABS: 10.0000 mg | ORAL_TABLET | Freq: Every day | ORAL | 2 refills | Status: DC

## 2020-09-13 NOTE — Progress Notes (Signed)
Pt states she has not been having a period. Pt states the first on e was 08/27/20 light and lasted 3 days. Spotting afterwards.

## 2020-09-13 NOTE — Patient Instructions (Signed)
Hysterosalpingography  Hysterosalpingography is a procedure in which a doctor looks inside a woman's womb (uterus) and fallopian tubes. During the procedure, a dye is injected into the womb. Then X-rays are taken. The dye makes the womb show up on X-rays. What happens before the procedure?  Schedule the procedure after your period stops, but before your next ovulation. This is usually between day 5 and day 10 of your last period. Day 1 is the first day of your period.  Ask your doctor about changing or stopping your normal medicines. This is important if you take diabetes medicines or blood thinners.  Pee before the procedure starts.  Plan to have someone take you home. What happens during the procedure?  You may be given one of these: ? A medicine to help you relax (sedative). ? An over-the-counter pain medicine.  You will lie down on your back. Your feet will be placed in foot rests (stirrups).  A device (speculum) will be placed into your vagina. This lets your doctor see the lower part of your womb (cervix).  Your cervix will be washed with a soap that kills germs.  A medicine may be injected into your cervix to numb it (local anesthesia).  A tube will be passed into your womb.  Dye will be passed through the tube and into the womb. The dye may cause some cramps.  X-rays will be taken.  The tube will be taken out. The dye will flow out of your vagina on its own. The procedure may vary among doctors and hospitals. What happens after the procedure?  Most of the dye will flow out on its own. Wear a pad if needed.  You may have mild cramping and vaginal bleeding.  Do not drive for 24 hours if you were given a medicine to help you relax.  It is up to you to get the results of your test. Ask your doctor when your results will be ready. Summary  Hysterosalpingography is a procedure in which a doctor looks inside a woman's womb and fallopian tubes.  In this procedure, dye  is injected into the womb. Then, X-rays are taken. The dye makes the womb show up on the X-rays.  Plan to have this procedure after your period stops, but before your next ovulation. This is often between days 5 and 10 of your last period. Day 1 is the first day of your period.  After the procedure, you may have mild cramping and bleeding. Most of the dye will flow out on its own. Wear a pad if needed. This information is not intended to replace advice given to you by your health care provider. Make sure you discuss any questions you have with your health care provider. Document Revised: 08/30/2017 Document Reviewed: 10/10/2016 Elsevier Patient Education  2020 [employer]. Hormonal Contraception Information Hormonal contraception is a type of birth control that uses hormones to prevent pregnancy. It usually involves a combination of the hormones estrogen and progesterone or only the hormone progesterone. Hormonal contraception works in these ways:  It thickens the mucus in the cervix, making it harder for sperm to enter the uterus.  It changes the lining of the uterus, making it harder for an egg to implant.  It may stop the ovaries from releasing eggs (ovulation). Some women who take hormonal contraceptives that contain only progesterone may continue to ovulate. Hormonal contraception cannot prevent sexually transmitted infections (STIs). Pregnancy may still occur. Estrogen and progesterone contraceptives Contraceptives that use a combination  of estrogen and progesterone are available in these forms:  Pill. Pills come in different combinations of hormones. They must be taken at the same time each day. Pills can affect your period, causing you to get your period once every three months or not at all.  Patch. The patch must be worn on the lower abdomen for three weeks and then removed on the fourth.  Vaginal ring. The ring is placed in the vagina and left there for three weeks. It is then  removed for one week. Progesterone contraceptives Contraceptives that use progesterone only are available in these forms:  Pill. Pills should be taken every day of the cycle.  Intrauterine device (IUD). This device is inserted into the uterus and removed or replaced every five years or sooner.  Implant. Plastic rods are placed under the skin of the upper arm. They are removed or replaced every three years or sooner.  Injection. The injection is given once every 90 days. What are the side effects? The side effects of estrogen and progesterone contraceptives include:  Nausea.  Headaches.  Breast tenderness.  Bleeding or spotting between menstrual cycles.  High blood pressure (rare).  Strokes, heart attacks, or blood clots (rare) Side effects of progesterone-only contraceptives include:  Nausea.  Headaches.  Breast tenderness.  Unpredictable menstrual bleeding.  High blood pressure (rare). Talk to your health care provider about what side effects may affect you. Where to find more information  Ask your health care provider for more information and resources about hormonal contraception.  [place] Department of Health and Cyt[employer]ist on Women's Health: http://hoffman.com/ Questions to ask:  What type of hormonal contraception is right for me?  How long should I plan to use hormonal contraception?  What are the side effects of the hormonal contraception method I choose?  How can I prevent STIs while using hormonal contraception? Contact a health care provider if:  You start taking hormonal contraceptives and you develop persistent or severe side effects. Summary  Estrogen and progesterone are hormones used in many forms of birth control.  Talk to your health care provider about what side effects may affect you.  Hormonal contraception cannot prevent sexually transmitted infections (STIs).  Ask your health care provider for more information and resources  about hormonal contraception. This information is not intended to replace advice given to you by your health care provider. Make sure you discuss any questions you have with your health care provider. Document Revised: 01/12/2019 Document Reviewed: 08/17/2016 Elsevier Patient Education  2020 [employer].

## 2020-09-13 NOTE — Progress Notes (Signed)
Patient ID: Carmen Black, female   DOB: [birth year], 19 y.o.   MRN: [id number]  Reason for Consult: No chief complaint on file.   Referred by Natale Milch, *  Subjective:     HPI:  Carmen Black is a 19 y.o. female . She has been following up regarding menstrual cycle abnormalities and amenorrhea.    Past Medical History:  Diagnosis Date  . ADHD (attention deficit hyperactivity disorder)   . Anxiety   . Compulsive behavior disorder (HCC)   . Depression   . Headache    Family History  Problem Relation Age of Onset  . Diabetes Mother   . Hypertension Mother   . Bipolar disorder Mother   . Deep vein thrombosis Mother   . Migraines Mother   . Seizures Mother   . Colon cancer Father   . Schizophrenia Paternal Aunt   . Clotting disorder Paternal Aunt        blood clots in legs  . Heart disease Maternal Grandmother   . Hepatitis Maternal Grandmother   . Clotting disorder Maternal Grandmother        blood clots in legs  . Depression Half-Brother   . Depression Half-Sister   . Kidney disease Maternal Aunt   . Kidney disease Paternal Uncle   . Hepatitis Paternal Grandmother   . Heart disease Paternal Grandmother    Past Surgical History:  Procedure Laterality Date  . NO PAST SURGERIES      Short Social History:  Social History   Tobacco Use  . Smoking status: Former Smoker    Types: E-cigarettes  . Smokeless tobacco: Never Used  . Tobacco comment: Vapes  Substance Use Topics  . Alcohol use: Yes    No Known Allergies  Current Outpatient Medications  Medication Sig Dispense Refill  . amoxicillin-clavulanate (AUGMENTIN) 875-125 MG tablet Take 1 tablet by mouth 2 (two) times daily. 20 tablet 0  . medroxyPROGESTERone (PROVERA) 10 MG tablet Take 1 tablet (10 mg total) by mouth daily. 10 tablet 0  . mupirocin ointment (BACTROBAN) 2 % Apply to affected area 3 times daily 22 g 0  . sulfamethoxazole-trimethoprim (BACTRIM DS) 800-160 MG tablet Take 1 tablet  by mouth 2 (two) times daily. 20 tablet 0   No current facility-administered medications for this visit.    Review of Systems  Constitutional: Negative for chills, fatigue, fever and unexpected weight change.  HENT: Negative for trouble swallowing.  Eyes: Negative for loss of vision.  Respiratory: Negative for cough, shortness of breath and wheezing.  Cardiovascular: Negative for chest pain, leg swelling, palpitations and syncope.  GI: Negative for abdominal pain, blood in stool, diarrhea, nausea and vomiting.  GU: Negative for difficulty urinating, dysuria, frequency and hematuria.  Musculoskeletal: Negative for back pain, leg pain and joint pain.  Skin: Negative for rash.  Neurological: Negative for dizziness, headaches, light-headedness, numbness and seizures.  Psychiatric: Negative for behavioral problem, confusion, depressed mood and sleep disturbance.        Objective:  Objective   Vitals:   09/13/20 1500  BP: 114/70  Weight: 122 lb 12.8 oz (55.7 kg)  Height: 5\' 2"  (1.575 m)   Body mass index is 22.46 kg/m.  Physical Exam Vitals and nursing note reviewed.  Constitutional:      Appearance: She is well-developed and well-nourished.  HENT:     Head: Normocephalic and atraumatic.  Eyes:     Extraocular Movements: EOM normal.     Pupils: Pupils are equal,  round, and reactive to light.  Cardiovascular:     Rate and Rhythm: Normal rate and regular rhythm.  Pulmonary:     Effort: Pulmonary effort is normal. No respiratory distress.  Skin:    General: Skin is warm and dry.  Neurological:     Mental Status: She is alert and oriented to person, place, and time.  Psychiatric:        Mood and Affect: Mood and affect normal.        Behavior: Behavior normal.        Thought Content: Thought content normal.        Judgment: Judgment normal.     Assessment/Plan:     19 yo following up for cycle irregularities.  Took 5 days of progesterone 10/12-10.17 but then stopped  because she was having nipple swelling and developed a nipple cellulitis and had to take her nipple rings out.  She did have a period in November starting on t 08/27/2020/ She hasd 2 days for bleeding and 8 days of spotting  She is concerned regarding STDs and would like to be tested  She would like to know if she is ovulating.   She feels like she can not get pregnant   She uses condoms for contraception, but does not use condoms every time. Would like to start OCP.   More than 20 minutes were spent face to face with the patient in the room, reviewing the medical record, labs and images, and coordinating care for the patient. The plan of management was discussed in detail and counseling was provided.      Adelene Idler MD [facility], [facility] 09/13/2020 3:23 PM

## 2020-09-22 LAB — NUSWAB VAGINITIS PLUS (VG+)
Candida albicans, NAA: POSITIVE — AB
Candida glabrata, NAA: NEGATIVE
Chlamydia trachomatis, NAA: NEGATIVE
Neisseria gonorrhoeae, NAA: NEGATIVE
Trich vag by NAA: NEGATIVE

## 2020-10-03 ENCOUNTER — Other Ambulatory Visit: Payer: Self-pay

## 2020-10-03 ENCOUNTER — Ambulatory Visit
Admission: EM | Admit: 2020-10-03 | Discharge: 2020-10-03 | Disposition: A | Discharge status: Home / Self Care | Payer: Medicaid Other | Attending: Sports Medicine | Admitting: Sports Medicine

## 2020-10-03 DIAGNOSIS — Z20822 Contact with and (suspected) exposure to covid-19: Secondary | ICD-10-CM | POA: Insufficient documentation

## 2020-10-03 NOTE — Discharge Instructions (Signed)

## 2020-10-03 NOTE — ED Triage Notes (Signed)
Patient here for COVID test only, no symptoms.  

## 2020-10-04 LAB — SARS CORONAVIRUS 2 (TAT 6-24 HRS): SARS Coronavirus 2: NEGATIVE

## 2020-10-06 ENCOUNTER — Telehealth: Payer: Self-pay

## 2020-10-06 NOTE — Telephone Encounter (Signed)
Patient took meds to make her start cycle. She has started her cycle. She's having a lot of pain in her legs and nausea. Inquiring if a side effect of meds. She has a lot of questions. MY#[phone]

## 2020-10-11 ENCOUNTER — Ambulatory Visit (INDEPENDENT_AMBULATORY_CARE_PROVIDER_SITE_OTHER): Payer: Medicaid Other | Admitting: Obstetrics and Gynecology

## 2020-10-11 ENCOUNTER — Ambulatory Visit (INDEPENDENT_AMBULATORY_CARE_PROVIDER_SITE_OTHER): Payer: Medicaid Other

## 2020-10-11 ENCOUNTER — Encounter: Payer: Self-pay | Admitting: Obstetrics and Gynecology

## 2020-10-11 ENCOUNTER — Other Ambulatory Visit: Payer: Self-pay

## 2020-10-11 VITALS — BP 114/70 | Ht 62.0 in | Wt 121.0 lb

## 2020-10-11 DIAGNOSIS — B373 Candidiasis of vulva and vagina: Secondary | ICD-10-CM

## 2020-10-11 DIAGNOSIS — N942 Vaginismus: Secondary | ICD-10-CM | POA: Diagnosis not present

## 2020-10-11 DIAGNOSIS — N921 Excessive and frequent menstruation with irregular cycle: Secondary | ICD-10-CM | POA: Diagnosis not present

## 2020-10-11 DIAGNOSIS — B3731 Acute candidiasis of vulva and vagina: Secondary | ICD-10-CM

## 2020-10-11 DIAGNOSIS — N83209 Unspecified ovarian cyst, unspecified side: Secondary | ICD-10-CM

## 2020-10-11 DIAGNOSIS — N939 Abnormal uterine and vaginal bleeding, unspecified: Secondary | ICD-10-CM

## 2020-10-11 MED ORDER — ESTRADIOL 1 MG PO TABS: 1.0000 mg | ORAL_TABLET | Freq: Every day | ORAL | 0 refills | Status: DC

## 2020-10-11 MED ORDER — FLUCONAZOLE 150 MG PO TABS: 150.0000 mg | ORAL_TABLET | ORAL | 3 refills | Status: AC

## 2020-10-11 MED ORDER — LIDOCAINE 5 % EX OINT: 1.0000 "application " | TOPICAL_OINTMENT | Freq: Every day | CUTANEOUS | 6 refills | Status: DC | PRN

## 2020-10-11 NOTE — Patient Instructions (Signed)
Vulvodynia  Vulvodynia is long-lasting (chronic) pain in the external female genitalia (vulva). This condition may also be referred to as vulvar pain. The vulva includes tissues surrounding the vaginal opening and the urethra. There are two main types of vulvar pain:  Localized vulvar pain. This is pain that affects a specific area of the vulva. ? Vulvar vestibulitis syndrome (VVS) is a type of localized vulvar pain in which pain is only felt around the opening (vestibule) of the vagina.  Generalized vulvar pain. This is pain that affects: ? The entire vulva. ? Multiple areas of the vulva. ? One side of the vulva. What are the causes? The cause of this condition is often not known. Many factors may contribute to it. Vulvar pain may be a type of nerve pain (neuropathic pain). What increases the risk? You are more likely to develop this condition if you have:  Pelvic floor dysfunction. This is a problem in the muscles of your pelvis.  A disorder that affects a protein that is involved in inflammation in the body.  An increased number of pain receptor nerves throughout the body.  A painful bladder.  Irritable bowel syndrome (IBS).  Fibromyalgia. This is a condition that causes chronic pain in many parts of the body.  Restless sleep.  Depression. What are the signs or symptoms? The main symptom of this condition is pain that affects part of the vulva or the entire vulva. Pain may:  Feel like a burning, tearing, stinging, or sharp, knife-like sensation.  Be long-term and ongoing.  Come and go (be intermittent).  Get worse when touching or putting pressure on the vulva, such as when inserting a tampon, having sex, sitting for a long time, or wearing tight pants. Symptoms may also include:  Swelling or redness of the vulva, perineum, or inner thighs.  Psychological or emotional distress. How is this diagnosed? This condition may be diagnosed based on:  Your symptoms and  your medical history. Your health care provider may ask questions about what causes or worsens your pain.  A physical exam of your abdomen and pelvis. This may include a cotton swab test. During this test, your health care provider gently touches all areas of your vulva with a cotton swab to determine where you have pain.  Tests of your vaginal fluid. These tests may be done to check for infection. How is this treated? Treatment for this condition depends on the cause and severity of your pain. Treatment may include:  Caring for your skin in the vulvar area.  Working with a health care provider who specializes in pain.  Physical therapy. This may include doing pelvic floor exercises.  Counseling (psychotherapy). This may include biofeedback. This is a type of therapy that helps you be more aware of your body's response to pain. It also helps you learn to deal with pain.  Complementary medical therapies to help relieve pain, such as: ? Hypnotherapy. This means being put in a state of altered consciousness (hypnosis). ? Acupuncture. This is the insertion of needles into certain places on the skin.  Surgery to remove affected parts of your vulva. Follow these instructions at home: Clothing  Wear cotton underwear.  Avoid wearing tight pants or pantyhose.  Wash clothing in unscented, mild laundry detergent. Do not use fabric softeners or dryer sheets. Eating and drinking  Avoid caffeine.  Avoid foods that are highly processed or high in sugar. Self-care  If any soaps, lotions, creams, or ointments cause or worsen your pain,   do not use these products on the affected areas.  Do not use feminine wipes or douches.  Do not use scented body soaps, scented pads, or scented tampons.  Do not use hot tubs or take hot baths.  Clean your vulvar skin with warm water only and pat the area dry. General instructions  Take over-the-counter and prescription medicines only as told by your  health care provider.  If you have sex, consider using an unscented silicone-based or oil-based lubricant.  Keep all follow-up visits. This is important. Contact a health care provider if:  Your pain gets worse or does not improve after starting treatment.  You develop vaginal discharge that smells bad. Summary  Vulvodynia is long-lasting (chronic) pain in the external female genitalia (vulva). This condition may also be referred to as vulvar pain. The vulva includes tissues surrounding the vaginal opening and the urethra.  Symptoms of this condition may include swelling or redness of the vulva, perineum, or inner thighs.  The cause of this condition is often not known. Many factors may contribute to it. Vulvar pain may be a type of nerve pain (neuropathic pain).  Treatment depends on the cause and severity of your pain. It may include physical therapy, counseling, complementary medical therapies, or working with a pain specialist. Treatment may also include surgery to remove affected parts of your vulva. This information is not intended to replace advice given to you by your health care provider. Make sure you discuss any questions you have with your health care provider. Document Revised: 03/09/2020 Document Reviewed: 03/09/2020 Elsevier Patient Education  2021 [employer]vier Inc[employer]  
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
## 2020-10-13 ENCOUNTER — Other Ambulatory Visit: Payer: Self-pay

## 2020-10-13 ENCOUNTER — Ambulatory Visit: Payer: Medicaid Other | Admitting: Physician Assistant

## 2020-10-13 DIAGNOSIS — Z113 Encounter for screening for infections with a predominantly sexual mode of transmission: Secondary | ICD-10-CM

## 2020-10-13 DIAGNOSIS — Z299 Encounter for prophylactic measures, unspecified: Secondary | ICD-10-CM

## 2020-10-13 LAB — WET PREP FOR TRICH, YEAST, CLUE
Trichomonas Exam: NEGATIVE
Yeast Exam: NEGATIVE

## 2020-10-13 MED ORDER — METRONIDAZOLE 500 MG PO TABS: 2000.0000 mg | ORAL_TABLET | Freq: Once | ORAL | 0 refills | Status: AC

## 2020-10-13 NOTE — Progress Notes (Unsigned)
Patient here for std screening.  Harvie Heck, RN

## 2020-10-14 ENCOUNTER — Encounter: Payer: Self-pay | Admitting: Physician Assistant
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
## 2020-10-15 NOTE — Progress Notes (Signed)
Chart reviewed by Pharmacist  Suzanne Walker PharmD, Contract Pharmacist at [facility]  

## 2020-10-21 ENCOUNTER — Other Ambulatory Visit: Payer: Self-pay

## 2020-10-21 DIAGNOSIS — N939 Abnormal uterine and vaginal bleeding, unspecified: Secondary | ICD-10-CM

## 2020-11-08 NOTE — Telephone Encounter (Signed)
Called patient, she did not answer.

## 2020-11-16 ENCOUNTER — Ambulatory Visit: Payer: Self-pay | Admitting: Adult Health

## 2020-12-08 ENCOUNTER — Other Ambulatory Visit: Payer: Self-pay

## 2020-12-08 ENCOUNTER — Ambulatory Visit: Payer: Medicaid Other

## 2020-12-16 ENCOUNTER — Ambulatory Visit: Payer: Medicaid Other

## 2020-12-17 ENCOUNTER — Ambulatory Visit
Admission: EM | Admit: 2020-12-17 | Discharge: 2020-12-17 | Disposition: A | Discharge status: Home / Self Care | Payer: Medicaid Other | Attending: Physician Assistant | Admitting: Physician Assistant

## 2020-12-17 ENCOUNTER — Other Ambulatory Visit: Payer: Self-pay

## 2020-12-17 ENCOUNTER — Encounter: Payer: Self-pay | Admitting: Emergency Medicine

## 2020-12-17 DIAGNOSIS — J309 Allergic rhinitis, unspecified: Secondary | ICD-10-CM

## 2020-12-17 DIAGNOSIS — R0982 Postnasal drip: Secondary | ICD-10-CM | POA: Diagnosis not present

## 2020-12-17 DIAGNOSIS — J029 Acute pharyngitis, unspecified: Secondary | ICD-10-CM | POA: Diagnosis not present

## 2020-12-17 DIAGNOSIS — Z20822 Contact with and (suspected) exposure to covid-19: Secondary | ICD-10-CM | POA: Diagnosis not present

## 2020-12-17 DIAGNOSIS — R0981 Nasal congestion: Secondary | ICD-10-CM

## 2020-12-17 DIAGNOSIS — Z87891 Personal history of nicotine dependence: Secondary | ICD-10-CM | POA: Insufficient documentation

## 2020-12-17 LAB — GROUP A STREP BY PCR: Group A Strep by PCR: NOT DETECTED

## 2020-12-17 MED ORDER — TRIAMCINOLONE ACETONIDE 55 MCG/ACT NA AERO: 2.0000 | INHALATION_SPRAY | Freq: Every day | NASAL | 1 refills | Status: DC

## 2020-12-17 NOTE — Discharge Instructions (Signed)
Switch to Nasacort and Allegra D or Claritin D. Use OTC nasal saline washes/rinses  You have received COVID testing today either for positive exposure, concerning symptoms that could be related to COVID infection, screening purposes, or re-testing after confirmed positive.  Your test obtained today checks for active viral infection in the last 1-2 weeks. If your test is negative now, you can still test positive later. So, if you do develop symptoms you should either get re-tested and/or isolate x 5 days and then strict mask use x 5 days (unvaccinated) or mask use x 10 days (vaccinated). Please follow CDC guidelines.  While Rapid antigen tests come back in 15-20 minutes, send out PCR/molecular test results typically come back within 1-3 days. In the mean time, if you are symptomatic, assume this could be a positive test and treat/monitor yourself as if you do have COVID.   We will call with test results if positive. Please download the MyChart app and set up a profile to access test results.   If symptomatic, go home and rest. Push fluids. Take Tylenol as needed for discomfort. Gargle warm salt water. Throat lozenges. Take Mucinex DM or Robitussin for cough. Humidifier in bedroom to ease coughing. Warm showers. Also review the COVID handout for more information.  COVID-19 INFECTION: The incubation period of COVID-19 is approximately 14 days after exposure, with most symptoms developing in roughly 4-5 days. Symptoms may range in severity from mild to critically severe. Roughly 80% of those infected will have mild symptoms. People of any age may become infected with COVID-19 and have the ability to transmit the virus. The most common symptoms include: fever, fatigue, cough, body aches, headaches, sore throat, nasal congestion, shortness of breath, nausea, vomiting, diarrhea, changes in smell and/or taste.    COURSE OF ILLNESS Some patients may begin with mild disease which can progress quickly into  critical symptoms. If your symptoms are worsening please call ahead to the Emergency Department and proceed there for further treatment. Recovery time appears to be roughly 1-2 weeks for mild symptoms and 3-6 weeks for severe disease.   GO IMMEDIATELY TO ER FOR FEVER YOU ARE UNABLE TO GET DOWN WITH TYLENOL, BREATHING PROBLEMS, CHEST PAIN, FATIGUE, LETHARGY, INABILITY TO EAT OR DRINK, ETC  QUARANTINE AND ISOLATION: To help decrease the spread of COVID-19 please remain isolated if you have COVID infection or are highly suspected to have COVID infection. This means -stay home and isolate to one room in the home if you live with others. Do not share a bed or bathroom with others while ill, sanitize and wipe down all countertops and keep common areas clean and disinfected. Stay home for 5 days. If you have no symptoms or your symptoms are resolving after 5 days, you can leave your house. Continue to wear a mask around others for 5 additional days. If you have been in close contact (within 6 feet) of someone diagnosed with COVID 19, you are advised to quarantine in your home for 14 days as symptoms can develop anywhere from 2-14 days after exposure to the virus. If you develop symptoms, you  must isolate.  Most current guidelines for COVID after exposure -unvaccinated: isolate 5 days and strict mask use x 5 days. Test on day 5 is possible -vaccinated: wear mask x 10 days if symptoms do not develop -You do not necessarily need to be tested for COVID if you have + exposure and  develop symptoms. Just isolate at home x10 days from symptom onset  During this global pandemic, CDC advises to practice social distancing, try to stay at least 76ft away from others at all times. Wear a face covering. Wash and sanitize your hands regularly and avoid going anywhere that is not necessary.  KEEP IN MIND THAT THE COVID TEST IS NOT 100% ACCURATE AND YOU SHOULD STILL DO EVERYTHING TO PREVENT POTENTIAL SPREAD OF VIRUS TO OTHERS  (WEAR MASK, WEAR GLOVES, WASH HANDS AND SANITIZE REGULARLY). IF INITIAL TEST IS NEGATIVE, THIS MAY NOT MEAN YOU ARE DEFINITELY NEGATIVE. MOST ACCURATE TESTING IS DONE 5-7 DAYS AFTER EXPOSURE.   It is not advised by CDC to get re-tested after receiving a positive COVID test since you can still test positive for weeks to months after you have already cleared the virus.   *If you have not been vaccinated for COVID, I strongly suggest you consider getting vaccinated as long as there are no contraindications.

## 2020-12-17 NOTE — ED Triage Notes (Signed)
   Started 2 days ago with sneezing, watery eyes, & nasal congestion. Tried Dayquil, Xyzal. Flonase & Zyrtec with no relief. Has not had a COVID vaccine or Flu vaccine

## 2020-12-17 NOTE — ED Notes (Signed)
Started 2 days ago with sneezing, watery eyes, & nasal congestion. Tried Dayquil, Xyzal. Flonase & Zyrtec with no relief. Has not had a COVID vaccine or Flu vaccine
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
## 2020-12-18 LAB — SARS CORONAVIRUS 2 (TAT 6-24 HRS): SARS Coronavirus 2: NEGATIVE

## 2020-12-20 ENCOUNTER — Encounter: Payer: Self-pay | Admitting: Obstetrics and Gynecology

## 2020-12-20 ENCOUNTER — Other Ambulatory Visit: Payer: Self-pay

## 2020-12-20 ENCOUNTER — Ambulatory Visit (INDEPENDENT_AMBULATORY_CARE_PROVIDER_SITE_OTHER): Payer: Medicaid Other | Admitting: Obstetrics and Gynecology

## 2020-12-20 VITALS — BP 112/70 | Ht 62.0 in | Wt 118.0 lb

## 2020-12-20 DIAGNOSIS — N760 Acute vaginitis: Secondary | ICD-10-CM

## 2020-12-20 DIAGNOSIS — R875 Abnormal microbiological findings in specimens from female genital organs: Secondary | ICD-10-CM | POA: Diagnosis not present

## 2020-12-20 MED ORDER — FLUCONAZOLE 150 MG PO TABS: 150.0000 mg | ORAL_TABLET | ORAL | 0 refills | Status: DC

## 2020-12-20 NOTE — Patient Instructions (Signed)
General vulvovaginal hygiene measures Keep vulva clean, dry, and well aerated  0 Avoid sleeper pajamas. Nightgowns allow air to circulate.  0 Cotton underpants. Double-rinse underwear after washing to avoid residual irritants. Do not use fabric softeners for underwear and swimsuits.  0 Avoid tights, leotards, and leggings. Skirts and loose-fitting pants allow air to circulate.  0 Avoid sitting in wet swimsuits for long periods of time.  Daily warm bathing   0 Do not use bubble baths or perfumed soaps.  0 soak in clean water (no soap) for 10 to 15 minutes.  0 Use soap to wash regions other than the genital area just before getting out of the tub. Limit use of any soap on genital areas.  0 Rinse the genital area well and gently pat dry.  0 A hair dryer on the cool setting may be helpful to assist with drying the genital region.  0 If the vulvar area is tender or swollen, cool compresses may relieve the discomfort. Emollients may help protect skin.  Review toilet hygiene    1 Sit with knees apart to reduce reflux of urine into the vagina.    0 Emphasize wiping front to back after bowel movements.  0 Wet wipes can be used instead of toilet paper for wiping as long as they don't cause a "stinging" sensation.     

## 2020-12-20 NOTE — Progress Notes (Signed)
Patient ID: Carmen Black, female   DOB: July 02, 2001, 20 y.o.   MRN: [id number]  Reason for Consult: Gynecologic Exam   Referred by Adelene Idler R, *  Subjective:     HPI:  Carmen Black is a 20 y.o. female. She reports that she felt bumps on the outside of her vagina earlier this week. She was not sure what this was.   She reports she used lidocaine and this helped with tampon insertion  She reports she feels like she has been anxious and lost weight related to anxiety and depression. She denies suicidal ideation. She reports that she has been working to improve her mood in the last month and has been feeling significantly better.   She also requests STD screening, recent infidelity by her partner.   Past Medical History:  Diagnosis Date  . ADHD (attention deficit hyperactivity disorder)   . Anxiety   . Compulsive behavior disorder (HCC)   . Depression   . Headache    Family History  Problem Relation Age of Onset  . Diabetes Mother   . Hypertension Mother   . Bipolar disorder Mother   . Deep vein thrombosis Mother   . Migraines Mother   . Seizures Mother   . Colon cancer Father   . Schizophrenia Paternal Aunt   . Clotting disorder Paternal Aunt        blood clots in legs  . Heart disease Maternal Grandmother   . Hepatitis Maternal Grandmother   . Clotting disorder Maternal Grandmother        blood clots in legs  . Depression Half-Brother   . Depression Half-Sister   . Kidney disease Maternal Aunt   . Kidney disease Paternal Uncle   . Hepatitis Paternal Grandmother   . Heart disease Paternal Grandmother    Past Surgical History:  Procedure Laterality Date  . NO PAST SURGERIES      Short Social History:  Social History   Tobacco Use  . Smoking status: Former Smoker    Types: E-cigarettes  . Smokeless tobacco: Never Used  . Tobacco comment: Vapes  Substance Use Topics  . Alcohol use: Yes    No Known Allergies  Current Outpatient Medications   Medication Sig Dispense Refill  . fluticasone (FLONASE) 50 MCG/ACT nasal spray Place 2 sprays into both nostrils daily.    . norethindrone-ethinyl estradiol (JUNEL FE 1/20) 1-20 MG-MCG tablet Take 1 tablet by mouth daily. 28 tablet 11  . triamcinolone (NASACORT) 55 MCG/ACT AERO nasal inhaler Place 2 sprays into the nose daily for 15 days. 1 each 1  . estradiol (ESTRACE) 1 MG tablet Take 1 tablet (1 mg total) by mouth daily for 10 days. 10 tablet 0  . lidocaine (XYLOCAINE) 5 % ointment Apply 1 application topically daily as needed. (Patient not taking: Reported on 12/20/2020) 35.44 g 6  . medroxyPROGESTERone (PROVERA) 10 MG tablet Take 1 tablet (10 mg total) by mouth daily. (Patient not taking: Reported on 12/20/2020) 10 tablet 0  . medroxyPROGESTERone (PROVERA) 10 MG tablet Take 1 tablet (10 mg total) by mouth daily. Use for ten days (Patient not taking: Reported on 12/20/2020) 10 tablet 2  . mupirocin ointment (BACTROBAN) 2 % Apply to affected area 3 times daily (Patient not taking: Reported on 12/20/2020) 22 g 0  . sulfamethoxazole-trimethoprim (BACTRIM DS) 800-160 MG tablet Take 1 tablet by mouth 2 (two) times daily. (Patient not taking: Reported on 12/20/2020) 20 tablet 0   No current facility-administered medications for  this visit.    Review of Systems  Constitutional: Negative for chills, fatigue, fever and unexpected weight change.  HENT: Negative for trouble swallowing.  Eyes: Negative for loss of vision.  Respiratory: Negative for cough, shortness of breath and wheezing.  Cardiovascular: Negative for chest pain, leg swelling, palpitations and syncope.  GI: Negative for abdominal pain, blood in stool, diarrhea, nausea and vomiting.  GU: Negative for difficulty urinating, dysuria, frequency and hematuria.  Musculoskeletal: Negative for back pain, leg pain and joint pain.  Skin: Negative for rash.  Neurological: Negative for dizziness, headaches, light-headedness, numbness and seizures.   Psychiatric: Negative for behavioral problem, confusion, depressed mood and sleep disturbance.        Objective:  Objective   Vitals:   12/20/20 1008  BP: 112/70  Weight: 118 lb (53.5 kg)  Height: 5\' 2"  (1.575 m)   Body mass index is 21.58 kg/m.  Physical Exam Vitals and nursing note reviewed. Exam conducted with a chaperone present.  Constitutional:      Appearance: Normal appearance. She is well-developed.  HENT:     Head: Normocephalic and atraumatic.  Eyes:     Extraocular Movements: Extraocular movements intact.     Pupils: Pupils are equal, round, and reactive to light.  Cardiovascular:     Rate and Rhythm: Normal rate and regular rhythm.  Pulmonary:     Effort: Pulmonary effort is normal. No respiratory distress.     Breath sounds: Normal breath sounds.  Abdominal:     General: Abdomen is flat.     Palpations: Abdomen is soft.  Genitourinary:    Comments: External: Normal appearing vulva. No lesions noted.  White clumpy discharge at introitus. Mild redness of perineal skin.   Musculoskeletal:        General: No signs of injury.  Skin:    General: Skin is warm and dry.  Neurological:     Mental Status: She is alert and oriented to person, place, and time.  Psychiatric:        Behavior: Behavior normal.        Thought Content: Thought content normal.        Judgment: Judgment normal.     Assessment/Plan:    20 yo with vaginitis Will send nuswab for vaginitis and STD screening.  Appearance of discharge suggests reoccurrence of yeast infection.  Will treat with diflucan, weekly treatment for suppression.  Provided patient with a list of hygiene measures.   Follow up as needed.  More than 20 minutes were spent face to face with the patient in the room, reviewing the medical record, labs and images, and coordinating care for the patient. The plan of management was discussed in detail and counseling was provided.    26 MD [facility], [facility] 12/20/2020 10:20 AM

## 2020-12-24 LAB — NUSWAB VAGINITIS PLUS (VG+)
Candida albicans, NAA: POSITIVE — AB
Candida glabrata, NAA: NEGATIVE
Chlamydia trachomatis, NAA: NEGATIVE
Neisseria gonorrhoeae, NAA: NEGATIVE
Trich vag by NAA: NEGATIVE

## 2021-01-09 ENCOUNTER — Ambulatory Visit: Payer: Medicaid Other | Admitting: Obstetrics and Gynecology

## 2021-01-13 ENCOUNTER — Ambulatory Visit
Admission: EM | Admit: 2021-01-13 | Discharge: 2021-01-13 | Disposition: A | Discharge status: Home / Self Care | Payer: Medicaid Other | Attending: Sports Medicine | Admitting: Sports Medicine

## 2021-01-13 ENCOUNTER — Other Ambulatory Visit: Payer: Self-pay

## 2021-01-13 ENCOUNTER — Encounter: Payer: Self-pay | Admitting: Emergency Medicine

## 2021-01-13 DIAGNOSIS — K219 Gastro-esophageal reflux disease without esophagitis: Secondary | ICD-10-CM | POA: Diagnosis present

## 2021-01-13 DIAGNOSIS — N926 Irregular menstruation, unspecified: Secondary | ICD-10-CM | POA: Insufficient documentation

## 2021-01-13 DIAGNOSIS — R109 Unspecified abdominal pain: Secondary | ICD-10-CM | POA: Insufficient documentation

## 2021-01-13 DIAGNOSIS — R112 Nausea with vomiting, unspecified: Secondary | ICD-10-CM | POA: Insufficient documentation

## 2021-01-13 DIAGNOSIS — N760 Acute vaginitis: Secondary | ICD-10-CM | POA: Diagnosis not present

## 2021-01-13 LAB — URINALYSIS, COMPLETE (UACMP) WITH MICROSCOPIC
Bacteria, UA: NONE SEEN
Bilirubin Urine: NEGATIVE
Glucose, UA: NEGATIVE mg/dL
Hgb urine dipstick: NEGATIVE
Ketones, ur: NEGATIVE mg/dL
Leukocytes,Ua: NEGATIVE
Nitrite: NEGATIVE
Protein, ur: NEGATIVE mg/dL
Specific Gravity, Urine: 1.02 (ref 1.005–1.030)
Squamous Epithelial / HPF: NONE SEEN (ref 0–5)
pH: 6 (ref 5.0–8.0)

## 2021-01-13 LAB — COMPREHENSIVE METABOLIC PANEL
ALT: 14 U/L (ref 0–44)
AST: 16 U/L (ref 15–41)
Albumin: 4.5 g/dL (ref 3.5–5.0)
Alkaline Phosphatase: 32 U/L — ABNORMAL LOW (ref 38–126)
Anion gap: 5 (ref 5–15)
BUN: 16 mg/dL (ref 6–20)
CO2: 27 mmol/L (ref 22–32)
Calcium: 9.1 mg/dL (ref 8.9–10.3)
Chloride: 105 mmol/L (ref 98–111)
Creatinine, Ser: 0.81 mg/dL (ref 0.44–1.00)
GFR, Estimated: >60 mL/min (ref >60)
Glucose, Bld: 87 mg/dL (ref 70–99)
Potassium: 3.8 mmol/L (ref 3.5–5.1)
Sodium: 137 mmol/L (ref 135–145)
Total Bilirubin: 0.8 mg/dL (ref 0.3–1.2)
Total Protein: 7 g/dL (ref 6.5–8.1)

## 2021-01-13 LAB — WET PREP, GENITAL
Sperm: NONE SEEN
Trich, Wet Prep: NONE SEEN
WBC, Wet Prep HPF POC: NONE SEEN

## 2021-01-13 LAB — CBC WITH DIFFERENTIAL/PLATELET
Abs Immature Granulocytes: 0.01 10*3/uL (ref 0.00–0.07)
Basophils Absolute: 0.1 10*3/uL (ref 0.0–0.1)
Basophils Relative: 1 %
Eosinophils Absolute: 0.1 10*3/uL (ref 0.0–0.5)
Eosinophils Relative: 2 %
HCT: 39.1 % (ref 36.0–46.0)
Hemoglobin: 13.4 g/dL (ref 12.0–15.0)
Immature Granulocytes: 0 %
Lymphocytes Relative: 37 %
Lymphs Abs: 2.3 10*3/uL (ref 0.7–4.0)
MCH: 31 pg (ref 26.0–34.0)
MCHC: 34.3 g/dL (ref 30.0–36.0)
MCV: 90.5 fL (ref 80.0–100.0)
Monocytes Absolute: 0.8 10*3/uL (ref 0.1–1.0)
Monocytes Relative: 12 %
Neutro Abs: 3 10*3/uL (ref 1.7–7.7)
Neutrophils Relative %: 48 %
Platelets: 260 10*3/uL (ref 150–400)
RBC: 4.32 MIL/uL (ref 3.87–5.11)
RDW: 12.6 % (ref 11.5–15.5)
WBC: 6.3 10*3/uL (ref 4.0–10.5)
nRBC: 0 % (ref 0.0–0.2)

## 2021-01-13 LAB — PREGNANCY, URINE: Preg Test, Ur: NEGATIVE

## 2021-01-13 MED ORDER — METRONIDAZOLE 500 MG PO TABS: 500.0000 mg | ORAL_TABLET | Freq: Two times a day (BID) | ORAL | 0 refills | Status: AC

## 2021-01-13 MED ORDER — FLUCONAZOLE 150 MG PO TABS: ORAL_TABLET | ORAL | 0 refills | Status: DC

## 2021-01-13 MED ORDER — ONDANSETRON 4 MG PO TBDP: 4.0000 mg | ORAL_TABLET | Freq: Three times a day (TID) | ORAL | 0 refills | Status: AC | PRN

## 2021-01-13 MED ORDER — OMEPRAZOLE 20 MG PO CPDR: 20.0000 mg | DELAYED_RELEASE_CAPSULE | Freq: Every day | ORAL | 1 refills | Status: DC

## 2021-01-13 NOTE — Discharge Instructions (Signed)
Your labs show that you have a bacterial vaginal infection as well as yeast infection.  I have sent metronidazole or Flagyl to the pharmacy to treat the BV infection and Diflucan for the yeast infection.  The other labs were all normal.  Given your symptoms and her history of acid reflux, suspect some your symptoms may be due to your acid reflux I have sent omeprazole.  You can take Zofran as needed for the nausea.  You should increase your rest and fluids.  Go to the emergency department for any fevers or severe abdominal pain or weakness.
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
## 2021-01-13 NOTE — ED Triage Notes (Signed)
Patient in today c/o nausea and emesis x 3 days. Patient states her symptoms are worse in the morning or late at night. Patient states she is not throwing up food. She states she is throwing up yellow liquid that is "stringy". Patient states she gets hot during theses episode, but then cools back down. Patient denies fever.

## 2021-01-17 ENCOUNTER — Ambulatory Visit: Payer: Medicaid Other | Admitting: Obstetrics and Gynecology

## 2021-01-17 NOTE — Progress Notes (Deleted)
Natale Milch, MD   No chief complaint on file.   HPI:      [name] is a 20 y.o. G0P0000 whose LMP was No LMP recorded. (Menstrual status: Irregular Periods)., presents today for ***  Treated for BV and yeast at urgent care 01/13/21 with flagyl and diflucan  Past Medical History:  Diagnosis Date  . ADHD (attention deficit hyperactivity disorder)   . Anxiety   . Compulsive behavior disorder (HCC)   . Depression   . Headache     Past Surgical History:  Procedure Laterality Date  . NO PAST SURGERIES      Family History  Problem Relation Age of Onset  . Diabetes Mother   . Hypertension Mother   . Bipolar disorder Mother   . Deep vein thrombosis Mother   . Migraines Mother   . Seizures Mother   . Colon cancer Father   . Schizophrenia Paternal Aunt   . Clotting disorder Paternal Aunt        blood clots in legs  . Heart disease Maternal Grandmother   . Hepatitis Maternal Grandmother   . Clotting disorder Maternal Grandmother        blood clots in legs  . Depression Half-Brother   . Depression Half-Sister   . Kidney disease Maternal Aunt   . Kidney disease Paternal Uncle   . Hepatitis Paternal Grandmother   . Heart disease Paternal Grandmother     Social History   Socioeconomic History  . Marital status: Single    Spouse name: Not on file  . Number of children: Not on file  . Years of education: Not on file  . Highest education level: Not on file  Occupational History  . Not on file  Tobacco Use  . Smoking status: Never Smoker  . Smokeless tobacco: Never Used  Vaping Use  . Vaping Use: Every day  . Substances: Nicotine, Flavoring  Substance and Sexual Activity  . Alcohol use: Yes    Comment: social  . Drug use: Yes    Frequency: 1.0 times per week    Types: Marijuana, MDMA (Ecstacy)  . Sexual activity: Yes    Partners: Male, Female    Birth control/protection: None, Pill  Other Topics Concern  . Not on file  Social History  Narrative  . Not on file   Social Determinants of Health   Financial Resource Strain: Not on file  Food Insecurity: Not on file  Transportation Needs: Not on file  Physical Activity: Not on file  Stress: Not on file  Social Connections: Not on file  Intimate Partner Violence: Not on file    Outpatient Medications Prior to Visit  Medication Sig Dispense Refill  . estradiol (ESTRACE) 1 MG tablet Take 1 tablet (1 mg total) by mouth daily for 10 days. 10 tablet 0  . fluconazole (DIFLUCAN) 150 MG tablet Take 1 tab PO q72 hr 2 tablet 0  . fluticasone (FLONASE) 50 MCG/ACT nasal spray Place 2 sprays into both nostrils daily.    Marland Kitchen lidocaine (XYLOCAINE) 5 % ointment Apply 1 application topically daily as needed. (Patient not taking: No sig reported) 35.44 g 6  . medroxyPROGESTERone (PROVERA) 10 MG tablet Take 1 tablet (10 mg total) by mouth daily. (Patient not taking: No sig reported) 10 tablet 0  . medroxyPROGESTERone (PROVERA) 10 MG tablet Take 1 tablet (10 mg total) by mouth daily. Use for ten days (Patient not taking: No sig reported) 10 tablet 2  .  metroNIDAZOLE (FLAGYL) 500 MG tablet Take 1 tablet (500 mg total) by mouth 2 (two) times daily for 7 days. 14 tablet 0  . mupirocin ointment (BACTROBAN) 2 % Apply to affected area 3 times daily (Patient not taking: No sig reported) 22 g 0  . norethindrone-ethinyl estradiol (JUNEL FE 1/20) 1-20 MG-MCG tablet Take 1 tablet by mouth daily. 28 tablet 11  . omeprazole (PRILOSEC) 20 MG capsule Take 1 capsule (20 mg total) by mouth daily for 15 days. 15 capsule 1  . ondansetron (ZOFRAN ODT) 4 MG disintegrating tablet Take 1 tablet (4 mg total) by mouth every 8 (eight) hours as needed for up to 5 days for nausea or vomiting. 15 tablet 0  . sulfamethoxazole-trimethoprim (BACTRIM DS) 800-160 MG tablet Take 1 tablet by mouth 2 (two) times daily. (Patient not taking: No sig reported) 20 tablet 0  . triamcinolone (NASACORT) 55 MCG/ACT AERO nasal inhaler Place 2  sprays into the nose daily for 15 days. 1 each 1   No facility-administered medications prior to visit.      ROS:  Review of Systems BREAST: No symptoms   OBJECTIVE:   Vitals:  There were no vitals taken for this visit.  Physical Exam  Results: No results found for this or any previous visit (from the past 24 hour(s)).   Assessment/Plan: No diagnosis found.    No orders of the defined types were placed in this encounter.     No follow-ups on file.  Lyris Hitchman B. Alondra Sahni, PA-C 01/17/2021 11:00 AM

## 2021-02-06 NOTE — Progress Notes (Addendum)
Natale Milch, MD   Chief Complaint  Patient presents with  . STD testing  . Breast Exam    Tenderness x 2 months    HPI:      [name] is a 20 y.o. G0P0000 whose LMP was No LMP recorded. (Menstrual status: Irregular Periods)., presents today for bilat nipple tenderness and STD testing. Pt with bilat nipple pain for past 2 months. No erythema, trauma, masses. Drinks caffeine all day. Hx of nipple piercing twice with cellulitis a few months ago. Pt was doing cont dosing OCPs and hasn't had a period in months. Stopped them 3/22 due to nipple pain. Had light spotting for a day or so 1/22 and 2/22. Had neg UPT 3/22.  Would also like STD testing due to increased white vag d/c, no itch/irritation, no fishy odor. She is sex active, not using condoms. Neg STD testing 3/22; HIV/RPR neg 10/21. Treated for yeast and BV 4/22 in ED.  On OCPs for primary amenorrhea, started last yr with Dr. Jerene Pitch. Did depo in past too. Doing cont dosing of pills and not having periods. Concerned why she still isn't bleeding.    Past Medical History:  Diagnosis Date  . ADHD (attention deficit hyperactivity disorder)   . Anxiety   . Compulsive behavior disorder (HCC)   . Depression   . Headache     Past Surgical History:  Procedure Laterality Date  . NO PAST SURGERIES      Family History  Problem Relation Age of Onset  . Diabetes Mother   . Hypertension Mother   . Bipolar disorder Mother   . Deep vein thrombosis Mother   . Migraines Mother   . Seizures Mother   . Cervical cancer Mother        30s  . Colon cancer Father   . Schizophrenia Paternal Aunt   . Clotting disorder Paternal Aunt        blood clots in legs  . Heart disease Maternal Grandmother   . Hepatitis Maternal Grandmother   . Clotting disorder Maternal Grandmother        blood clots in legs  . Depression Half-Brother   . Depression Half-Sister   . Kidney disease Maternal Aunt   . Kidney disease Paternal Uncle    . Hepatitis Paternal Grandmother   . Heart disease Paternal Grandmother     Social History   Socioeconomic History  . Marital status: Single    Spouse name: Not on file  . Number of children: Not on file  . Years of education: Not on file  . Highest education level: Not on file  Occupational History  . Not on file  Tobacco Use  . Smoking status: Never Smoker  . Smokeless tobacco: Never Used  Vaping Use  . Vaping Use: Every day  . Substances: Nicotine, Flavoring  Substance and Sexual Activity  . Alcohol use: Yes    Comment: social  . Drug use: Yes    Frequency: 1.0 times per week    Types: Marijuana, MDMA (Ecstacy)  . Sexual activity: Yes    Partners: Male, Female    Birth control/protection: None  Other Topics Concern  . Not on file  Social History Narrative  . Not on file   Social Determinants of Health   Financial Resource Strain: Not on file  Food Insecurity: Not on file  Transportation Needs: Not on file  Physical Activity: Not on file  Stress: Not on file  Social Connections:  Not on file  Intimate Partner Violence: Not on file    Outpatient Medications Prior to Visit  Medication Sig Dispense Refill  . fluticasone (FLONASE) 50 MCG/ACT nasal spray Place 2 sprays into both nostrils daily.    . norethindrone-ethinyl estradiol (JUNEL FE 1/20) 1-20 MG-MCG tablet Take 1 tablet by mouth daily. (Patient not taking: Reported on 02/07/2021) 28 tablet 11  . triamcinolone (NASACORT) 55 MCG/ACT AERO nasal inhaler Place 2 sprays into the nose daily for 15 days. 1 each 1  . estradiol (ESTRACE) 1 MG tablet Take 1 tablet (1 mg total) by mouth daily for 10 days. 10 tablet 0  . fluconazole (DIFLUCAN) 150 MG tablet Take 1 tab PO q72 hr 2 tablet 0  . lidocaine (XYLOCAINE) 5 % ointment Apply 1 application topically daily as needed. (Patient not taking: No sig reported) 35.44 g 6  . medroxyPROGESTERone (PROVERA) 10 MG tablet Take 1 tablet (10 mg total) by mouth daily. (Patient not  taking: No sig reported) 10 tablet 0  . medroxyPROGESTERone (PROVERA) 10 MG tablet Take 1 tablet (10 mg total) by mouth daily. Use for ten days (Patient not taking: No sig reported) 10 tablet 2  . mupirocin ointment (BACTROBAN) 2 % Apply to affected area 3 times daily (Patient not taking: No sig reported) 22 g 0  . omeprazole (PRILOSEC) 20 MG capsule Take 1 capsule (20 mg total) by mouth daily for 15 days. 15 capsule 1  . sulfamethoxazole-trimethoprim (BACTRIM DS) 800-160 MG tablet Take 1 tablet by mouth 2 (two) times daily. (Patient not taking: No sig reported) 20 tablet 0   No facility-administered medications prior to visit.      ROS:  Review of Systems  Constitutional: Negative for fever.  Gastrointestinal: Negative for blood in stool, constipation, diarrhea, nausea and vomiting.  Genitourinary: Positive for menstrual problem and vaginal discharge. Negative for dyspareunia, dysuria, flank pain, frequency, hematuria, urgency, vaginal bleeding and vaginal pain.  Musculoskeletal: Negative for back pain.  Skin: Negative for rash.   BREAST: nipple pain   OBJECTIVE:   Vitals:  BP 110/64   Ht 5\' 2"  (1.575 m)   Wt 115 lb (52.2 kg)   BMI 21.03 kg/m   Physical Exam Vitals reviewed.  Constitutional:      Appearance: She is well-developed.  Pulmonary:     Effort: Pulmonary effort is normal.  Chest:  Breasts: Breasts are symmetrical.     Right: Tenderness present. No inverted nipple, mass, nipple discharge or skin change.     Left: Tenderness present. No inverted nipple, mass, nipple discharge or skin change.      Comments: NEG BREAT EXAM BILAT EXCEPT NIPPLE TENDERNESS Genitourinary:    General: Normal vulva.     Pubic Area: No rash.      Labia:        Right: No rash, tenderness or lesion.        Left: No rash, tenderness or lesion.      Vagina: Vaginal discharge present. No erythema or tenderness.     Cervix: Normal.     Uterus: Normal. Not enlarged and not tender.       Adnexa: Right adnexa normal and left adnexa normal.       Right: No mass or tenderness.         Left: No mass or tenderness.       Comments: WHITE VAG D/C Musculoskeletal:        General: Normal range of motion.     Cervical back:  Normal range of motion.  Skin:    General: Skin is warm and dry.  Neurological:     General: No focal deficit present.     Mental Status: She is alert and oriented to person, place, and time.     Cranial Nerves: No cranial nerve deficit.  Psychiatric:        Mood and Affect: Mood normal.        Behavior: Behavior normal.        Thought Content: Thought content normal.        Judgment: Judgment normal.    Results for orders placed or performed in visit on 02/07/21 (from the past 24 hour(s))  POCT urine pregnancy     Status: Normal   Collection Time: 02/07/21 11:40 AM  Result Value Ref Range   Preg Test, Ur Negative Negative    Assessment/Plan: Breast tenderness - Plan: POCT urine pregnancy, neg UPT, neg exam. Question caffeine use vs no menses. D/C caffeine. F/u prn. May need Vit E supp.   Vaginal discharge--normal d/c on exam. Check STDs. Will f/u if pos. If neg, reassurance.  Screening for STD (sexually transmitted disease) - Plan: Cervicovaginal ancillary only  Late menses - Plan: POCT urine pregnancy; neg UPT. Pt stopped OCPs 3/22. Hx of primary amenorrhea. Could be delayed due to OCP use. Can f/u with Dr. Jerene Pitch for further mgmt.    Return if symptoms worsen or fail to improve.  Cydnie Deason B. Keilana Morlock, PA-C 02/07/2021 11:40 AM

## 2021-02-07 ENCOUNTER — Ambulatory Visit (INDEPENDENT_AMBULATORY_CARE_PROVIDER_SITE_OTHER): Payer: Medicaid Other | Admitting: Obstetrics and Gynecology

## 2021-02-07 ENCOUNTER — Encounter: Payer: Self-pay | Admitting: Obstetrics and Gynecology

## 2021-02-07 ENCOUNTER — Other Ambulatory Visit: Payer: Self-pay

## 2021-02-07 ENCOUNTER — Other Ambulatory Visit (HOSPITAL_COMMUNITY)
Admission: RE | Admit: 2021-02-07 | Discharge: 2021-02-07 | Disposition: A | Discharge status: Home / Self Care | Payer: Medicaid Other | Source: Ambulatory Visit | Attending: Obstetrics and Gynecology | Admitting: Obstetrics and Gynecology

## 2021-02-07 VITALS — BP 110/64 | Ht 62.0 in | Wt 115.0 lb

## 2021-02-07 DIAGNOSIS — Z113 Encounter for screening for infections with a predominantly sexual mode of transmission: Secondary | ICD-10-CM | POA: Diagnosis not present

## 2021-02-07 DIAGNOSIS — N926 Irregular menstruation, unspecified: Secondary | ICD-10-CM

## 2021-02-07 DIAGNOSIS — N644 Mastodynia: Secondary | ICD-10-CM

## 2021-02-07 DIAGNOSIS — N898 Other specified noninflammatory disorders of vagina: Secondary | ICD-10-CM

## 2021-02-07 LAB — POCT URINE PREGNANCY: Preg Test, Ur: NEGATIVE

## 2021-02-07 NOTE — Patient Instructions (Signed)
I value your feedback and you entrusting us with your care. If you get a [facility] patient survey, I would appreciate you taking the time to let us know about your experience today. Thank you! ? ? ?

## 2021-02-09 LAB — CERVICOVAGINAL ANCILLARY ONLY
Chlamydia: NEGATIVE
Comment: NEGATIVE
Comment: NEGATIVE
Comment: NORMAL
Neisseria Gonorrhea: NEGATIVE
Trichomonas: NEGATIVE

## 2021-02-15 ENCOUNTER — Other Ambulatory Visit: Payer: Self-pay

## 2021-02-15 ENCOUNTER — Encounter: Payer: Self-pay | Admitting: Obstetrics and Gynecology

## 2021-02-15 ENCOUNTER — Ambulatory Visit: Payer: Medicaid Other | Admitting: Obstetrics and Gynecology

## 2021-02-15 ENCOUNTER — Ambulatory Visit (INDEPENDENT_AMBULATORY_CARE_PROVIDER_SITE_OTHER): Payer: Medicaid Other

## 2021-02-15 ENCOUNTER — Ambulatory Visit (INDEPENDENT_AMBULATORY_CARE_PROVIDER_SITE_OTHER): Payer: Medicaid Other | Admitting: Obstetrics and Gynecology

## 2021-02-15 VITALS — BP 100/60 | Ht 62.0 in | Wt 113.0 lb

## 2021-02-15 DIAGNOSIS — Z30013 Encounter for initial prescription of injectable contraceptive: Secondary | ICD-10-CM | POA: Diagnosis not present

## 2021-02-15 DIAGNOSIS — N921 Excessive and frequent menstruation with irregular cycle: Secondary | ICD-10-CM

## 2021-02-15 DIAGNOSIS — Z3042 Encounter for surveillance of injectable contraceptive: Secondary | ICD-10-CM

## 2021-02-15 MED ORDER — MEDROXYPROGESTERONE ACETATE 150 MG/ML IM SUSP
150.0000 mg | Freq: Once | INTRAMUSCULAR | Status: AC
  Administered 2021-02-15: 150 mg via INTRAMUSCULAR

## 2021-02-15 MED ORDER — MEDROXYPROGESTERONE ACETATE 150 MG/ML IM SUSP: 150.0000 mg | INTRAMUSCULAR | 0 refills | Status: DC

## 2021-02-15 MED ORDER — MEDROXYPROGESTERONE ACETATE 150 MG/ML IM SUSP: 150.0000 mg | INTRAMUSCULAR | 2 refills | Status: DC

## 2021-02-15 NOTE — Patient Instructions (Signed)
I value your feedback and you entrusting us with your care. If you get a [facility] patient survey, I would appreciate you taking the time to let us know about your experience today. Thank you! ? ? ?

## 2021-02-15 NOTE — Progress Notes (Signed)
Pt here for depo which was given IM right glut.  NDC# 66993-370-83 

## 2021-02-15 NOTE — Progress Notes (Signed)
Carmen Milch, MD   Chief Complaint  Patient presents with  . Vaginal Bleeding    3 full pads in 1 hr on 5/15, no abnormal pain    HPI:      [name] is a 20 y.o. G0P0000 whose LMP was Patient's last menstrual period was 02/11/2021 (exact date)., presents today for menorrhagia. Pt has been off OCPs since 3/22 and hasn't had a period since; had neg UPT 02/07/21 and 3/22. Started bleeding a few days ago. First day was very heavy, saturating several pads in an hr, with small clots. Had LBP improved with NSAIDs. Bleeding lightened up after a day and is now dark brown spotting only. No pelvic pain. Neg gon/chlam 02/07/21, neg UPT 02/07/21. Pt would like to restart BC and do depo again. Had Rx for OCPs. Had irregular bleeding and severe dysmen with menses prior to OCPs. She is sex active, not using condoms.   Past Medical History:  Diagnosis Date  . ADHD (attention deficit hyperactivity disorder)   . Anxiety   . Compulsive behavior disorder (HCC)   . Depression   . Headache     Past Surgical History:  Procedure Laterality Date  . NO PAST SURGERIES      Family History  Problem Relation Age of Onset  . Diabetes Mother   . Hypertension Mother   . Bipolar disorder Mother   . Deep vein thrombosis Mother   . Migraines Mother   . Seizures Mother   . Cervical cancer Mother        30s  . Colon cancer Father   . Schizophrenia Paternal Aunt   . Clotting disorder Paternal Aunt        blood clots in legs  . Heart disease Maternal Grandmother   . Hepatitis Maternal Grandmother   . Clotting disorder Maternal Grandmother        blood clots in legs  . Depression Half-Brother   . Depression Half-Sister   . Kidney disease Maternal Aunt   . Kidney disease Paternal Uncle   . Hepatitis Paternal Grandmother   . Heart disease Paternal Grandmother     Social History   Socioeconomic History  . Marital status: Single    Spouse name: Not on file  . Number of children:  Not on file  . Years of education: Not on file  . Highest education level: Not on file  Occupational History  . Not on file  Tobacco Use  . Smoking status: Never Smoker  . Smokeless tobacco: Never Used  Vaping Use  . Vaping Use: Every day  . Substances: Nicotine, Flavoring  Substance and Sexual Activity  . Alcohol use: Yes    Comment: social  . Drug use: Yes    Frequency: 1.0 times per week    Types: Marijuana, MDMA (Ecstacy)  . Sexual activity: Yes    Partners: Male, Female    Birth control/protection: None  Other Topics Concern  . Not on file  Social History Narrative  . Not on file   Social Determinants of Health   Financial Resource Strain: Not on file  Food Insecurity: Not on file  Transportation Needs: Not on file  Physical Activity: Not on file  Stress: Not on file  Social Connections: Not on file  Intimate Partner Violence: Not on file    Outpatient Medications Prior to Visit  Medication Sig Dispense Refill  . triamcinolone (NASACORT) 55 MCG/ACT AERO nasal inhaler Place 2 sprays into the nose  daily for 15 days. 1 each 1  . fluticasone (FLONASE) 50 MCG/ACT nasal spray Place 2 sprays into both nostrils daily.    . norethindrone-ethinyl estradiol (JUNEL FE 1/20) 1-20 MG-MCG tablet Take 1 tablet by mouth daily. (Patient not taking: No sig reported) 28 tablet 11   No facility-administered medications prior to visit.      ROS:  Review of Systems  Constitutional: Negative for fever.  Gastrointestinal: Negative for blood in stool, constipation, diarrhea, nausea and vomiting.  Genitourinary: Positive for menstrual problem. Negative for dyspareunia, dysuria, flank pain, frequency, hematuria, urgency, vaginal bleeding, vaginal discharge and vaginal pain.  Musculoskeletal: Negative for back pain.  Skin: Negative for rash.    OBJECTIVE:   Vitals:  BP 100/60   Ht 5\' 2"  (1.575 m)   Wt 113 lb (51.3 kg)   LMP 02/11/2021 (Exact Date)   BMI 20.67 kg/m    Physical Exam Vitals reviewed.  Constitutional:      Appearance: She is well-developed.  Pulmonary:     Effort: Pulmonary effort is normal.  Musculoskeletal:        General: Normal range of motion.     Cervical back: Normal range of motion.  Skin:    General: Skin is warm and dry.  Neurological:     General: No focal deficit present.     Mental Status: She is alert and oriented to person, place, and time.     Cranial Nerves: No cranial nerve deficit.  Psychiatric:        Mood and Affect: Mood normal.        Behavior: Behavior normal.        Thought Content: Thought content normal.        Judgment: Judgment normal.     Assessment/Plan: Menorrhagia with irregular cycle--sx for 1 day, have since resolved. No menses for 2 months prior to bleeding. Reassurance. Pt wants to restart depo. Rx eRxd. Do today, condoms for 1 wk. F/u prn.   Encounter for initial prescription of injectable contraceptive - Plan: medroxyPROGESTERone (DEPO-PROVERA) 150 MG/ML injection; 1 Rx eRxd to [facility] in [place] so pt can RTO today for first injection. Subsequent Rx eRxd to [facility] in [place] (pt's preferred pharm).    Meds ordered this encounter  Medications  . DISCONTD: medroxyPROGESTERone (DEPO-PROVERA) 150 MG/ML injection    Sig: Inject 1 mL (150 mg total) into the muscle every 3 (three) months.    Dispense:  1 mL    Refill:  0    Order Specific Question:   Supervising Provider    Answer:   [place] Nadara Mustard  . medroxyPROGESTERone (DEPO-PROVERA) 150 MG/ML injection    Sig: Inject 1 mL (150 mg total) into the muscle every 3 (three) months.    Dispense:  1 mL    Refill:  2    Order Specific Question:   Supervising Provider    Answer:   [id number] Nadara Mustard      Return for with CMA today for depo inj./ prn sx  Duru Reiger B. Jaki Steptoe, PA-C 02/15/2021 2:22 PM

## 2021-02-16 ENCOUNTER — Ambulatory Visit: Payer: Medicaid Other | Admitting: Obstetrics and Gynecology

## 2021-03-13 ENCOUNTER — Ambulatory Visit: Payer: Medicaid Other | Admitting: Obstetrics and Gynecology

## 2021-03-21 ENCOUNTER — Ambulatory Visit (INDEPENDENT_AMBULATORY_CARE_PROVIDER_SITE_OTHER): Payer: Medicaid Other | Admitting: Obstetrics

## 2021-03-21 ENCOUNTER — Other Ambulatory Visit: Payer: Self-pay

## 2021-03-21 ENCOUNTER — Other Ambulatory Visit (HOSPITAL_COMMUNITY)
Admission: RE | Admit: 2021-03-21 | Discharge: 2021-03-21 | Disposition: A | Discharge status: Home / Self Care | Payer: Medicaid Other | Source: Ambulatory Visit | Attending: Obstetrics and Gynecology | Admitting: Obstetrics and Gynecology

## 2021-03-21 ENCOUNTER — Encounter: Payer: Self-pay | Admitting: Obstetrics

## 2021-03-21 VITALS — BP 102/66 | HR 72 | Ht 62.0 in | Wt 111.0 lb

## 2021-03-21 DIAGNOSIS — N898 Other specified noninflammatory disorders of vagina: Secondary | ICD-10-CM | POA: Insufficient documentation

## 2021-03-21 NOTE — Progress Notes (Signed)
Vaginal dryness/irritation ~1.5-2 wks ago has subsided now. Also noticed a white residue in belly button when cleaning. No issues since cleaning. Has had cellulitis of belly button in past. No current problems. Denies itching/odor/discharge.

## 2021-03-21 NOTE — Progress Notes (Signed)
Obstetrics & Gynecology Office Visit   Chief Complaint:  Chief Complaint  Patient presents with   Vaginitis    Vaginal dryness/irritation ~ 1.5-2 wks ago    History of Present Illness: Carmen Black rpesent swith a concern about some vaginal dryness and a white vaginal discharge she has noticed over the last few days.  She is sexually active with one partner, who she shares has previously been unfaithful. She has had routine STI screening in the past, and shares that her partner has also. She has made some contraceptive changes over the last year, from continuous OCPs to Depo provera. She would like STI screening for GC and CZ today and to have the discharge looked at.   Review of Systems:  Review of Systems  Constitutional: Negative.   HENT: Negative.    Eyes: Negative.   Respiratory: Negative.    Cardiovascular: Negative.   Gastrointestinal: Negative.   Genitourinary: Negative.   Musculoskeletal: Negative.   Skin: Negative.        She has some external genital dryness  Neurological: Negative.   Endo/Heme/Allergies: Negative.   Psychiatric/Behavioral: Negative.      Past Medical History:  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HCC)    Depression    Headache     Past Surgical History:  Past Surgical History:  Procedure Laterality Date   NO PAST SURGERIES      Gynecologic History: Patient's last menstrual period was 11/15/2020.  Obstetric History: G0P0000  Family History:  Family History  Problem Relation Age of Onset   Diabetes Mother    Hypertension Mother    Bipolar disorder Mother    Deep vein thrombosis Mother    Migraines Mother    Seizures Mother    Cervical cancer Mother        30s   Colon cancer Father    Schizophrenia Paternal Aunt    Clotting disorder Paternal Aunt        blood clots in legs   Heart disease Maternal Grandmother    Hepatitis Maternal  Grandmother    Clotting disorder Maternal Grandmother        blood clots in legs   Depression Half-Brother    Depression Half-Sister    Kidney disease Maternal Aunt    Kidney disease Paternal Uncle    Hepatitis Paternal Grandmother    Heart disease Paternal Grandmother     Social History:  Social History   Socioeconomic History   Marital status: Single    Spouse name: Not on file   Number of children: Not on file   Years of education: Not on file   Highest education level: Not on file  Occupational History   Not on file  Tobacco Use   Smoking status: Never   Smokeless tobacco: Never  Vaping Use   Vaping Use: Every day   Substances: Nicotine, Flavoring  Substance and Sexual Activity   Alcohol use: Yes    Comment: social   Drug use: Yes    Frequency: 1.0 times per week    Types: Marijuana, MDMA (Ecstacy)   Sexual activity: Yes    Partners: Male, Female    Birth control/protection: None  Other Topics Concern   Not on file  Social History Narrative   Not on file   Social Determinants of Health   Financial Resource Strain: Not on file  Food Insecurity: Not on file  Transportation Needs: Not on file  Physical Activity: Not on file  Stress: Not on file  Social Connections: Not on file  Intimate Partner Violence: Not on file    Allergies:  No Known Allergies  Medications: Prior to Admission medications   Medication Sig Start Date End Date Taking? Authorizing Provider  medroxyPROGESTERone (DEPO-PROVERA) 150 MG/ML injection Inject 1 mL (150 mg total) into the muscle every 3 (three) months. 02/15/21  Yes Copland, Helmut Muster B, PA-C  ondansetron (ZOFRAN-ODT) 8 MG disintegrating tablet Take 8 mg by mouth every 8 (eight) hours as needed for nausea or vomiting.   Yes [provider]  triamcinolone (NASACORT) 55 MCG/ACT AERO nasal inhaler Place 2 sprays into the nose daily for 15 days. 12/17/20 01/01/21  Eusebio Friendly B, PA-C  ESTARYLLA 0.25-35 MG-MCG tablet TAKE 1  TABLET BY MOUTH EVERY DAY AT THE SAME TIME  [facility] Patient not taking: Reported on 11/12/2019 07/07/19 11/29/19  Vena Austria, MD  Levonorgestrel-Ethinyl Estradiol (SEASONIQUE) 0.15-0.03 &0.01 MG tablet Take 1 tablet by mouth daily. Patient not taking: Reported on 11/12/2019 07/20/19 11/29/19  Vena Austria, MD  norelgestromin-ethinyl estradiol [employer]) 150-35 MCG/24HR transdermal patch Place 1 patch onto the skin once a week. Leave patch off on week 4 for 1 week Patient not taking: Reported on 11/12/2019 06/03/19 11/29/19  Vena Austria, MD    Physical Exam Vitals:  Vitals:   03/21/21 1416  BP: 102/66  Pulse: 72   Patient's last menstrual period was 11/15/2020.  Physical Exam Constitutional:      Appearance: Normal appearance.  HENT:     Head: Normocephalic and atraumatic.  Cardiovascular:     Rate and Rhythm: Normal rate and regular rhythm.  Pulmonary:     Effort: Pulmonary effort is normal.     Breath sounds: Normal breath sounds.  Abdominal:     General: Abdomen is flat.     Palpations: Abdomen is soft.  Genitourinary:    General: Normal vulva.     Comments: Shaves entire mons Scant amount of white clumpy discharge noted at the base of the vaginal. Scant white , non malodorous dc noted in the vagina. Normal vaginal walls and rugae. No external rashes, lesions , or areas of irritation.  POCT wet mount shows normal epithelial celss, neg whiff, no yeast or buds noted, no trich, few bacteria. Aptima swab sent. Musculoskeletal:        General: Normal range of motion.     Cervical back: Normal range of motion and neck supple.  Skin:    General: Skin is warm and dry.  Neurological:     General: No focal deficit present.     Mental Status: She is alert and oriented to person, place, and time.  Psychiatric:     Comments: Distractable. Lively  conversation.     Assessment: 20 y.o. G0P0000   Vaginal dryness and discharge Suspect yeast vaginitis   Plan: Problem List  Items Addressed This Visit   None Visit Diagnoses     Vaginal discharge    -  Primary   Relevant Orders   Cervicovaginal ancillary only     jSeh is encouraged to try OTC Monistat. Will contact her with the results of the Swab results. Mirna Mires, CNM  03/21/2021 6:04 PM

## 2021-03-23 LAB — CERVICOVAGINAL ANCILLARY ONLY
Bacterial Vaginitis (gardnerella): POSITIVE — AB
Candida Glabrata: NEGATIVE
Candida Vaginitis: NEGATIVE
Chlamydia: NEGATIVE
Comment: NEGATIVE
Comment: NEGATIVE
Comment: NEGATIVE
Comment: NEGATIVE
Comment: NEGATIVE
Comment: NORMAL
Neisseria Gonorrhea: NEGATIVE
Trichomonas: NEGATIVE

## 2021-03-23 NOTE — Telephone Encounter (Signed)
Patient is calling to follow up on treatment plan. Patient is ware [facility] is out of the office message will be sent to [facility]. She is in the office tomorrow

## 2021-03-23 NOTE — Telephone Encounter (Signed)
This is none emergent and her provider is in the office tomorrow

## 2021-03-24 ENCOUNTER — Other Ambulatory Visit: Payer: Self-pay | Admitting: Obstetrics

## 2021-03-24 DIAGNOSIS — N760 Acute vaginitis: Secondary | ICD-10-CM

## 2021-03-24 DIAGNOSIS — B9689 Other specified bacterial agents as the cause of diseases classified elsewhere: Secondary | ICD-10-CM

## 2021-03-24 MED ORDER — TRIAMCINOLONE ACETONIDE 55 MCG/ACT NA AERO: 2.0000 | INHALATION_SPRAY | Freq: Every day | NASAL | 1 refills | Status: DC

## 2021-03-24 MED ORDER — METRONIDAZOLE 500 MG PO TABS: 500.0000 mg | ORAL_TABLET | Freq: Two times a day (BID) | ORAL | 0 refills | Status: AC

## 2021-03-24 NOTE — Telephone Encounter (Signed)
refilled 

## 2021-03-26 ENCOUNTER — Other Ambulatory Visit: Payer: Self-pay

## 2021-03-26 ENCOUNTER — Encounter: Payer: Self-pay | Admitting: Emergency Medicine

## 2021-03-26 ENCOUNTER — Emergency Department
Admission: EM | Admit: 2021-03-26 | Discharge: 2021-03-26 | Disposition: A | Discharge status: Home / Self Care | Payer: Medicaid Other | Attending: Student in an Organized Health Care Education/Training Program | Admitting: Student in an Organized Health Care Education/Training Program

## 2021-03-26 DIAGNOSIS — Y990 Civilian activity done for income or pay: Secondary | ICD-10-CM | POA: Insufficient documentation

## 2021-03-26 DIAGNOSIS — S61412A Laceration without foreign body of left hand, initial encounter: Secondary | ICD-10-CM | POA: Diagnosis not present

## 2021-03-26 DIAGNOSIS — S6992XA Unspecified injury of left wrist, hand and finger(s), initial encounter: Secondary | ICD-10-CM | POA: Diagnosis present

## 2021-03-26 DIAGNOSIS — W260XXA Contact with knife, initial encounter: Secondary | ICD-10-CM | POA: Insufficient documentation

## 2021-03-26 DIAGNOSIS — Z23 Encounter for immunization: Secondary | ICD-10-CM | POA: Diagnosis not present

## 2021-03-26 MED ORDER — TETANUS-DIPHTH-ACELL PERTUSSIS 5-2.5-18.5 LF-MCG/0.5 IM SUSY
0.5000 mL | PREFILLED_SYRINGE | Freq: Once | INTRAMUSCULAR | Status: AC
  Administered 2021-03-26: 16:00:00 0.5 mL via INTRAMUSCULAR
  Filled 2021-03-26: qty 0.5
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
## 2021-03-27 ENCOUNTER — Other Ambulatory Visit: Payer: Self-pay | Admitting: Obstetrics

## 2021-03-27 NOTE — Progress Notes (Signed)
Prescription for metronidazole sent to the pharmacy as her Aptima swab indicates BV.  Mirna Mires, CNM  03/27/2021 8:40 AM

## 2021-03-30 ENCOUNTER — Other Ambulatory Visit: Payer: Self-pay

## 2021-03-30 ENCOUNTER — Ambulatory Visit
Admission: EM | Admit: 2021-03-30 | Discharge: 2021-03-30 | Disposition: A | Discharge status: Home / Self Care | Payer: Medicaid Other

## 2021-03-30 ENCOUNTER — Other Ambulatory Visit: Payer: Self-pay | Admitting: Obstetrics

## 2021-03-30 ENCOUNTER — Encounter: Payer: Self-pay | Admitting: Emergency Medicine

## 2021-03-30 DIAGNOSIS — S61412A Laceration without foreign body of left hand, initial encounter: Secondary | ICD-10-CM

## 2021-03-30 NOTE — Discharge Instructions (Addendum)
I have removed the skin glue and placed Steri-Strips.  Try to leave those in place in the next couple of days.  Continue to monitor for infection.  The laceration does not look infected.  If you see any redness, swelling, have increased pain or notice any pustular drainage you need to be seen again.  He can take Tylenol and Motrin for any discomfort but you should start to be feeling much better in the next few days.
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
## 2021-03-30 NOTE — ED Triage Notes (Signed)
Pt c/o laceration on her left hand. She was seen at the ED on 03/26/21 dermabond was applied to the area. Pt states yesterday she noticed drainage from the area and the dermabond lifting up.

## 2021-04-04 ENCOUNTER — Ambulatory Visit: Payer: Medicaid Other | Admitting: Family Medicine

## 2021-04-22 ENCOUNTER — Other Ambulatory Visit: Payer: Self-pay

## 2021-04-22 ENCOUNTER — Ambulatory Visit
Admission: EM | Admit: 2021-04-22 | Discharge: 2021-04-22 | Disposition: A | Discharge status: Home / Self Care | Payer: Medicaid Other | Attending: Sports Medicine | Admitting: Sports Medicine

## 2021-04-22 DIAGNOSIS — Z20822 Contact with and (suspected) exposure to covid-19: Secondary | ICD-10-CM | POA: Diagnosis present

## 2021-04-22 NOTE — Discharge Instructions (Signed)

## 2021-04-22 NOTE — ED Triage Notes (Signed)
Pt presents to [facility] for covid testing. No symptoms at this time. Pt needing test for work, no known exposure.

## 2021-04-23 LAB — SARS CORONAVIRUS 2 (TAT 6-24 HRS): SARS Coronavirus 2: NEGATIVE

## 2021-05-10 ENCOUNTER — Ambulatory Visit (INDEPENDENT_AMBULATORY_CARE_PROVIDER_SITE_OTHER): Payer: Medicaid Other

## 2021-05-10 ENCOUNTER — Other Ambulatory Visit: Payer: Self-pay

## 2021-05-10 DIAGNOSIS — Z3042 Encounter for surveillance of injectable contraceptive: Secondary | ICD-10-CM

## 2021-05-10 MED ORDER — MEDROXYPROGESTERONE ACETATE 150 MG/ML IM SUSP
150.0000 mg | Freq: Once | INTRAMUSCULAR | Status: AC
  Administered 2021-05-10: 150 mg via INTRAMUSCULAR

## 2021-05-10 NOTE — Progress Notes (Signed)
Patient presents today for Depo Provera injection within dates. Given IM LUOQ. Patient tolerated well. 

## 2021-05-17 NOTE — Progress Notes (Deleted)
Natale Milch, MD   No chief complaint on file.   HPI:      Carmen Black is a 20 y.o. G0P0000 whose LMP was No LMP recorded. Patient has had an injection., presents today for ***  Neg STDT testing 6/21; had BV on culture On depo for mnenrorhagia  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HCC)    Depression    Headache     Past Surgical History:  Procedure Laterality Date   NO PAST SURGERIES      Family History  Problem Relation Age of Onset   Diabetes Mother    Hypertension Mother    Bipolar disorder Mother    Deep vein thrombosis Mother    Migraines Mother    Seizures Mother    Cervical cancer Mother        30s   Colon cancer Father    Schizophrenia Paternal Aunt    Clotting disorder Paternal Aunt        blood clots in legs   Heart disease Maternal Grandmother    Hepatitis Maternal Grandmother    Clotting disorder Maternal Grandmother        blood clots in legs   Depression Half-Brother    Depression Half-Sister    Kidney disease Maternal Aunt    Kidney disease Paternal Uncle    Hepatitis Paternal Grandmother    Heart disease Paternal Grandmother     Social History   Socioeconomic History   Marital status: Single    Spouse name: Not on file   Number of children: Not on file   Years of education: Not on file   Highest education level: Not on file  Occupational History   Not on file  Tobacco Use   Smoking status: Never   Smokeless tobacco: Never  Vaping Use   Vaping Use: Some days   Substances: Nicotine, Flavoring  Substance and Sexual Activity   Alcohol use: Yes    Comment: social   Drug use: Yes    Frequency: 1.0 times per week    Types: Marijuana, MDMA (Ecstacy)   Sexual activity: Yes    Partners: Male, Female    Birth control/protection: Injection  Other Topics Concern   Not on file  Social History Narrative   Not on file    Social Determinants of Health   Financial Resource Strain: Not on file  Food Insecurity: Not on file  Transportation Needs: Not on file  Physical Activity: Not on file  Stress: Not on file  Social Connections: Not on file  Intimate Partner Violence: Not on file    Outpatient Medications Prior to Visit  Medication Sig Dispense Refill   medroxyPROGESTERone (DEPO-PROVERA) 150 MG/ML injection Inject 1 mL (150 mg total) into the muscle every 3 (three) months. 1 mL 2   ondansetron (ZOFRAN-ODT) 8 MG disintegrating tablet Take 8 mg by mouth every 8 (eight) hours as needed for nausea or vomiting.     triamcinolone (NASACORT) 55 MCG/ACT AERO nasal inhaler Place 2 sprays into the nose daily for 15 days. 1 each 1   No facility-administered medications prior to visit.      ROS:  Review of Systems BREAST: No symptoms   OBJECTIVE:   Vitals:  There were no vitals taken for this visit.  Physical Exam  Results: No results found for this or any previous visit (from the past  24 hour(s)).   Assessment/Plan: No diagnosis found.    No orders of the defined types were placed in this encounter.     No follow-ups on file.  Linzie Boursiquot B. Kauan Kloosterman, PA-C 05/17/2021 4:58 PM

## 2021-05-18 ENCOUNTER — Ambulatory Visit: Payer: Medicaid Other | Admitting: Obstetrics and Gynecology

## 2021-06-07 ENCOUNTER — Other Ambulatory Visit: Payer: Self-pay

## 2021-06-07 ENCOUNTER — Ambulatory Visit
Admission: RE | Admit: 2021-06-07 | Discharge: 2021-06-07 | Disposition: A | Discharge status: Home / Self Care | Payer: Medicaid Other | Source: Ambulatory Visit | Attending: Family Medicine | Admitting: Family Medicine

## 2021-06-07 VITALS — BP 118/78 | HR 83 | Temp 98.6°F | Resp 18 | Ht 62.0 in | Wt 111.0 lb

## 2021-06-07 DIAGNOSIS — H5789 Other specified disorders of eye and adnexa: Secondary | ICD-10-CM

## 2021-06-07 MED ORDER — KETOROLAC TROMETHAMINE 0.5 % OP SOLN: 1.0000 [drp] | Freq: Four times a day (QID) | OPHTHALMIC | 0 refills | Status: AC

## 2021-06-07 NOTE — ED Triage Notes (Signed)
Pt here with C/O eye read and irritated today. Was sent by work to make sure it isn't pink eye.

## 2021-06-07 NOTE — Discharge Instructions (Addendum)
Medication as prescribed. ° °Okay to return to work. ° °Take care ° °Dr. Deryk Bozman  °
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
## 2021-06-08 ENCOUNTER — Emergency Department
Admission: EM | Admit: 2021-06-08 | Discharge: 2021-06-08 | Disposition: A | Discharge status: Home / Self Care | Payer: Medicaid Other | Attending: Student in an Organized Health Care Education/Training Program | Admitting: Student in an Organized Health Care Education/Training Program

## 2021-06-08 ENCOUNTER — Other Ambulatory Visit: Payer: Self-pay

## 2021-06-08 DIAGNOSIS — X58XXXA Exposure to other specified factors, initial encounter: Secondary | ICD-10-CM | POA: Diagnosis not present

## 2021-06-08 DIAGNOSIS — S0501XA Injury of conjunctiva and corneal abrasion without foreign body, right eye, initial encounter: Secondary | ICD-10-CM | POA: Insufficient documentation

## 2021-06-08 DIAGNOSIS — S0591XA Unspecified injury of right eye and orbit, initial encounter: Secondary | ICD-10-CM | POA: Diagnosis present

## 2021-06-08 DIAGNOSIS — R519 Headache, unspecified: Secondary | ICD-10-CM | POA: Diagnosis not present

## 2021-06-08 MED ORDER — FLUORESCEIN SODIUM 1 MG OP STRP
ORAL_STRIP | OPHTHALMIC | Status: AC
  Administered 2021-06-08: 1 via OPHTHALMIC
  Filled 2021-06-08: qty 1

## 2021-06-08 MED ORDER — ERYTHROMYCIN 5 MG/GM OP OINT: 1.0000 "application " | TOPICAL_OINTMENT | Freq: Four times a day (QID) | OPHTHALMIC | 0 refills | Status: AC

## 2021-06-08 MED ORDER — ONDANSETRON 4 MG PO TBDP: 4.0000 mg | ORAL_TABLET | Freq: Three times a day (TID) | ORAL | 0 refills | Status: DC | PRN

## 2021-06-08 MED ORDER — ACETAMINOPHEN 500 MG PO TABS
1000.0000 mg | ORAL_TABLET | Freq: Once | ORAL | Status: AC
  Administered 2021-06-08: 1000 mg via ORAL
  Filled 2021-06-08: qty 2

## 2021-06-08 MED ORDER — FLUORESCEIN SODIUM 1 MG OP STRP: 1.0000 | ORAL_STRIP | Freq: Once | OPHTHALMIC | Status: AC

## 2021-06-08 MED ORDER — TETRACAINE HCL 0.5 % OP SOLN
2.0000 [drp] | Freq: Once | OPHTHALMIC | Status: AC
  Administered 2021-06-08: 2 [drp] via OPHTHALMIC
  Filled 2021-06-08: qty 4

## 2021-06-08 NOTE — ED Triage Notes (Signed)
Pt to ER via [facility] with complaints of right eye irritation, reports waking up with eye pain/ photosensitivity. Reports having difficulty opening her right eye/ pain increases with blinking. Today she is having difficulty seeing out of her right eye- reports there being a film or object in her vision.   Reports she was seen at urgent care yesterday and prescribed eye drops but they haven't relieved symptoms, reports eye drops burn when using them. States she now feels like symptoms have started in left eye. No drainage.
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
## 2021-07-27 ENCOUNTER — Other Ambulatory Visit: Payer: Self-pay

## 2021-07-27 ENCOUNTER — Encounter: Payer: Self-pay | Admitting: Obstetrics and Gynecology

## 2021-07-27 ENCOUNTER — Other Ambulatory Visit (HOSPITAL_COMMUNITY)
Admission: RE | Admit: 2021-07-27 | Discharge: 2021-07-27 | Disposition: A | Discharge status: Home / Self Care | Payer: Medicaid Other | Source: Ambulatory Visit | Attending: Obstetrics and Gynecology | Admitting: Obstetrics and Gynecology

## 2021-07-27 ENCOUNTER — Ambulatory Visit (INDEPENDENT_AMBULATORY_CARE_PROVIDER_SITE_OTHER): Payer: Medicaid Other | Admitting: Obstetrics and Gynecology

## 2021-07-27 VITALS — BP 110/70 | Ht 62.0 in | Wt 118.0 lb

## 2021-07-27 DIAGNOSIS — Z113 Encounter for screening for infections with a predominantly sexual mode of transmission: Secondary | ICD-10-CM | POA: Diagnosis present

## 2021-07-27 DIAGNOSIS — R112 Nausea with vomiting, unspecified: Secondary | ICD-10-CM

## 2021-07-27 DIAGNOSIS — R5383 Other fatigue: Secondary | ICD-10-CM

## 2021-07-27 DIAGNOSIS — Z124 Encounter for screening for malignant neoplasm of cervix: Secondary | ICD-10-CM | POA: Diagnosis present

## 2021-07-27 NOTE — Progress Notes (Signed)
Natale Milch, MD   Chief Complaint  Patient presents with   Contraception    Switch BC if possible due to feeling tired, vomitting, headaches x 2 weeks   STD testing    HPI:      [name] is a 20 y.o. G0P0000 whose LMP was No LMP recorded. Patient has had an injection., presents today for STD testing. Found out previous partner was unfaithful. No vag sx. Had odor a few wks ago with hx of BV but sx resolved. She is sex active, no pain/bleeding. Neg STD testing 03/21/21; due for pap now that she is 20.  Pt changed jobs and is now working 2nd shift. Has fatigue even though sleeping more, sometimes vomiting in AM because doesn't feel well, is extra hungry after work and eating junk food/fast food. Also having more frequent headaches. Sx for past 2 wks (coinciding with job change). Pt wonders if related to depo although has been on it since 5/22. Was also on it for 3 yrs in adolescence without side effects. Pt is amenorrheic, having cramping recently. Next depo due 08/02/21. Not sure if she wants to change depo. Did OCPs in past.   02/15/21 NOTE: menorrhagia. Pt has been off OCPs since 3/22 and hasn't had a period since; had neg UPT 02/07/21 and 3/22. Started bleeding a few days ago. First day was very heavy, saturating several pads in an hr, with small clots. Had LBP improved with NSAIDs. Bleeding lightened up after a day and is now dark brown spotting only. No pelvic pain. Neg gon/chlam 02/07/21, neg UPT 02/07/21. Pt would like to restart BC and do depo again. Had Rx for OCPs. Had irregular bleeding and severe dysmen with menses prior to OCPs. She is sex active, not using condoms.   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HCC)    Depression    Headache     Past Surgical History:  Procedure Laterality Date   NO PAST SURGERIES      Family History  Problem Relation Age of Onset    Diabetes Mother    Hypertension Mother    Bipolar disorder Mother    Deep vein thrombosis Mother    Migraines Mother    Seizures Mother    Cervical cancer Mother        30s   Colon cancer Father    Schizophrenia Paternal Aunt    Clotting disorder Paternal Aunt        blood clots in legs   Heart disease Maternal Grandmother    Hepatitis Maternal Grandmother    Clotting disorder Maternal Grandmother        blood clots in legs   Depression Half-Brother    Depression Half-Sister    Kidney disease Maternal Aunt    Kidney disease Paternal Uncle    Hepatitis Paternal Grandmother    Heart disease Paternal Grandmother     Social History   Socioeconomic History   Marital status: Single    Spouse name: Not on file   Number of children: Not on file   Years of education: Not on file   Highest education level: Not on file  Occupational History   Not on file  Tobacco Use   Smoking status: Never   Smokeless tobacco: Never  Vaping Use   Vaping Use: Some days   Substances: Nicotine, Flavoring  Substance and Sexual  Activity   Alcohol use: Yes    Comment: social   Drug use: Yes    Frequency: 1.0 times per week    Types: Marijuana, MDMA Chiropodist)   Sexual activity: Yes    Partners: Male, Female    Birth control/protection: Injection  Other Topics Concern   Not on file  Social History Narrative   Not on file   Social Determinants of Health   Financial Resource Strain: Not on file  Food Insecurity: Not on file  Transportation Needs: Not on file  Physical Activity: Not on file  Stress: Not on file  Social Connections: Not on file  Intimate Partner Violence: Not on file    Outpatient Medications Prior to Visit  Medication Sig Dispense Refill   ondansetron (ZOFRAN ODT) 4 MG disintegrating tablet Take 1 tablet (4 mg total) by mouth every 8 (eight) hours as needed for nausea or vomiting. 20 tablet 0   triamcinolone (NASACORT) 55 MCG/ACT AERO nasal inhaler Place 2 sprays  into the nose daily for 15 days. 1 each 1   No facility-administered medications prior to visit.      ROS:  Review of Systems  Constitutional:  Positive for fatigue. Negative for fever.  Gastrointestinal:  Positive for vomiting. Negative for blood in stool, constipation, diarrhea and nausea.  Genitourinary:  Negative for dyspareunia, dysuria, flank pain, frequency, hematuria, urgency, vaginal bleeding, vaginal discharge and vaginal pain.  Musculoskeletal:  Negative for back pain.  Skin:  Negative for rash.  Neurological:  Positive for headaches.  BREAST: No symptoms   OBJECTIVE:   Vitals:  BP 110/70   Ht 5\' 2"  (1.575 m)   Wt 118 lb (53.5 kg)   BMI 21.58 kg/m   Physical Exam Vitals reviewed.  Constitutional:      Appearance: She is well-developed.  Pulmonary:     Effort: Pulmonary effort is normal.  Genitourinary:    General: Normal vulva.     Pubic Area: No rash.      Labia:        Right: No rash, tenderness or lesion.        Left: No rash, tenderness or lesion.      Vagina: Normal. No vaginal discharge, erythema or tenderness.     Cervix: Normal.     Uterus: Normal. Not enlarged and not tender.      Adnexa: Right adnexa normal and left adnexa normal.       Right: No mass or tenderness.         Left: No mass or tenderness.    Musculoskeletal:        General: Normal range of motion.     Cervical back: Normal range of motion.  Skin:    General: Skin is warm and dry.  Neurological:     General: No focal deficit present.     Mental Status: She is alert and oriented to person, place, and time.  Psychiatric:        Mood and Affect: Mood normal.        Behavior: Behavior normal.        Thought Content: Thought content normal.        Judgment: Judgment normal.    Assessment/Plan: Screening for STD (sexually transmitted disease) - Plan: Cytology - PAP, HIV Antibody (routine testing w rflx), RPR, HSV 2 antibody, IgG, Hepatitis C antibody; full STD testing today.    Cervical cancer screening - Plan: Cytology - PAP  Other fatigue--most likely related to working 2nd shift  job now and sleep cycles off. Pt should also take covid test to rule that out. Discussed sleep cycles, eating healthier. Sx most likely not related to depo since hasn't had issues in past with it. Discussed changing BC vs getting 1 more shot and giving body time to adjust to job/shift change. Pt to f/u prn.   Nausea and vomiting, unspecified vomiting type--in AM. Improve diet at night (eating fast food after work). F/u prn.     Return if symptoms worsen or fail to improve.  Quinlan Mcfall B. Ytzel Gubler, PA-C 07/27/2021 11:42 AM

## 2021-07-28 LAB — HSV 2 ANTIBODY, IGG: HSV 2 IgG, Type Spec: <0.91 index (ref 0.00–0.90)

## 2021-07-28 LAB — HIV ANTIBODY (ROUTINE TESTING W REFLEX): HIV Screen 4th Generation wRfx: NONREACTIVE

## 2021-07-28 LAB — RPR: RPR Ser Ql: NONREACTIVE

## 2021-07-28 LAB — HEPATITIS C ANTIBODY: Hep C Virus Ab: <0.1 s/co ratio (ref 0.0–0.9)

## 2021-07-31 LAB — CYTOLOGY - PAP
Chlamydia: NEGATIVE
Comment: NEGATIVE
Comment: NEGATIVE
Comment: NORMAL
Diagnosis: NEGATIVE
Neisseria Gonorrhea: NEGATIVE
Trichomonas: NEGATIVE

## 2021-08-02 ENCOUNTER — Ambulatory Visit: Payer: Medicaid Other

## 2021-08-03 ENCOUNTER — Ambulatory Visit (INDEPENDENT_AMBULATORY_CARE_PROVIDER_SITE_OTHER): Payer: Medicaid Other

## 2021-08-03 ENCOUNTER — Other Ambulatory Visit: Payer: Self-pay

## 2021-08-03 DIAGNOSIS — Z3042 Encounter for surveillance of injectable contraceptive: Secondary | ICD-10-CM | POA: Diagnosis not present

## 2021-08-03 MED ORDER — MEDROXYPROGESTERONE ACETATE 150 MG/ML IM SUSP
150.0000 mg | Freq: Once | INTRAMUSCULAR | Status: AC
  Administered 2021-08-03: 150 mg via INTRAMUSCULAR

## 2021-08-03 NOTE — Progress Notes (Signed)
Pt here for depo which was given IM right glut.  Pt states it stings.  NDC# [phone]

## 2021-08-18 ENCOUNTER — Other Ambulatory Visit: Payer: Self-pay

## 2021-08-18 ENCOUNTER — Ambulatory Visit
Admission: EM | Admit: 2021-08-18 | Discharge: 2021-08-18 | Disposition: A | Discharge status: Home / Self Care | Payer: Medicaid Other

## 2021-08-18 DIAGNOSIS — H5711 Ocular pain, right eye: Secondary | ICD-10-CM | POA: Diagnosis not present

## 2021-08-18 NOTE — ED Triage Notes (Signed)
Pt here with C/O right eye pain when looking side up or down. Pt states that it is blurry.
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
## 2021-08-18 NOTE — Discharge Instructions (Signed)
Please go to [facility].  [place] to be evaluated by Dr. Druscilla Brownie the on-call ophthalmologist.[address] [place], [place] [address]

## 2021-09-15 ENCOUNTER — Ambulatory Visit
Admission: EM | Admit: 2021-09-15 | Discharge: 2021-09-15 | Disposition: A | Discharge status: Home / Self Care | Payer: Medicaid Other | Attending: Medical Oncology | Admitting: Medical Oncology

## 2021-09-15 ENCOUNTER — Other Ambulatory Visit: Payer: Self-pay

## 2021-09-15 ENCOUNTER — Telehealth: Payer: Self-pay | Admitting: Medical Oncology

## 2021-09-15 DIAGNOSIS — R519 Headache, unspecified: Secondary | ICD-10-CM | POA: Diagnosis not present

## 2021-09-15 DIAGNOSIS — R112 Nausea with vomiting, unspecified: Secondary | ICD-10-CM

## 2021-09-15 MED ORDER — KETOROLAC TROMETHAMINE 60 MG/2ML IM SOLN
60.0000 mg | Freq: Once | INTRAMUSCULAR | Status: AC
Start: 2021-09-15 — End: 2021-09-15
  Administered 2021-09-15: 60 mg via INTRAMUSCULAR

## 2021-09-15 MED ORDER — METHYLPREDNISOLONE SODIUM SUCC 125 MG IJ SOLR
125.0000 mg | Freq: Once | INTRAMUSCULAR | Status: AC
  Administered 2021-09-15: 125 mg via INTRAMUSCULAR

## 2021-09-15 MED ORDER — ONDANSETRON 8 MG PO TBDP
8.0000 mg | ORAL_TABLET | Freq: Once | ORAL | Status: AC
Start: 2021-09-15 — End: 2021-09-15
  Administered 2021-09-15: 8 mg via ORAL

## 2021-09-15 NOTE — Telephone Encounter (Signed)
error 

## 2021-09-15 NOTE — Discharge Instructions (Addendum)
You were given Toradol, Solu-medrol and zofran
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
## 2021-09-15 NOTE — ED Triage Notes (Signed)
Pt here with C/O headache on right side behind right eye ball for 3 days.

## 2021-09-16 ENCOUNTER — Emergency Department
Admission: EM | Admit: 2021-09-16 | Discharge: 2021-09-18 | Disposition: A | Discharge status: Home / Self Care | Payer: Medicaid Other | Attending: Emergency Medicine | Admitting: Emergency Medicine

## 2021-09-16 ENCOUNTER — Other Ambulatory Visit: Payer: Self-pay

## 2021-09-16 ENCOUNTER — Ambulatory Visit: Payer: Medicaid Other

## 2021-09-16 ENCOUNTER — Encounter: Payer: Self-pay | Admitting: Emergency Medicine

## 2021-09-16 DIAGNOSIS — J069 Acute upper respiratory infection, unspecified: Secondary | ICD-10-CM | POA: Diagnosis not present

## 2021-09-16 DIAGNOSIS — R0981 Nasal congestion: Secondary | ICD-10-CM | POA: Diagnosis present

## 2021-09-16 DIAGNOSIS — Z20822 Contact with and (suspected) exposure to covid-19: Secondary | ICD-10-CM | POA: Insufficient documentation

## 2021-09-16 LAB — RESP PANEL BY RT-PCR (FLU A&B, COVID) ARPGX2
Influenza A by PCR: NEGATIVE
Influenza B by PCR: NEGATIVE
SARS Coronavirus 2 by RT PCR: NEGATIVE

## 2021-09-16 LAB — GROUP A STREP BY PCR: Group A Strep by PCR: NOT DETECTED

## 2021-09-16 NOTE — ED Triage Notes (Signed)
Pt reports her boyfriend tested positive for flu and she wants to be tested
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
## 2021-09-17 ENCOUNTER — Ambulatory Visit: Payer: Self-pay
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
## 2021-10-19 ENCOUNTER — Ambulatory Visit: Payer: 59 | Admitting: Obstetrics and Gynecology

## 2021-10-24 ENCOUNTER — Encounter: Payer: Self-pay | Admitting: Emergency Medicine

## 2021-10-24 ENCOUNTER — Ambulatory Visit
Admission: EM | Admit: 2021-10-24 | Discharge: 2021-10-24 | Disposition: A | Discharge status: Home / Self Care | Payer: 59 | Attending: Emergency Medicine | Admitting: Emergency Medicine

## 2021-10-24 DIAGNOSIS — B349 Viral infection, unspecified: Secondary | ICD-10-CM

## 2021-10-24 LAB — POCT RAPID STREP A (OFFICE): Rapid Strep A Screen: NEGATIVE

## 2021-10-24 MED ORDER — BENZONATATE 100 MG PO CAPS: 100.0000 mg | ORAL_CAPSULE | Freq: Three times a day (TID) | ORAL | 0 refills | Status: DC | PRN
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
## 2021-10-24 NOTE — ED Triage Notes (Signed)
Pt c/o ST, cough, and bilateral ear pain.

## 2021-10-24 NOTE — Discharge Instructions (Addendum)
Your strep test is negative.  Your COVID and Flu tests are pending.  You should self quarantine until the test results are back.    Take Tylenol or ibuprofen as needed for fever or discomfort.  Rest and keep yourself hydrated.    Follow-up with your primary care provider if your symptoms are not improving.     

## 2021-10-25 ENCOUNTER — Other Ambulatory Visit: Payer: Self-pay

## 2021-10-25 ENCOUNTER — Ambulatory Visit: Payer: Medicaid Other | Admitting: Family Medicine

## 2021-10-25 ENCOUNTER — Encounter: Payer: Self-pay | Admitting: Family Medicine

## 2021-10-25 DIAGNOSIS — Z113 Encounter for screening for infections with a predominantly sexual mode of transmission: Secondary | ICD-10-CM

## 2021-10-25 DIAGNOSIS — B9689 Other specified bacterial agents as the cause of diseases classified elsewhere: Secondary | ICD-10-CM

## 2021-10-25 LAB — WET PREP FOR TRICH, YEAST, CLUE
Trichomonas Exam: NEGATIVE
Yeast Exam: NEGATIVE

## 2021-10-25 MED ORDER — METRONIDAZOLE 500 MG PO TABS: 500.0000 mg | ORAL_TABLET | Freq: Two times a day (BID) | ORAL | 0 refills | Status: AC

## 2021-10-25 NOTE — Progress Notes (Signed)
Pt here for STD screening.  Wet mount results reviewed and medication dispensed per Provider orders.  Pt declined condoms. Lilyan Prete M Audrick Lamoureaux, RN  
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
## 2021-10-27 LAB — COVID-19, FLU A+B NAA
Influenza A, NAA: NOT DETECTED
Influenza B, NAA: NOT DETECTED
SARS-CoV-2, NAA: NOT DETECTED

## 2021-11-07 ENCOUNTER — Ambulatory Visit
Admission: EM | Admit: 2021-11-07 | Discharge: 2021-11-07 | Disposition: A | Discharge status: Home / Self Care | Payer: 59 | Attending: Internal Medicine | Admitting: Internal Medicine

## 2021-11-07 ENCOUNTER — Ambulatory Visit: Payer: Self-pay

## 2021-11-07 ENCOUNTER — Ambulatory Visit (INDEPENDENT_AMBULATORY_CARE_PROVIDER_SITE_OTHER): Payer: 59

## 2021-11-07 ENCOUNTER — Other Ambulatory Visit: Payer: Self-pay

## 2021-11-07 DIAGNOSIS — S8001XA Contusion of right knee, initial encounter: Secondary | ICD-10-CM

## 2021-11-07 DIAGNOSIS — M25561 Pain in right knee: Secondary | ICD-10-CM | POA: Diagnosis not present

## 2021-11-07 NOTE — ED Triage Notes (Signed)
Patient presents to Urgent Care with complaints of right knee injury she states she hit her knee against a metal rod yesterday. Increased pain with standing or movement. States she has a hx of knee trauma to the right knee.
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
## 2021-11-07 NOTE — Discharge Instructions (Signed)
Symptoms and exam today are consistent with a bruise to the front of the right knee.  Xray was reassuring and normal--no fractures or derangements seen.  Use ice 10-15 minutes several times daily to help with swelling and pain.  Take ibuprofen 2 tablets 3 times daily as needed for the next several days to help with pain.  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  Anticipate gradual improvement in pain and movement over the next 7-10 days.

## 2021-11-13 ENCOUNTER — Ambulatory Visit: Payer: Self-pay

## 2021-12-11 ENCOUNTER — Ambulatory Visit: Payer: Medicaid Other | Admitting: Obstetrics and Gynecology
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
## 2021-12-13 ENCOUNTER — Ambulatory Visit: Payer: Medicaid Other | Admitting: Advanced Practice Midwife

## 2022-01-08 ENCOUNTER — Other Ambulatory Visit: Payer: Self-pay

## 2022-01-08 ENCOUNTER — Ambulatory Visit
Admission: EM | Admit: 2022-01-08 | Discharge: 2022-01-08 | Disposition: A | Discharge status: Home / Self Care | Payer: Medicaid Other | Attending: Physician Assistant | Admitting: Physician Assistant

## 2022-01-08 DIAGNOSIS — Z20822 Contact with and (suspected) exposure to covid-19: Secondary | ICD-10-CM | POA: Insufficient documentation

## 2022-01-08 DIAGNOSIS — J029 Acute pharyngitis, unspecified: Secondary | ICD-10-CM | POA: Insufficient documentation

## 2022-01-08 LAB — RESP PANEL BY RT-PCR (FLU A&B, COVID) ARPGX2
Influenza A by PCR: NEGATIVE
Influenza B by PCR: NEGATIVE
SARS Coronavirus 2 by RT PCR: NEGATIVE

## 2022-01-08 LAB — GROUP A STREP BY PCR: Group A Strep by PCR: NOT DETECTED

## 2022-01-08 MED ORDER — AZITHROMYCIN 500 MG PO TABS: 500.0000 mg | ORAL_TABLET | Freq: Every day | ORAL | 0 refills | Status: DC
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
## 2022-01-08 NOTE — ED Triage Notes (Signed)
Pt c/o sore throat, right ear pain, and vomiting x1day ? ?Pt states that she woke up with throat swelling and was having issues breathing. Pt drunk some herbal tea and began to vomit. ? ?Pt states that she vomited in the lobby and she feels better.  ? ?Pt states that the only discomfort she currently feels is her throat. Pt described her vomit as "yellow and stringy" and has a photo of the vomit.  ? ?Pt asks for a covid test.  ?

## 2022-01-08 NOTE — Discharge Instructions (Signed)
I will call you  or send you a Mychart message when your strep test is back ?

## 2022-01-17 ENCOUNTER — Ambulatory Visit
Admission: EM | Admit: 2022-01-17 | Discharge: 2022-01-17 | Disposition: A | Discharge status: Home / Self Care | Payer: Medicaid Other | Attending: Emergency Medicine | Admitting: Emergency Medicine

## 2022-01-17 DIAGNOSIS — H6503 Acute serous otitis media, bilateral: Secondary | ICD-10-CM | POA: Diagnosis present

## 2022-01-17 DIAGNOSIS — J0100 Acute maxillary sinusitis, unspecified: Secondary | ICD-10-CM | POA: Insufficient documentation

## 2022-01-17 LAB — MONONUCLEOSIS SCREEN: Mono Screen: NEGATIVE

## 2022-01-17 LAB — GROUP A STREP BY PCR: Group A Strep by PCR: NOT DETECTED

## 2022-01-17 MED ORDER — AMOXICILLIN-POT CLAVULANATE 875-125 MG PO TABS: 1.0000 | ORAL_TABLET | Freq: Two times a day (BID) | ORAL | 0 refills | Status: DC

## 2022-01-17 MED ORDER — FLUTICASONE PROPIONATE 50 MCG/ACT NA SUSP: 2.0000 | Freq: Every day | NASAL | 0 refills | Status: DC

## 2022-01-17 MED ORDER — PROMETHAZINE-DM 6.25-15 MG/5ML PO SYRP: 5.0000 mL | ORAL_SOLUTION | Freq: Four times a day (QID) | ORAL | 0 refills | Status: DC | PRN

## 2022-01-17 NOTE — ED Provider Notes (Signed)
?HPI ? ?SUBJECTIVE: ? ?Patient reports bilateral ear pain worse on the right, decreased hearing, cough productive of grayish phlegm, nasal congestion, clear rhinorrhea, postnasal drip, sinus pain and pressure for the past 4 to 5 days.  She reports having a sore throat, swollen cervical lymph nodes, pain with swallowing since 4/9. Patient was seen here on 4/10 with right ear pain and sore throat.  Flu, COVID, strep PCR negative, but was placed on azithromycin due to physical exam findings of intensely erythematous oropharynx states that she finished the azithromycin without any improvement in her symptoms.  No known mono exposure.  No wheezing, shortness of breath, neck stiffness, drooling, trismus, muffled voice, rash, abdominal pain.  She states that she had difficulty breathing at the beginning of the illness, but this has since resolved.  She reports allergy symptoms of sneezing, watery eyes, but has tried Claritin without any improvement in her symptoms.  She denies GERD symptoms.  No antipyretic in the past 6 hours.  She tried Mucinex sinus, cough drops, rest and increasing fluids.  The Mucinex sinus helps.  Symptoms are worse in the morning.  She has a past medical history of GERD, on medication and frequent sinusitis.  No history of allergies.  LMP: She is amenorrheic.  Denies possibility being pregnant.  PCP: None. ? ? ?Past Medical History:  ?Diagnosis Date  ? ADHD (attention deficit hyperactivity disorder)   ? Anxiety   ? Compulsive behavior disorder (HCC)   ? Depression   ? Headache   ? ? ?Past Surgical History:  ?Procedure Laterality Date  ? NO PAST SURGERIES    ? ? ?Family History  ?Problem Relation Age of Onset  ? Diabetes Mother   ? Hypertension Mother   ? Bipolar disorder Mother   ? Deep vein thrombosis Mother   ? Migraines Mother   ? Seizures Mother   ? Cervical cancer Mother   ?     30s  ? Colon cancer Father   ? Schizophrenia Paternal Aunt   ? Clotting disorder Paternal Aunt   ?     blood clots  in legs  ? Heart disease Maternal Grandmother   ? Hepatitis Maternal Grandmother   ? Clotting disorder Maternal Grandmother   ?     blood clots in legs  ? Depression Half-Brother   ? Depression Half-Sister   ? Kidney disease Maternal Aunt   ? Kidney disease Paternal Uncle   ? Hepatitis Paternal Grandmother   ? Heart disease Paternal Grandmother   ? ? ?Social History  ? ?Tobacco Use  ? Smoking status: Some Days  ?  Types: Cigars  ? Smokeless tobacco: Never  ?Vaping Use  ? Vaping Use: Former  ? Substances: Nicotine, Flavoring  ?Substance Use Topics  ? Alcohol use: Yes  ?  Comment: social  ? Drug use: Yes  ?  Frequency: 1.0 times per week  ?  Types: Marijuana  ?  Comment: last used new years 2023  ? ? ?No current facility-administered medications for this encounter. ? ?Current Outpatient Medications:  ?  amoxicillin-clavulanate (AUGMENTIN) 875-125 MG tablet, Take 1 tablet by mouth 2 (two) times daily. X 7 days, Disp: 14 tablet, Rfl: 0 ?  fluticasone (FLONASE) 50 MCG/ACT nasal spray, Place 2 sprays into both nostrils daily., Disp: 16 g, Rfl: 0 ?  guaiFENesin (MUCINEX PO), Take by mouth., Disp: , Rfl:  ?  promethazine-dextromethorphan (PROMETHAZINE-DM) 6.25-15 MG/5ML syrup, Take 5 mLs by mouth 4 (four) times daily as needed for cough.,  Disp: 118 mL, Rfl: 0 ? ?No Known Allergies ? ? ?ROS ? ?As noted in HPI.  ? ?Physical Exam ? ?BP (!) 122/92 (BP Location: Left Arm)   Pulse 83   Temp 98.7 ?F (37.1 ?C) (Oral)   Resp 18   Ht 5\' 2"  (1.575 m)   Wt 59 kg   LMP  (LMP Unknown)   SpO2 100%   BMI 23.78 kg/m?  ? ?Constitutional: Well developed, well nourished, no acute distress ?Eyes:  EOMI, conjunctiva normal bilaterally ?HENT: Normocephalic, atraumatic,mucus membranes moist.  Bilateral serous effusion behind both TMs.  Decreased hearing right ear compared to left.  Positive nasal congestion.  Normal turbinates.  Positive maxillary sinus tenderness.  No frontal sinus tenderness.  Erythematous oropharynx, tonsils normal size  without exudates.  Uvula midline.  No obvious postnasal drip.   ?Respiratory: Normal inspiratory effort, lungs clear bilaterally ?Cardiovascular: Normal rate, no murmurs, rubs, gallops ?GI: nondistended, nontender. No appreciable splenomegaly ?skin: No rash, skin intact ?Lymph: Positive posterior cervical lymphadenopathy.  No anterior cervical lymphadenopathy ?Musculoskeletal: no deformities ?Neurologic: Alert & oriented x 3, no focal neuro deficits ?Psychiatric: Speech and behavior appropriate. ? ?ED Course ? ? ?Medications - No data to display ? ?Orders Placed This Encounter  ?Procedures  ? Group A Strep by PCR  ?  Standing Status:   Standing  ?  Number of Occurrences:   1  ?  Order Specific Question:   Patient immune status  ?  Answer:   Normal  ?  Order Specific Question:   Release to patient  ?  Answer:   Immediate  ? Mononucleosis screen  ?  Standing Status:   Standing  ?  Number of Occurrences:   1  ? ? ?Results for orders placed or performed during the hospital encounter of 01/17/22 (from the past 24 hour(s))  ?Group A Strep by PCR     Status: None  ? Collection Time: 01/17/22 12:49 PM  ? Specimen: Throat; Sterile Swab  ?Result Value Ref Range  ? Group A Strep by PCR NOT DETECTED NOT DETECTED  ?Mononucleosis screen     Status: None  ? Collection Time: 01/17/22  2:52 PM  ?Result Value Ref Range  ? Mono Screen NEGATIVE NEGATIVE  ? ?No results found. ? ?ED Clinical Impression ? ?1. Acute non-recurrent maxillary sinusitis   ?2. Non-recurrent acute serous otitis media of both ears   ? ? ? ?ED Assessment/Plan ? ?Previous records reviewed.  As noted in HPI. ? ?Strep PCR negative.  Will check mono to rule this out as a cause of her symptoms.  However, she states the sore throat is getting better, her primary complaint today is the ear pain and sinus pain/pressure.  She has a bilateral serous otitis media, and concern for a maxillary sinusitis.  As her lungs are clear, doubt pneumonia.  Suspect postnasal drip causing  her cough.  Sending home on Augmentin, Flonase, saline nasal irrigation, Promethazine DM, continue the Mucinex D for nasal congestion.  Follow-up with her OB/GYN or primary care provider of her choice.  Will provide primary care list for ongoing care. ? ?Patient has MyChart.  Phone number [phone] ? ?Mono negative. ? ?Discussed labs,  MDM, plan and followup with patient.  patient agrees with plan.  ? ?Meds ordered this encounter  ?Medications  ? amoxicillin-clavulanate (AUGMENTIN) 875-125 MG tablet  ?  Sig: Take 1 tablet by mouth 2 (two) times daily. X 7 days  ?  Dispense:  14 tablet  ?  Refill:  0  ? fluticasone (FLONASE) 50 MCG/ACT nasal spray  ?  Sig: Place 2 sprays into both nostrils daily.  ?  Dispense:  16 g  ?  Refill:  0  ? promethazine-dextromethorphan (PROMETHAZINE-DM) 6.25-15 MG/5ML syrup  ?  Sig: Take 5 mLs by mouth 4 (four) times daily as needed for cough.  ?  Dispense:  118 mL  ?  Refill:  0  ? ? ? ?*This clinic note was created using [employer]. Therefore, there may be occasional mistakes despite careful proofreading. ? ? ?  ?Melynda Ripple, MD ?01/17/22 1559 ? ?
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
## 2022-01-17 NOTE — ED Triage Notes (Signed)
Patient is here for Sore throat "originally started the day after easter, all tests negative but did 5 days of oral abs". Glands are still swelling with Ear pain. No rash. No fever.  ?

## 2022-01-24 ENCOUNTER — Ambulatory Visit: Payer: Medicaid Other | Admitting: Family Medicine

## 2022-01-24 ENCOUNTER — Ambulatory Visit: Payer: Medicaid Other

## 2022-01-24 DIAGNOSIS — Z5321 Procedure and treatment not carried out due to patient leaving prior to being seen by health care provider: Secondary | ICD-10-CM

## 2022-01-24 NOTE — Progress Notes (Signed)
Pt left without being seen by Provider.  Windle Guard, RN ? ?

## 2022-01-26 ENCOUNTER — Ambulatory Visit (LOCAL_COMMUNITY_HEALTH_CENTER): Payer: Self-pay

## 2022-01-26 ENCOUNTER — Encounter: Payer: Self-pay | Admitting: Licensed Practical Nurse

## 2022-01-26 ENCOUNTER — Ambulatory Visit (INDEPENDENT_AMBULATORY_CARE_PROVIDER_SITE_OTHER): Payer: Medicaid Other | Admitting: Licensed Practical Nurse

## 2022-01-26 ENCOUNTER — Other Ambulatory Visit (HOSPITAL_COMMUNITY)
Admission: RE | Admit: 2022-01-26 | Discharge: 2022-01-26 | Disposition: A | Discharge status: Home / Self Care | Payer: Medicaid Other | Source: Ambulatory Visit | Attending: Licensed Practical Nurse | Admitting: Licensed Practical Nurse

## 2022-01-26 VITALS — BP 118/62 | Ht 62.0 in | Wt 134.0 lb

## 2022-01-26 DIAGNOSIS — Z124 Encounter for screening for malignant neoplasm of cervix: Secondary | ICD-10-CM | POA: Diagnosis present

## 2022-01-26 DIAGNOSIS — Z113 Encounter for screening for infections with a predominantly sexual mode of transmission: Secondary | ICD-10-CM

## 2022-01-26 DIAGNOSIS — Z01419 Encounter for gynecological examination (general) (routine) without abnormal findings: Secondary | ICD-10-CM

## 2022-01-26 DIAGNOSIS — N898 Other specified noninflammatory disorders of vagina: Secondary | ICD-10-CM

## 2022-01-26 DIAGNOSIS — F419 Anxiety disorder, unspecified: Secondary | ICD-10-CM

## 2022-01-26 DIAGNOSIS — Z111 Encounter for screening for respiratory tuberculosis: Secondary | ICD-10-CM

## 2022-01-26 MED ORDER — SERTRALINE HCL 25 MG PO TABS: ORAL_TABLET | ORAL | 0 refills | Status: DC
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
## 2022-01-29 ENCOUNTER — Ambulatory Visit (LOCAL_COMMUNITY_HEALTH_CENTER): Payer: Self-pay

## 2022-01-29 DIAGNOSIS — Z111 Encounter for screening for respiratory tuberculosis: Secondary | ICD-10-CM

## 2022-01-29 LAB — CERVICOVAGINAL ANCILLARY ONLY
Bacterial Vaginitis (gardnerella): NEGATIVE
Candida Glabrata: NEGATIVE
Candida Vaginitis: POSITIVE — AB
Comment: NEGATIVE
Comment: NEGATIVE
Comment: NEGATIVE

## 2022-01-29 LAB — TB SKIN TEST
Induration: 0 mm
TB Skin Test: NEGATIVE

## 2022-01-29 NOTE — Progress Notes (Signed)
Presents for PPD reading.  Omm reaction.  Tonny Branch, RN  ?

## 2022-01-30 ENCOUNTER — Other Ambulatory Visit: Payer: Medicaid Other

## 2022-01-30 LAB — CYTOLOGY - PAP
Chlamydia: NEGATIVE
Comment: NEGATIVE
Comment: NEGATIVE
Comment: NORMAL
Diagnosis: NEGATIVE
Neisseria Gonorrhea: NEGATIVE
Trichomonas: NEGATIVE

## 2022-01-31 ENCOUNTER — Ambulatory Visit: Payer: Medicaid Other

## 2022-02-07 ENCOUNTER — Encounter: Payer: Self-pay | Admitting: Licensed Practical Nurse

## 2022-03-02 ENCOUNTER — Other Ambulatory Visit: Payer: Self-pay | Admitting: Licensed Practical Nurse

## 2022-03-02 DIAGNOSIS — F419 Anxiety disorder, unspecified: Secondary | ICD-10-CM

## 2022-03-02 DIAGNOSIS — Z01419 Encounter for gynecological examination (general) (routine) without abnormal findings: Secondary | ICD-10-CM

## 2022-03-03 ENCOUNTER — Other Ambulatory Visit: Payer: Self-pay | Admitting: Licensed Practical Nurse

## 2022-03-03 DIAGNOSIS — Z01419 Encounter for gynecological examination (general) (routine) without abnormal findings: Secondary | ICD-10-CM

## 2022-03-03 DIAGNOSIS — F419 Anxiety disorder, unspecified: Secondary | ICD-10-CM

## 2022-03-05 ENCOUNTER — Encounter: Payer: Self-pay | Admitting: Licensed Practical Nurse

## 2022-03-05 ENCOUNTER — Other Ambulatory Visit: Payer: Self-pay | Admitting: Licensed Practical Nurse

## 2022-03-05 ENCOUNTER — Telehealth: Payer: Self-pay

## 2022-03-05 DIAGNOSIS — F320 Major depressive disorder, single episode, mild: Secondary | ICD-10-CM

## 2022-03-05 MED ORDER — SERTRALINE HCL 50 MG PO TABS: 50.0000 mg | ORAL_TABLET | Freq: Every day | ORAL | 3 refills | Status: DC

## 2022-03-05 NOTE — Telephone Encounter (Signed)
Can you send in a refill for her Zoloft. Pt states its working fine but her f/up is 6/9.and she's out

## 2022-03-05 NOTE — Telephone Encounter (Signed)
Pt aware.

## 2022-03-08 ENCOUNTER — Telehealth: Payer: Self-pay

## 2022-03-08 NOTE — Telephone Encounter (Signed)
Pt calling to see if she needed to keep her appt tomorrow b/c she spoke c a nurse and has her refill.  [phone]  Adv pt she does need to keep appt.  She states 'no problem'.

## 2022-03-09 ENCOUNTER — Ambulatory Visit (INDEPENDENT_AMBULATORY_CARE_PROVIDER_SITE_OTHER): Payer: Medicaid Other | Admitting: Licensed Practical Nurse

## 2022-03-09 ENCOUNTER — Encounter: Payer: Self-pay | Admitting: Licensed Practical Nurse

## 2022-03-09 VITALS — BP 108/70 | Wt 130.2 lb

## 2022-03-09 DIAGNOSIS — Z79899 Other long term (current) drug therapy: Secondary | ICD-10-CM

## 2022-03-09 NOTE — Progress Notes (Signed)
Obstetrics & Gynecology Office Visit   Chief Complaint:  Chief Complaint  Patient presents with   Follow-up    Patient returns back to clinic for follow up for anxiety/depression. Patient reports good compliance and  on Zoloft. Patient states that dose was increased to 50mg  and states she has had good symptom control.  Patient reports episodes of crying but states has been improving since increasing medication and reports that she is not as easily angered as before.     History of Present Illness: Here for mood check. Med follow up Was started on Zoloft at the end of April, now on 50mg  daily. Feel this does is helping.  Reports she is less angry, things that used to bother, no longer do.  She does cry more often, but is this is a good thing as she used to never "show her emotions".  Currently working 2 jobs and in school therefore has little time for exercise.    Review of Systems: denies   Past Medical History:  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HCC)    Depression    Headache     Past Surgical History:  Past Surgical History:  Procedure Laterality Date   NO PAST SURGERIES      Gynecologic History: No LMP recorded (within months). (Menstrual status: Irregular Periods).  Obstetric History: G0P0000  Family History:  Family History  Problem Relation Age of Onset   Diabetes Mother    Hypertension Mother    Bipolar disorder Mother    Deep vein thrombosis Mother    Migraines Mother    Seizures Mother    Cervical cancer Mother        30s   Colon cancer Father    Schizophrenia Paternal Aunt    Clotting disorder Paternal Aunt        blood clots in legs   Heart disease Maternal Grandmother    Hepatitis Maternal Grandmother    Clotting disorder Maternal Grandmother        blood clots in legs   Depression Half-Brother    Depression Half-Sister    Kidney disease  Maternal Aunt    Kidney disease Paternal Uncle    Hepatitis Paternal Grandmother    Heart disease Paternal Grandmother     Social History:  Social History   Socioeconomic History   Marital status: Single    Spouse name: Not on file   Number of children: Not on file   Years of education: Not on file   Highest education level: Not on file  Occupational History   Not on file  Tobacco Use   Smoking status: Some Days    Types: Cigars   Smokeless tobacco: Never  Vaping Use   Vaping Use: Former   Substances: Nicotine, Flavoring  Substance and Sexual Activity   Alcohol use: Yes    Comment: social   Drug use: Yes    Frequency: 1.0 times per week    Types: Marijuana    Comment: last used new years 2023   Sexual activity: Yes    Partners: Male, Female    Birth control/protection: None  Other Topics Concern   Not on file  Social History Narrative   Not on file   Social Determinants of Health   Financial Resource Strain: Not on file  Food Insecurity: Not on file  Transportation Needs: Not on file  Physical  Activity: Not on file  Stress: Not on file  Social Connections: Not on file  Intimate Partner Violence: Not At Risk (10/25/2021)   Humiliation, Afraid, Rape, and Kick questionnaire    Fear of Current or Ex-Partner: No    Emotionally Abused: No    Physically Abused: No    Sexually Abused: No    Allergies:  No Known Allergies  Medications: Prior to Admission medications   Medication Sig Start Date End Date Taking? Authorizing Provider  amoxicillin-clavulanate (AUGMENTIN) 875-125 MG tablet Take 1 tablet by mouth 2 (two) times daily. X 7 days 01/17/22  Yes Domenick Gong, MD  sertraline (ZOLOFT) 50 MG tablet Take 1 tablet (50 mg total) by mouth daily. 03/05/22  Yes Brentley Horrell, Courtney Heys, CNM  fluticasone (FLONASE) 50 MCG/ACT nasal spray Place 2 sprays into both nostrils daily. Patient not taking: Reported on 03/09/2022 01/17/22   Domenick Gong, MD  guaiFENesin  (MUCINEX PO) Take by mouth. Patient not taking: Reported on 03/09/2022    [provider]  promethazine-dextromethorphan (PROMETHAZINE-DM) 6.25-15 MG/5ML syrup Take 5 mLs by mouth 4 (four) times daily as needed for cough. Patient not taking: Reported on 03/09/2022 01/17/22   Domenick Gong, MD  sertraline (ZOLOFT) 25 MG tablet Take 1 tablet (25 mg total) by mouth daily for 7 days, THEN 2 tablets (50 mg total) daily. 01/26/22 03/04/22  Eusebio Blazejewski, Courtney Heys, CNM  norelgestromin-ethinyl estradiol [facility]) 150-35 MCG/24HR transdermal patch Place 1 patch onto the skin once a week. Leave patch off on week 4 for 1 week Patient not taking: Reported on 11/12/2019 06/03/19 11/29/19  Vena Austria, MD    Physical Exam Vitals:  Vitals:   03/09/22 0910  BP: 108/70   No LMP recorded (within months). (Menstrual status: Irregular Periods).  General: NAD HEENT: normocephalic, anicteric Pulmonary: No increased work of breathing Neurologic: Grossly intact Psychiatric: mood appropriate, affect full  PCH-9 2   Assessment: 21 y.o. G0P0000 her follow up related to Depression medication   Plan: Problem List Items Addressed This Visit   None Will continue Zoloft 50mg , follow in 3 to 6 months.  , CNM  Carie Caddy, [facility]  03/09/22  9:29 AM

## 2022-03-13 ENCOUNTER — Ambulatory Visit
Admission: EM | Admit: 2022-03-13 | Discharge: 2022-03-13 | Disposition: A | Discharge status: Home / Self Care | Payer: Medicaid Other | Attending: Nurse Practitioner | Admitting: Nurse Practitioner

## 2022-03-13 DIAGNOSIS — M549 Dorsalgia, unspecified: Secondary | ICD-10-CM | POA: Diagnosis not present

## 2022-03-13 DIAGNOSIS — R29898 Other symptoms and signs involving the musculoskeletal system: Secondary | ICD-10-CM

## 2022-03-13 LAB — COMPREHENSIVE METABOLIC PANEL
ALT: 17 U/L (ref 0–44)
AST: 23 U/L (ref 15–41)
Albumin: 4.7 g/dL (ref 3.5–5.0)
Alkaline Phosphatase: 48 U/L (ref 38–126)
Anion gap: 6 (ref 5–15)
BUN: 13 mg/dL (ref 6–20)
CO2: 26 mmol/L (ref 22–32)
Calcium: 9.2 mg/dL (ref 8.9–10.3)
Chloride: 103 mmol/L (ref 98–111)
Creatinine, Ser: 0.74 mg/dL (ref 0.44–1.00)
GFR, Estimated: >60 mL/min (ref >60)
Glucose, Bld: 91 mg/dL (ref 70–99)
Potassium: 4 mmol/L (ref 3.5–5.1)
Sodium: 135 mmol/L (ref 135–145)
Total Bilirubin: 0.6 mg/dL (ref 0.3–1.2)
Total Protein: 7.6 g/dL (ref 6.5–8.1)

## 2022-03-13 LAB — URINALYSIS, ROUTINE W REFLEX MICROSCOPIC
Bilirubin Urine: NEGATIVE
Glucose, UA: NEGATIVE mg/dL
Hgb urine dipstick: NEGATIVE
Ketones, ur: NEGATIVE mg/dL
Leukocytes,Ua: NEGATIVE
Nitrite: NEGATIVE
Protein, ur: NEGATIVE mg/dL
Specific Gravity, Urine: 1.02 (ref 1.005–1.030)
pH: 6 (ref 5.0–8.0)

## 2022-03-13 LAB — CBC WITH DIFFERENTIAL/PLATELET
Abs Immature Granulocytes: 0.01 10*3/uL (ref 0.00–0.07)
Basophils Absolute: 0.1 10*3/uL (ref 0.0–0.1)
Basophils Relative: 1 %
Eosinophils Absolute: 0.1 10*3/uL (ref 0.0–0.5)
Eosinophils Relative: 2 %
HCT: 41.1 % (ref 36.0–46.0)
Hemoglobin: 14.2 g/dL (ref 12.0–15.0)
Immature Granulocytes: 0 %
Lymphocytes Relative: 46 %
Lymphs Abs: 2.7 10*3/uL (ref 0.7–4.0)
MCH: 31.7 pg (ref 26.0–34.0)
MCHC: 34.5 g/dL (ref 30.0–36.0)
MCV: 91.7 fL (ref 80.0–100.0)
Monocytes Absolute: 0.8 10*3/uL (ref 0.1–1.0)
Monocytes Relative: 13 %
Neutro Abs: 2.3 10*3/uL (ref 1.7–7.7)
Neutrophils Relative %: 38 %
Platelets: 261 10*3/uL (ref 150–400)
RBC: 4.48 MIL/uL (ref 3.87–5.11)
RDW: 12.1 % (ref 11.5–15.5)
WBC: 6 10*3/uL (ref 4.0–10.5)
nRBC: 0 % (ref 0.0–0.2)

## 2022-03-13 LAB — HCG, QUANTITATIVE, PREGNANCY: hCG, Beta Chain, Quant, S: <1 m[IU]/mL (ref <5)

## 2022-03-13 MED ORDER — IBUPROFEN 800 MG PO TABS: 800.0000 mg | ORAL_TABLET | Freq: Three times a day (TID) | ORAL | 0 refills | Status: DC

## 2022-03-13 NOTE — ED Triage Notes (Signed)
Pt reports lower back pain x 2-3 days. She reports having a headache x 3 days.

## 2022-03-13 NOTE — Discharge Instructions (Addendum)
Ms. Tacheny This is your after visit summary for today.   Malaise with Upper arm weakness labs pending will call with results  Headache Motrin 800 mg TID for 2 days then prn  Upper back pain continue to use the lidocaine  Rest for one day with Motrin to see if this will relieve the generalized pain  Establish Primary Care Provider  Call if symptoms persist or get worse  Please feel free to call our office, if you have any questions. Have a good day. Dionisio David NP
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
## 2022-03-26 ENCOUNTER — Ambulatory Visit
Admission: EM | Admit: 2022-03-26 | Discharge: 2022-03-26 | Disposition: A | Discharge status: Home / Self Care | Payer: Medicaid Other | Attending: Internal Medicine | Admitting: Internal Medicine

## 2022-03-26 DIAGNOSIS — J029 Acute pharyngitis, unspecified: Secondary | ICD-10-CM | POA: Diagnosis not present

## 2022-03-26 DIAGNOSIS — J028 Acute pharyngitis due to other specified organisms: Secondary | ICD-10-CM | POA: Diagnosis not present

## 2022-03-26 DIAGNOSIS — R059 Cough, unspecified: Secondary | ICD-10-CM | POA: Diagnosis present

## 2022-03-26 DIAGNOSIS — B9789 Other viral agents as the cause of diseases classified elsewhere: Secondary | ICD-10-CM | POA: Insufficient documentation

## 2022-03-26 DIAGNOSIS — Z28310 Unvaccinated for covid-19: Secondary | ICD-10-CM | POA: Insufficient documentation

## 2022-03-26 DIAGNOSIS — Z20822 Contact with and (suspected) exposure to covid-19: Secondary | ICD-10-CM | POA: Insufficient documentation

## 2022-03-26 LAB — GROUP A STREP BY PCR: Group A Strep by PCR: NOT DETECTED

## 2022-03-26 MED ORDER — BENZONATATE 100 MG PO CAPS: 100.0000 mg | ORAL_CAPSULE | Freq: Three times a day (TID) | ORAL | 0 refills | Status: DC | PRN

## 2022-03-26 NOTE — ED Triage Notes (Signed)
Patient presents to Urgent Care with complaints of  sore throat, ear pain, and cough since Friday. Treating HA with Excedrin.

## 2022-03-28 LAB — SARS CORONAVIRUS 2 (TAT 6-24 HRS): SARS Coronavirus 2: NEGATIVE
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
## 2022-04-23 ENCOUNTER — Ambulatory Visit: Payer: Self-pay

## 2022-05-01 ENCOUNTER — Encounter: Payer: Self-pay | Admitting: Obstetrics and Gynecology

## 2022-05-01 ENCOUNTER — Ambulatory Visit (INDEPENDENT_AMBULATORY_CARE_PROVIDER_SITE_OTHER): Payer: Medicaid Other | Admitting: Obstetrics and Gynecology

## 2022-05-01 VITALS — BP 100/70 | Ht 62.0 in | Wt 130.0 lb

## 2022-05-01 DIAGNOSIS — R1031 Right lower quadrant pain: Secondary | ICD-10-CM

## 2022-05-01 LAB — POCT URINE PREGNANCY: Preg Test, Ur: NEGATIVE
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
## 2022-05-03 ENCOUNTER — Encounter: Payer: Self-pay | Admitting: Obstetrics and Gynecology

## 2022-05-10 ENCOUNTER — Ambulatory Visit
Admission: EM | Admit: 2022-05-10 | Discharge: 2022-05-10 | Disposition: A | Discharge status: Home / Self Care | Payer: Medicaid Other | Attending: Physician Assistant | Admitting: Physician Assistant

## 2022-05-10 ENCOUNTER — Encounter: Payer: Self-pay | Admitting: Emergency Medicine

## 2022-05-10 ENCOUNTER — Other Ambulatory Visit: Payer: Self-pay

## 2022-05-10 ENCOUNTER — Ambulatory Visit (INDEPENDENT_AMBULATORY_CARE_PROVIDER_SITE_OTHER): Payer: Medicaid Other

## 2022-05-10 DIAGNOSIS — M549 Dorsalgia, unspecified: Secondary | ICD-10-CM | POA: Diagnosis not present

## 2022-05-10 DIAGNOSIS — G8929 Other chronic pain: Secondary | ICD-10-CM

## 2022-05-10 MED ORDER — NAPROXEN 500 MG PO TABS: 500.0000 mg | ORAL_TABLET | Freq: Two times a day (BID) | ORAL | 0 refills | Status: DC | PRN

## 2022-05-10 MED ORDER — BACLOFEN 10 MG PO TABS: 10.0000 mg | ORAL_TABLET | Freq: Three times a day (TID) | ORAL | 0 refills | Status: DC | PRN

## 2022-05-10 NOTE — ED Triage Notes (Signed)
Pt c/o thoracic back pain that radiates down her left leg. She also states she has had cramping in her pelvic area. She states she just had a menstrual period and it was her first since she was about 13. She states she was seen by her GYN during this time.
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
## 2022-05-10 NOTE — Discharge Instructions (Signed)
BACK PAIN: X-rays are normal. Stressed avoiding painful activities . RICE (REST, ICE, COMPRESSION, ELEVATION) guidelines reviewed. May alternate ice and heat. Consider use of muscle rubs, Salonpas patches, etc. Use medications as directed including muscle relaxers if prescribed. Take anti-inflammatory medications as prescribed or OTC NSAIDs/Tylenol.  F/u with PCP in 7-10 days for reexamination, and please feel free to call or return to the urgent care at any time for any questions or concerns you may have and we will be happy to help you!   BACK PAIN RED FLAGS: If the back pain acutely worsens or there are any red flag symptoms such as numbness/tingling, leg weakness, saddle anesthesia, or loss of bowel/bladder control, go immediately to the ER. Follow up with [place] as scheduled or sooner if the pain does not begin to resolve or if it worsens before the follow up

## 2022-06-01 ENCOUNTER — Encounter: Payer: Self-pay | Admitting: Licensed Practical Nurse
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
## 2022-06-05 ENCOUNTER — Encounter: Payer: Self-pay | Admitting: Obstetrics and Gynecology

## 2022-06-05 ENCOUNTER — Ambulatory Visit (INDEPENDENT_AMBULATORY_CARE_PROVIDER_SITE_OTHER): Payer: Medicaid Other | Admitting: Obstetrics and Gynecology

## 2022-06-05 VITALS — BP 98/60 | Ht 62.0 in | Wt 131.0 lb

## 2022-06-05 DIAGNOSIS — Z30013 Encounter for initial prescription of injectable contraceptive: Secondary | ICD-10-CM | POA: Diagnosis not present

## 2022-06-05 DIAGNOSIS — F320 Major depressive disorder, single episode, mild: Secondary | ICD-10-CM

## 2022-06-05 DIAGNOSIS — Z3202 Encounter for pregnancy test, result negative: Secondary | ICD-10-CM | POA: Diagnosis not present

## 2022-06-05 DIAGNOSIS — R1032 Left lower quadrant pain: Secondary | ICD-10-CM | POA: Diagnosis not present

## 2022-06-05 LAB — POCT URINE PREGNANCY: Preg Test, Ur: NEGATIVE

## 2022-06-05 MED ORDER — SERTRALINE HCL 50 MG PO TABS: 50.0000 mg | ORAL_TABLET | Freq: Every day | ORAL | 2 refills | Status: DC

## 2022-06-05 MED ORDER — MEDROXYPROGESTERONE ACETATE 150 MG/ML IM SUSP
150.0000 mg | INTRAMUSCULAR | Status: AC
  Administered 2022-06-05: 150 mg via INTRAMUSCULAR

## 2022-06-17 ENCOUNTER — Ambulatory Visit: Payer: Self-pay

## 2022-06-18 ENCOUNTER — Ambulatory Visit: Payer: Self-pay

## 2022-06-18 ENCOUNTER — Ambulatory Visit
Admission: EM | Admit: 2022-06-18 | Discharge: 2022-06-18 | Disposition: A | Discharge status: Home / Self Care | Payer: Medicaid Other | Attending: Emergency Medicine | Admitting: Emergency Medicine

## 2022-06-18 DIAGNOSIS — M778 Other enthesopathies, not elsewhere classified: Secondary | ICD-10-CM

## 2022-06-18 MED ORDER — PREDNISONE 10 MG (21) PO TBPK: ORAL_TABLET | ORAL | 0 refills | Status: DC

## 2022-06-18 NOTE — ED Triage Notes (Signed)
Patient reports that her left pinky to her elbow hurts -- Started about 2 weeks ago. Patient reports that it has been swelling. Ice has not helped.   Patient reports that her arm feels like it is asleep.
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
## 2022-06-18 NOTE — Discharge Instructions (Signed)
Take the prednisone as directed by the package.  Apply moist heat to your wrist and forearm for 20 minutes at a time, 2-3 times a day to improve blood flow and decrease inflammation.  Rest your left arm as much as possible.  Return for re-evaluation if you develop new or worsening symptoms.

## 2022-06-27 ENCOUNTER — Ambulatory Visit
Admission: EM | Admit: 2022-06-27 | Discharge: 2022-06-27 | Disposition: A | Discharge status: Home / Self Care | Payer: Medicaid Other | Attending: Family | Admitting: Family

## 2022-06-27 ENCOUNTER — Encounter: Payer: Self-pay | Admitting: Emergency Medicine

## 2022-06-27 DIAGNOSIS — R519 Headache, unspecified: Secondary | ICD-10-CM | POA: Diagnosis not present

## 2022-06-27 DIAGNOSIS — R112 Nausea with vomiting, unspecified: Secondary | ICD-10-CM

## 2022-06-27 MED ORDER — KETOROLAC TROMETHAMINE 60 MG/2ML IM SOLN
60.0000 mg | Freq: Once | INTRAMUSCULAR | Status: AC
  Administered 2022-06-27: 60 mg via INTRAMUSCULAR

## 2022-06-27 MED ORDER — SUMATRIPTAN SUCCINATE 50 MG PO TABS: 50.0000 mg | ORAL_TABLET | Freq: Once | ORAL | 1 refills | Status: DC

## 2022-06-27 MED ORDER — ONDANSETRON 4 MG PO TBDP
4.0000 mg | ORAL_TABLET | Freq: Once | ORAL | Status: AC
  Administered 2022-06-27: 4 mg via ORAL

## 2022-06-27 MED ORDER — ONDANSETRON 4 MG PO TBDP: 4.0000 mg | ORAL_TABLET | Freq: Three times a day (TID) | ORAL | 0 refills | Status: DC | PRN
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
## 2022-06-27 NOTE — ED Triage Notes (Signed)
Pt presents with a HA on the right side of her head x 3 days. She reports light sensitivity, nausea and vomiting.

## 2022-06-27 NOTE — Discharge Instructions (Addendum)
You were given Toradol 60mg  today to help with headache pain. You were also given Zofran 4mg  to help with nausea. You can continue this medication every 8 hours as needed for nausea. Recommend restart Imitrex 50mg  tablet- take at onset of migraine headache, may repeat 1 tablet in 2 hours if needed- do not take more than 3 tablets in 24 hours. Rest. Continue to monitor headache. Follow-up within 48 hours if headache does not resolve.

## 2022-08-24 ENCOUNTER — Encounter (INDEPENDENT_AMBULATORY_CARE_PROVIDER_SITE_OTHER): Payer: Self-pay

## 2022-08-30 ENCOUNTER — Ambulatory Visit: Admit: 2022-08-30 | Payer: Medicaid Other

## 2022-08-31 ENCOUNTER — Ambulatory Visit
Admission: EM | Admit: 2022-08-31 | Discharge: 2022-08-31 | Disposition: A | Discharge status: Home / Self Care | Payer: Medicaid Other | Attending: Internal Medicine | Admitting: Internal Medicine

## 2022-08-31 ENCOUNTER — Ambulatory Visit (INDEPENDENT_AMBULATORY_CARE_PROVIDER_SITE_OTHER): Payer: Medicaid Other

## 2022-08-31 ENCOUNTER — Ambulatory Visit: Payer: Medicaid Other

## 2022-08-31 DIAGNOSIS — R1030 Lower abdominal pain, unspecified: Secondary | ICD-10-CM | POA: Diagnosis not present

## 2022-08-31 DIAGNOSIS — K5900 Constipation, unspecified: Secondary | ICD-10-CM | POA: Insufficient documentation

## 2022-08-31 DIAGNOSIS — R109 Unspecified abdominal pain: Secondary | ICD-10-CM | POA: Diagnosis not present

## 2022-08-31 DIAGNOSIS — R102 Pelvic and perineal pain: Secondary | ICD-10-CM | POA: Insufficient documentation

## 2022-08-31 LAB — URINALYSIS, ROUTINE W REFLEX MICROSCOPIC
Bilirubin Urine: NEGATIVE
Glucose, UA: NEGATIVE mg/dL
Hgb urine dipstick: NEGATIVE
Leukocytes,Ua: NEGATIVE
Nitrite: NEGATIVE
Specific Gravity, Urine: >1.03 — ABNORMAL HIGH (ref 1.005–1.030)
pH: 5.5 (ref 5.0–8.0)

## 2022-08-31 LAB — URINALYSIS, MICROSCOPIC (REFLEX)

## 2022-08-31 LAB — PREGNANCY, URINE: Preg Test, Ur: NEGATIVE
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
## 2022-08-31 NOTE — Discharge Instructions (Addendum)
Your abdominal x-ray today shows a heavy stool burden consistent with a diagnosis of constipation.  This is likely the cause of your diffuse abdominal pain.  Recommend you use MiraLAX laxative added to any fluid twice a day until you have daily soft bowel movements.  Then reduce doses of MiraLAX until bowel movements returned to normal.  Also recommend that you follow-up with your GI provider to reschedule colonoscopy and discuss your abnormal stool consistency.  Go to the ED or return to urgent care if your symptoms worsen or you develop new symptoms.

## 2022-08-31 NOTE — ED Triage Notes (Signed)
Chief Complaint: lower abdominal pain with movement and coughing. States the pain is constant but worse with exertion.  Patient having painful urination. States having to strain with bowel movements.  No diarrhea or vomiting.  LMP: Patient has condition where she does not have a period. Has history of cyst on the left ovary.   Onset: Wednesday night  OTC medications tried: No   Sick exposure: No  New foods or medications: No  Recent Travel: No

## 2022-09-01 LAB — URINE CULTURE

## 2022-09-20 ENCOUNTER — Ambulatory Visit: Payer: Medicaid Other

## 2022-09-27 ENCOUNTER — Ambulatory Visit
Admission: EM | Admit: 2022-09-27 | Discharge: 2022-09-27 | Disposition: A | Discharge status: Home / Self Care | Payer: Medicaid Other | Attending: Physician Assistant | Admitting: Physician Assistant

## 2022-09-27 ENCOUNTER — Encounter: Payer: Self-pay | Admitting: Emergency Medicine

## 2022-09-27 DIAGNOSIS — R6884 Jaw pain: Secondary | ICD-10-CM

## 2022-09-27 DIAGNOSIS — K0510 Chronic gingivitis, plaque induced: Secondary | ICD-10-CM | POA: Diagnosis not present

## 2022-09-27 DIAGNOSIS — K0889 Other specified disorders of teeth and supporting structures: Secondary | ICD-10-CM

## 2022-09-27 DIAGNOSIS — M26622 Arthralgia of left temporomandibular joint: Secondary | ICD-10-CM

## 2022-09-27 MED ORDER — NAPROXEN 500 MG PO TABS: 500.0000 mg | ORAL_TABLET | Freq: Two times a day (BID) | ORAL | 0 refills | Status: AC

## 2022-09-27 MED ORDER — HYDROCODONE-ACETAMINOPHEN 5-325 MG PO TABS: 1.0000 | ORAL_TABLET | Freq: Three times a day (TID) | ORAL | 0 refills | Status: AC | PRN

## 2022-09-27 MED ORDER — AMOXICILLIN-POT CLAVULANATE 875-125 MG PO TABS: 1.0000 | ORAL_TABLET | Freq: Two times a day (BID) | ORAL | 0 refills | Status: AC

## 2022-09-27 MED ORDER — KETOROLAC TROMETHAMINE 60 MG/2ML IM SOLN
30.0000 mg | Freq: Once | INTRAMUSCULAR | Status: AC
  Administered 2022-09-27: 30 mg via INTRAMUSCULAR
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
## 2022-09-27 NOTE — Discharge Instructions (Addendum)
-  I believe your pain and swelling is due to TMJ arthralgia.  See handout.  I have given you an injection of a anti-inflammatory medication in the clinic and prescribed naproxen which is an anti-inflammatory medicine.  I also sent pain medication in case you also need it.  Additionally I would like you to start icing your cheek/jaw every couple of hours. - I sent amoxicillin to the pharmacy in case symptoms are due to an infection at all but I think this is less likely. - Follow-up with PCP or dentist if you are not feeling better over the next few days.  Go to ER for any acute worsening of symptoms repeated fever.

## 2022-09-27 NOTE — ED Triage Notes (Signed)
Pt presents with left side dental pain x 2 days.

## 2022-09-28 ENCOUNTER — Emergency Department
Admission: EM | Admit: 2022-09-28 | Discharge: 2022-09-28 | Disposition: A | Discharge status: Home / Self Care | Payer: Medicaid Other

## 2022-09-28 ENCOUNTER — Ambulatory Visit
Admission: EM | Admit: 2022-09-28 | Discharge: 2022-09-28 | Disposition: A | Discharge status: Home / Self Care | Payer: Medicaid Other | Attending: Family Medicine | Admitting: Family Medicine

## 2022-09-28 ENCOUNTER — Encounter: Payer: Self-pay | Admitting: Emergency Medicine

## 2022-09-28 DIAGNOSIS — R6884 Jaw pain: Secondary | ICD-10-CM | POA: Diagnosis not present

## 2022-09-28 MED ORDER — KETOROLAC TROMETHAMINE 60 MG/2ML IM SOLN
30.0000 mg | Freq: Once | INTRAMUSCULAR | Status: AC
  Administered 2022-09-28: 30 mg via INTRAMUSCULAR

## 2022-09-28 NOTE — Discharge Instructions (Addendum)
Your jaw pain and swelling may be due to TMJ inflammation or dental abscess. You were given an injection of a anti-inflammatory medication in the clinic that helped with the pain.  Continue using the prescribed naproxen, Norco and antibiotics previously prescribed.  Icing your cheek/jaw every couple of hours can provide additional pain relief.  Follow-up with PCP or dentist if you are not feeling better over the next few days.  Go to ER for any acute worsening of symptoms or  fever.
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
## 2022-09-28 NOTE — ED Triage Notes (Signed)
Patient c/o right sided TMJ for the past 3 days.  Patient states that her pain has gotten worse.

## 2022-10-05 ENCOUNTER — Ambulatory Visit: Payer: Medicaid Other | Admitting: Nurse Practitioner

## 2022-10-11 ENCOUNTER — Ambulatory Visit: Payer: Medicaid Other | Admitting: Nurse Practitioner

## 2022-12-06 ENCOUNTER — Ambulatory Visit: Admit: 2022-12-06 | Discharge status: Absent without leave | Payer: Medicaid Other

## 2022-12-06 ENCOUNTER — Ambulatory Visit: Payer: Medicaid Other | Admitting: Family Medicine

## 2022-12-22 ENCOUNTER — Emergency Department
Admission: EM | Admit: 2022-12-22 | Discharge: 2022-12-22 | Disposition: A | Discharge status: Home / Self Care | Payer: 59 | Attending: Emergency Medicine | Admitting: Emergency Medicine

## 2022-12-22 ENCOUNTER — Other Ambulatory Visit: Payer: Self-pay

## 2022-12-22 DIAGNOSIS — G43809 Other migraine, not intractable, without status migrainosus: Secondary | ICD-10-CM | POA: Diagnosis not present

## 2022-12-22 DIAGNOSIS — R519 Headache, unspecified: Secondary | ICD-10-CM | POA: Diagnosis present

## 2022-12-22 LAB — COMPREHENSIVE METABOLIC PANEL
ALT: 17 U/L (ref 0–44)
AST: 20 U/L (ref 15–41)
Albumin: 4.2 g/dL (ref 3.5–5.0)
Alkaline Phosphatase: 48 U/L (ref 38–126)
Anion gap: 7 (ref 5–15)
BUN: 20 mg/dL (ref 6–20)
CO2: 26 mmol/L (ref 22–32)
Calcium: 8.8 mg/dL — ABNORMAL LOW (ref 8.9–10.3)
Chloride: 104 mmol/L (ref 98–111)
Creatinine, Ser: 0.68 mg/dL (ref 0.44–1.00)
GFR, Estimated: >60 mL/min (ref >60)
Glucose, Bld: 91 mg/dL (ref 70–99)
Potassium: 3.4 mmol/L — ABNORMAL LOW (ref 3.5–5.1)
Sodium: 137 mmol/L (ref 135–145)
Total Bilirubin: 0.5 mg/dL (ref 0.3–1.2)
Total Protein: 6.7 g/dL (ref 6.5–8.1)

## 2022-12-22 LAB — CBC WITH DIFFERENTIAL/PLATELET
Abs Immature Granulocytes: 0.02 10*3/uL (ref 0.00–0.07)
Basophils Absolute: 0.1 10*3/uL (ref 0.0–0.1)
Basophils Relative: 1 %
Eosinophils Absolute: 0.2 10*3/uL (ref 0.0–0.5)
Eosinophils Relative: 3 %
HCT: 42.4 % (ref 36.0–46.0)
Hemoglobin: 14.1 g/dL (ref 12.0–15.0)
Immature Granulocytes: 0 %
Lymphocytes Relative: 46 %
Lymphs Abs: 3.4 10*3/uL (ref 0.7–4.0)
MCH: 31.1 pg (ref 26.0–34.0)
MCHC: 33.3 g/dL (ref 30.0–36.0)
MCV: 93.6 fL (ref 80.0–100.0)
Monocytes Absolute: 0.9 10*3/uL (ref 0.1–1.0)
Monocytes Relative: 13 %
Neutro Abs: 2.7 10*3/uL (ref 1.7–7.7)
Neutrophils Relative %: 37 %
Platelets: 260 10*3/uL (ref 150–400)
RBC: 4.53 MIL/uL (ref 3.87–5.11)
RDW: 12 % (ref 11.5–15.5)
WBC: 7.2 10*3/uL (ref 4.0–10.5)
nRBC: 0 % (ref 0.0–0.2)

## 2022-12-22 MED ORDER — KETOROLAC TROMETHAMINE 15 MG/ML IJ SOLN
15.0000 mg | Freq: Once | INTRAMUSCULAR | Status: AC
  Administered 2022-12-22: 15 mg via INTRAVENOUS
  Filled 2022-12-22: qty 1

## 2022-12-22 MED ORDER — METOCLOPRAMIDE HCL 5 MG/ML IJ SOLN
10.0000 mg | Freq: Once | INTRAMUSCULAR | Status: AC
  Administered 2022-12-22: 10 mg via INTRAVENOUS
  Filled 2022-12-22: qty 2

## 2022-12-22 MED ORDER — DIPHENHYDRAMINE HCL 50 MG/ML IJ SOLN
50.0000 mg | Freq: Once | INTRAMUSCULAR | Status: AC
  Administered 2022-12-22: 50 mg via INTRAVENOUS
  Filled 2022-12-22: qty 1

## 2022-12-22 NOTE — ED Triage Notes (Signed)
Pt to ED from home for migraine. Has HX of migraines. Pt is prescribed imitrex for her migraines but does not have a PCP to have it refilled. Pt was also prescribed eye drops for the pressure within her eye as well. Pt is CAOx4, ambulatory in triage and in no acute distress.

## 2022-12-22 NOTE — ED Provider Notes (Signed)
[facility] Provider Note  Patient Contact: 10:42 PM (approximate)   History   Migraine (X 3 days)   HPI  Carmen Black is a 22 y.o. female with a history of ADHD, headaches, depression and anxiety, presents to the emergency department with right-sided headache and pressure behind her right eye.  Patient reports that her migraines typically feel this way.  She has had some associated nausea and vomiting and typically has vomiting with her migraines.  No falls or mechanisms of trauma.  No fever or chills.  Patient reports that headache came on slowly and progressed in intensity gradually.      Physical Exam   Triage Vital Signs: ED Triage Vitals  Enc Vitals Group     BP 12/22/22 2153 127/82     Pulse Rate 12/22/22 2153 88     Resp 12/22/22 2153 16     Temp 12/22/22 2153 98.8 F (37.1 C)     Temp Source 12/22/22 2153 Oral     SpO2 12/22/22 2153 98 %     Weight 12/22/22 2151 135 lb (61.2 kg)     Height 12/22/22 2151 5\' 2"  (1.575 m)     Head Circumference --      Peak Flow --      Pain Score 12/22/22 2151 8     Pain Loc --      Pain Edu? --      Excl. in GC? --     Most recent vital signs: Vitals:   12/22/22 2153  BP: 127/82  Pulse: 88  Resp: 16  Temp: 98.8 F (37.1 C)  SpO2: 98%     General: Alert and in no acute distress. Eyes:  PERRL. EOMI. Head: No acute traumatic findings ENT:      Nose: No congestion/rhinnorhea.      Mouth/Throat: Mucous membranes are moist. Neck: No stridor. No cervical spine tenderness to palpation. Cardiovascular:  Good peripheral perfusion Respiratory: Normal respiratory effort without tachypnea or retractions. Lungs CTAB. Good air entry to the bases with no decreased or absent breath sounds. Gastrointestinal: Bowel sounds 4 quadrants. Soft and nontender to palpation. No guarding or rigidity. No palpable masses. No distention. No CVA tenderness. Musculoskeletal: Full range of motion to all extremities.   Neurologic:  No gross focal neurologic deficits are appreciated.  Skin:   No rash noted    ED Results / Procedures / Treatments   Labs (all labs ordered are listed, but only abnormal results are displayed) Labs Reviewed  COMPREHENSIVE METABOLIC PANEL - Abnormal; Notable for the following components:      Result Value   Potassium 3.4 (*)    Calcium 8.8 (*)    All other components within normal limits  CBC WITH DIFFERENTIAL/PLATELET      PROCEDURES:  Critical Care performed: No  Procedures   MEDICATIONS ORDERED IN ED: Medications  metoCLOPramide (REGLAN) injection 10 mg (10 mg Intravenous Given 12/22/22 2254)  diphenhydrAMINE (BENADRYL) injection 50 mg (50 mg Intravenous Given 12/22/22 2254)  ketorolac (TORADOL) 15 MG/ML injection 15 mg (15 mg Intravenous Given 12/22/22 2259)     IMPRESSION / MDM / ASSESSMENT AND PLAN / ED COURSE  I reviewed the triage vital signs and the nursing notes.                              Assessment and plan Headache 22 year old female presents to the emergency department with right-sided frontal  headache.  Vital signs are reassuring at triage.  On exam, patient was alert, active and nontoxic-appearing with no apparent neurodeficits.  Tonometry reading of the right eye, 10 Will obtain basic labs and administer migraine cocktail and will reassess.  CBC and CMP reassuring.  Patient reported that her headache improved significantly after Reglan, Benadryl and Toradol.  Return precautions were given to return with new or worsening symptoms.  All patient questions were answered.     FINAL CLINICAL IMPRESSION(S) / ED DIAGNOSES   Final diagnoses:  Other migraine without status migrainosus, not intractable     Rx / DC Orders   ED Discharge Orders     None        Note:  This document was prepared using Dragon voice recognition software and may include unintentional dictation errors.   Pia Mau Hemphill, Cordelia Poche 12/22/22 2330     Jene Every, MD 12/24/22 1524

## 2023-01-09 ENCOUNTER — Ambulatory Visit: Payer: 59

## 2023-01-17 ENCOUNTER — Ambulatory Visit
Admission: EM | Admit: 2023-01-17 | Discharge: 2023-01-17 | Disposition: A | Discharge status: Home / Self Care | Payer: 59 | Attending: Emergency Medicine | Admitting: Emergency Medicine

## 2023-01-17 ENCOUNTER — Ambulatory Visit: Payer: Self-pay

## 2023-01-17 DIAGNOSIS — Z1152 Encounter for screening for COVID-19: Secondary | ICD-10-CM | POA: Insufficient documentation

## 2023-01-17 DIAGNOSIS — M5416 Radiculopathy, lumbar region: Secondary | ICD-10-CM | POA: Diagnosis present

## 2023-01-17 DIAGNOSIS — J069 Acute upper respiratory infection, unspecified: Secondary | ICD-10-CM | POA: Diagnosis present

## 2023-01-17 DIAGNOSIS — R059 Cough, unspecified: Secondary | ICD-10-CM | POA: Insufficient documentation

## 2023-01-17 LAB — SARS CORONAVIRUS 2 BY RT PCR: SARS Coronavirus 2 by RT PCR: NEGATIVE

## 2023-01-17 LAB — GROUP A STREP BY PCR: Group A Strep by PCR: NOT DETECTED

## 2023-01-17 MED ORDER — PREDNISONE 20 MG PO TABS: 40.0000 mg | ORAL_TABLET | Freq: Every day | ORAL | 0 refills | Status: DC

## 2023-01-17 MED ORDER — PROMETHAZINE-DM 6.25-15 MG/5ML PO SYRP: 5.0000 mL | ORAL_SOLUTION | Freq: Every evening | ORAL | 0 refills | Status: DC | PRN

## 2023-01-17 MED ORDER — CYCLOBENZAPRINE HCL 10 MG PO TABS: 10.0000 mg | ORAL_TABLET | Freq: Two times a day (BID) | ORAL | 0 refills | Status: DC | PRN

## 2023-01-17 MED ORDER — KETOROLAC TROMETHAMINE 30 MG/ML IJ SOLN
30.0000 mg | Freq: Once | INTRAMUSCULAR | Status: AC
Start: 2023-01-17 — End: 2023-01-17
  Administered 2023-01-17: 30 mg via INTRAMUSCULAR

## 2023-01-17 NOTE — ED Triage Notes (Signed)
Pt c/o fatigue, bilateral leg pain, pt states having pain when walking, headache, congestion, sore throat yesterday that has now subsided.

## 2023-01-17 NOTE — Discharge Instructions (Signed)
Your symptoms today are most likely being caused by a virus and should steadily improve in time it can take up to 7 to 10 days before you truly start to see a turnaround however things will get better  Open and strep testing are negative  On exam you have tenderness to the left-sided middle side of your back, this is most likely the cause of your leg pain and therefore we will use medicines to help calm this down  You have been given an injection of Toradol here today in the office which ideally will start to reduce symptoms in about 30 minutes Tylenol from the regular of the day as well as a muscle relaxer as needed  Starting tomorrow take prednisone every morning with food for 5 days to continue to help reduce inflammation and irritation to the back, you may take Tylenol in addition to this as needed  You may use muscle relaxant twice daily, be mindful this may make you feel sleepy  You may use cough syrup at bedtime, be mindful of this will make you sleepy    You can take Tylenol and/or Ibuprofen as needed for fever reduction and pain relief.   For cough: honey 1/2 to 1 teaspoon (you can dilute the honey in water or another fluid).  You can also use guaifenesin and dextromethorphan for cough. You can use a humidifier for chest congestion and cough.  If you don't have a humidifier, you can sit in the bathroom with the hot shower running.      For sore throat: try warm salt water gargles, cepacol lozenges, throat spray, warm tea or water with lemon/honey, popsicles or ice, or OTC cold relief medicine for throat discomfort.   For congestion: take a daily anti-histamine like Zyrtec, Claritin, and a oral decongestant, such as pseudoephedrine.  You can also use Flonase 1-2 sprays in each nostril daily.   It is important to stay hydrated: drink plenty of fluids (water, gatorade/powerade/pedialyte, juices, or teas) to keep your throat moisturized and help further relieve irritation/discomfort.
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
## 2023-01-21 ENCOUNTER — Other Ambulatory Visit: Payer: Self-pay

## 2023-01-21 ENCOUNTER — Ambulatory Visit (INDEPENDENT_AMBULATORY_CARE_PROVIDER_SITE_OTHER): Payer: 59 | Admitting: Gastroenterology

## 2023-01-21 ENCOUNTER — Encounter: Payer: Self-pay | Admitting: Gastroenterology

## 2023-01-21 VITALS — BP 116/80 | HR 84 | Temp 98.2°F | Ht 62.0 in | Wt 135.1 lb

## 2023-01-21 DIAGNOSIS — Z800 Family history of malignant neoplasm of digestive organs: Secondary | ICD-10-CM

## 2023-01-21 MED ORDER — NA SULFATE-K SULFATE-MG SULF 17.5-3.13-1.6 GM/177ML PO SOLN: 354.0000 mL | Freq: Once | ORAL | 0 refills | Status: AC

## 2023-01-21 NOTE — Patient Instructions (Signed)
Take Miralax 17 grams daily.   High-Fiber Eating Plan Fiber, also called dietary fiber, is a type of carbohydrate. It is found foods such as fruits, vegetables, whole grains, and beans. A high-fiber diet can have many health benefits. Your health care provider may recommend a high-fiber diet to help: Prevent constipation. Fiber can make your bowel movements more regular. Lower your cholesterol. Relieve the following conditions: Inflammation of veins in the anus (hemorrhoids). Inflammation of specific areas of the digestive tract (uncomplicated diverticulosis). A problem of the large intestine, also called the colon, that sometimes causes pain and diarrhea (irritable bowel syndrome, or IBS). Prevent overeating as part of a weight-loss plan. Prevent heart disease, type 2 diabetes, and certain cancers. What are tips for following this plan? Reading food labels  Check the nutrition facts label on food products for the amount of dietary fiber. Choose foods that have 5 grams of fiber or more per serving. The goals for recommended daily fiber intake include: Men (age 22 or younger): 34-38 g. Men (over age 40): 28-34 g. Women (age 15 or younger): 25-28 g. Women (over age 49): 22-25 g. Your daily fiber goal is _____________ g. Shopping Choose whole fruits and vegetables instead of processed forms, such as apple juice or applesauce. Choose a wide variety of high-fiber foods such as avocados, lentils, oats, and kidney beans. Read the nutrition facts label of the foods you choose. Be aware of foods with added fiber. These foods often have high sugar and sodium amounts per serving. Cooking Use whole-grain flour for baking and cooking. Cook with brown rice instead of white rice. Meal planning Start the day with a breakfast that is high in fiber, such as a cereal that contains 5 g of fiber or more per serving. Eat breads and cereals that are made with whole-grain flour instead of refined flour or  white flour. Eat brown rice, bulgur wheat, or millet instead of white rice. Use beans in place of meat in soups, salads, and pasta dishes. Be sure that half of the grains you eat each day are whole grains. General information You can get the recommended daily intake of dietary fiber by: Eating a variety of fruits, vegetables, grains, nuts, and beans. Taking a fiber supplement if you are not able to take in enough fiber in your diet. It is better to get fiber through food than from a supplement. Gradually increase how much fiber you consume. If you increase your intake of dietary fiber too quickly, you may have bloating, cramping, or gas. Drink plenty of water to help you digest fiber. Choose high-fiber snacks, such as berries, raw vegetables, nuts, and popcorn. What foods should I eat? Fruits Berries. Pears. Apples. Oranges. Avocado. Prunes and raisins. Dried figs. Vegetables Sweet potatoes. Spinach. Kale. Artichokes. Cabbage. Broccoli. Cauliflower. Green peas. Carrots. Squash. Grains Whole-grain breads. Multigrain cereal. Oats and oatmeal. Brown rice. Barley. Bulgur wheat. Millet. Quinoa. Bran muffins. Popcorn. Rye wafer crackers. Meats and other proteins Navy beans, kidney beans, and pinto beans. Soybeans. Split peas. Lentils. Nuts and seeds. Dairy Fiber-fortified yogurt. Beverages Fiber-fortified soy milk. Fiber-fortified orange juice. Other foods Fiber bars. The items listed above may not be a complete list of recommended foods and beverages. Contact a dietitian for more information. What foods should I avoid? Fruits Fruit juice. Cooked, strained fruit. Vegetables Fried potatoes. Canned vegetables. Well-cooked vegetables. Grains White bread. Pasta made with refined flour. White rice. Meats and other proteins Fatty cuts of meat. Fried chicken or fried fish. Dairy Milk.  Yogurt. Cream cheese. Sour cream. Fats and oils Butters. Beverages Soft drinks. Other foods Cakes and  pastries. The items listed above may not be a complete list of foods and beverages to avoid. Talk with your dietitian about what choices are best for you. Summary Fiber is a type of carbohydrate. It is found in foods such as fruits, vegetables, whole grains, and beans. A high-fiber diet has many benefits. It can help to prevent constipation, lower blood cholesterol, aid weight loss, and reduce your risk of heart disease, diabetes, and certain cancers. Increase your intake of fiber gradually. Increasing fiber too quickly may cause cramping, bloating, and gas. Drink plenty of water while you increase the amount of fiber you consume. The best sources of fiber include whole fruits and vegetables, whole grains, nuts, seeds, and beans. This information is not intended to replace advice given to you by your health care provider. Make sure you discuss any questions you have with your health care provider. Document Revised: 01/21/2020 Document Reviewed: 01/21/2020 Elsevier Patient Education  2023 [employer].

## 2023-01-21 NOTE — Progress Notes (Signed)
Arlyss Repress, MD [address]  [address]  [place], [place] [address]  Main: [phone]  Fax: [phone]    Gastroenterology Consultation  Referring Provider:     Kara Dies, NP Primary Care Physician:  Pcp, No Primary Gastroenterologist:  Dr. Arlyss Repress Reason for Consultation:     ***        HPI:   Carmen Black is a 22 y.o. female referred by Dr. Oneita Hurt, No  for consultation & management of ***  NSAIDs: ***  Antiplts/Anticoagulants/Anti thrombotics: ***  GI Procedures: ***  Past Medical History:  Diagnosis Date   ADHD (attention deficit hyperactivity disorder)    Anxiety    Compulsive behavior disorder    Depression    Headache     Past Surgical History:  Procedure Laterality Date   NO PAST SURGERIES      Prior to Admission medications   Medication Sig Start Date End Date Taking? Authorizing Provider  ondansetron (ZOFRAN-ODT) 4 MG disintegrating tablet Take 1 tablet (4 mg total) by mouth every 8 (eight) hours as needed for nausea. 06/27/22  Yes Amyot, Ali Lowe, NP  promethazine-dextromethorphan (PROMETHAZINE-DM) 6.25-15 MG/5ML syrup Take 5 mLs by mouth at bedtime as needed for cough. 01/17/23  Yes White, Adrienne R, NP  sertraline (ZOLOFT) 50 MG tablet Take 1 tablet (50 mg total) by mouth daily. 06/05/22  Yes Copland, Ilona Sorrel, PA-C  SUMAtriptan (IMITREX) 50 MG tablet Take 1 tablet (50 mg total) by mouth once for 1 dose. May repeat in 2 hours if headache persists or recurs. Do not take more than 3 tablets in 24 hours. 06/27/22 01/21/23 Yes Amyot, Ali Lowe, NP  norelgestromin-ethinyl estradiol [employer] [facility]ico) 150-35 MCG/24HR transdermal patch Place 1 patch onto the skin once a week. Leave patch off on week 4 for 1 week Patient not taking: Reported on 11/12/2019 06/03/19 11/29/19  Vena Austria, MD    Family History  Problem Relation Age of Onset   Diabetes Mother    Hypertension Mother    Bipolar disorder Mother    Deep vein thrombosis Mother     Migraines Mother    Seizures Mother    Cervical cancer Mother        30s   Colon cancer Father    Schizophrenia Paternal Aunt    Clotting disorder Paternal Aunt        blood clots in legs   Heart disease Maternal Grandmother    Hepatitis Maternal Grandmother    Clotting disorder Maternal Grandmother        blood clots in legs   Depression Half-Brother    Depression Half-Sister    Kidney disease Maternal Aunt    Kidney disease Paternal Uncle    Hepatitis Paternal Grandmother    Heart disease Paternal Grandmother      Social History   Tobacco Use   Smoking status: Some Days    Types: Cigars   Smokeless tobacco: Never  Vaping Use   Vaping Use: Some days   Substances: Nicotine, Flavoring  Substance Use Topics   Alcohol use: Yes    Comment: social   Drug use: Yes    Frequency: 1.0 times per week    Types: Marijuana    Comment: last used new years 2023    Allergies as of 01/21/2023   (No Known Allergies)    Review of Systems:    All systems reviewed and negative except where noted in HPI.   Physical Exam:  BP 116/80 (  BP Location: Right Arm, Patient Position: Sitting, Cuff Size: Normal)   Pulse 84   Temp 98.2 F (36.8 C) (Oral)   Ht  (1.575 m)   Wt 135 lb 2 oz (61.3 kg)   BMI 24.71 kg/m  No LMP recorded. (Menstrual status: Other).  General:   Alert,  Well-developed, well-nourished, pleasant and cooperative in NAD Head:  Normocephalic and atraumatic. Eyes:  Sclera clear, no icterus.   Conjunctiva pink. Ears:  Normal auditory acuity. Nose:  No deformity, discharge, or lesions. Mouth:  No deformity or lesions,oropharynx pink & moist. Neck:  Supple; no masses or thyromegaly. Lungs:  Respirations even and unlabored.  Clear throughout to auscultation.   No wheezes, crackles, or rhonchi. No acute distress. Heart:  Regular rate and rhythm; no murmurs, clicks, rubs, or gallops. Abdomen:  Normal bowel sounds. Soft, non-tender and non-distended without masses,  hepatosplenomegaly or hernias noted.  No guarding or rebound tenderness.   Rectal: Not performed Msk:  Symmetrical without gross deformities. Good, equal movement & strength bilaterally. Pulses:  Normal pulses noted. Extremities:  No clubbing or edema.  No cyanosis. Neurologic:  Alert and oriented x3;  grossly normal neurologically. Skin:  Intact without significant lesions or rashes. No jaundice. Lymph Nodes:  No significant cervical adenopathy. Psych:  Alert and cooperative. Normal mood and affect.  Imaging Studies: ***  Assessment and Plan:   Carmen Black is a 22 y.o. female with ***   Follow up in ***   Arlyss Repress, MD

## 2023-01-22 ENCOUNTER — Encounter: Payer: Self-pay | Admitting: Gastroenterology

## 2023-01-22 ENCOUNTER — Other Ambulatory Visit: Payer: Self-pay

## 2023-01-22 ENCOUNTER — Ambulatory Visit
Admission: RE | Admit: 2023-01-22 | Discharge: 2023-01-22 | Disposition: A | Discharge status: Home / Self Care | Payer: 59 | Source: Ambulatory Visit | Attending: Emergency Medicine | Admitting: Emergency Medicine

## 2023-01-22 VITALS — BP 129/78 | HR 80 | Temp 99.0°F | Resp 20

## 2023-01-22 DIAGNOSIS — J0100 Acute maxillary sinusitis, unspecified: Secondary | ICD-10-CM | POA: Diagnosis not present

## 2023-01-22 DIAGNOSIS — R519 Headache, unspecified: Secondary | ICD-10-CM | POA: Diagnosis not present

## 2023-01-22 DIAGNOSIS — S0990XD Unspecified injury of head, subsequent encounter: Secondary | ICD-10-CM

## 2023-01-22 MED ORDER — AZITHROMYCIN 250 MG PO TABS: 250.0000 mg | ORAL_TABLET | Freq: Every day | ORAL | 0 refills | Status: DC

## 2023-01-22 MED ORDER — IBUPROFEN 800 MG PO TABS: 800.0000 mg | ORAL_TABLET | Freq: Three times a day (TID) | ORAL | 0 refills | Status: DC
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
## 2023-01-22 NOTE — Discharge Instructions (Signed)
On exam there is no abnormalities to the scalp that would indicate infection or complication of your hematoma, increased pain is most likely due to hair manipulation and the tension that comes from ponytails, I would suggest putting here for freely or using a looser method to pull back back hair such as a braid  At this time I do not have concern regarding concussion, neurologically brain function is intact  On exam there is tenderness to your sinus over your right cheek and as your congestion and ear fullness has been present for at least a week will provide coverage to ensure that this is not contributing to your symptoms, take azithromycin as directed  You do have a history of cluster headaches and this is most likely being exacerbated by current symptoms  You may use ibuprofen 800 mg every 8 hours in addition to Tylenol 500 to 1000 mg every 6 hours as needed for management, if headaches persist or become severe you may follow-up with urgent care setting for reevaluation

## 2023-01-22 NOTE — ED Triage Notes (Addendum)
Pt had syncopal episode on Thursday and has a knot to back of head.  She was dx with hematoma, dehydration and dc'd but continues to have head pain, blurred vision and nausea. And symptoms started today.  Also continues to have nasal congestion and cough. Was seen for symptoms and dx with virus Denies fever

## 2023-01-24 ENCOUNTER — Other Ambulatory Visit (HOSPITAL_COMMUNITY)
Admission: RE | Admit: 2023-01-24 | Discharge: 2023-01-24 | Disposition: A | Discharge status: Home / Self Care | Payer: Medicaid Other | Source: Ambulatory Visit | Attending: Obstetrics and Gynecology | Admitting: Obstetrics and Gynecology

## 2023-01-24 ENCOUNTER — Encounter: Payer: Self-pay | Admitting: Obstetrics and Gynecology

## 2023-01-24 ENCOUNTER — Ambulatory Visit (INDEPENDENT_AMBULATORY_CARE_PROVIDER_SITE_OTHER): Payer: Medicaid Other | Admitting: Obstetrics and Gynecology

## 2023-01-24 VITALS — BP 111/71 | HR 88 | Ht 62.0 in | Wt 134.4 lb

## 2023-01-24 DIAGNOSIS — Z113 Encounter for screening for infections with a predominantly sexual mode of transmission: Secondary | ICD-10-CM | POA: Insufficient documentation

## 2023-01-24 DIAGNOSIS — Z3169 Encounter for other general counseling and advice on procreation: Secondary | ICD-10-CM | POA: Diagnosis not present

## 2023-01-24 DIAGNOSIS — N912 Amenorrhea, unspecified: Secondary | ICD-10-CM | POA: Diagnosis not present
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
## 2023-01-28 ENCOUNTER — Other Ambulatory Visit: Payer: Self-pay

## 2023-01-28 DIAGNOSIS — B379 Candidiasis, unspecified: Secondary | ICD-10-CM

## 2023-01-28 DIAGNOSIS — B9689 Other specified bacterial agents as the cause of diseases classified elsewhere: Secondary | ICD-10-CM

## 2023-01-28 LAB — CERVICOVAGINAL ANCILLARY ONLY
Bacterial Vaginitis (gardnerella): POSITIVE — AB
Candida Glabrata: NEGATIVE
Candida Vaginitis: POSITIVE — AB
Chlamydia: NEGATIVE
Comment: NEGATIVE
Comment: NEGATIVE
Comment: NEGATIVE
Comment: NEGATIVE
Comment: NEGATIVE
Comment: NORMAL
Neisseria Gonorrhea: NEGATIVE
Trichomonas: NEGATIVE

## 2023-01-28 MED ORDER — FLUCONAZOLE 150 MG PO TABS
150.0000 mg | ORAL_TABLET | Freq: Every day | ORAL | 0 refills | Status: AC
Start: 2023-01-28 — End: ?

## 2023-01-28 MED ORDER — METRONIDAZOLE 500 MG PO TABS
500.0000 mg | ORAL_TABLET | Freq: Two times a day (BID) | ORAL | 0 refills | Status: AC
Start: 2023-01-28 — End: ?

## 2023-01-31 ENCOUNTER — Ambulatory Visit: Admit: 2023-01-31 | Payer: Medicaid Other | Admitting: Gastroenterology

## 2023-01-31 LAB — HEPATITIS B SURFACE ANTIGEN: Hepatitis B Surface Ag: NEGATIVE

## 2023-01-31 LAB — HIV ANTIBODY (ROUTINE TESTING W REFLEX): HIV Screen 4th Generation wRfx: NONREACTIVE

## 2023-01-31 LAB — ANTI MULLERIAN HORMONE: ANTI-MULLERIAN HORMONE (AMH): 1.15 ng/mL

## 2023-01-31 LAB — TSH: TSH: 0.834 u[IU]/mL (ref 0.450–4.500)

## 2023-01-31 LAB — TESTOSTERONE, FREE, TOTAL, SHBG
Sex Hormone Binding: 47.5 nmol/L (ref 24.6–122.0)
Testosterone, Free: 1.4 pg/mL (ref 0.0–4.2)
Testosterone: 40 ng/dL (ref 13–71)

## 2023-01-31 LAB — FSH/LH
FSH: 5 m[IU]/mL
LH: 2.4 m[IU]/mL

## 2023-01-31 LAB — HEPATITIS C ANTIBODY: Hep C Virus Ab: NONREACTIVE

## 2023-01-31 LAB — DHEA-SULFATE: DHEA-SO4: 507 ug/dL — ABNORMAL HIGH (ref 110.0–431.7)

## 2023-01-31 LAB — RPR: RPR Ser Ql: NONREACTIVE

## 2023-01-31 LAB — INSULIN, RANDOM: INSULIN: 10.1 u[IU]/mL (ref 2.6–24.9)

## 2023-01-31 LAB — PROLACTIN: Prolactin: 6.7 ng/mL (ref 4.8–33.4)

## 2023-01-31 SURGERY — COLONOSCOPY WITH PROPOFOL
Anesthesia: General

## 2023-02-05 ENCOUNTER — Telehealth: Payer: Self-pay

## 2023-02-05 NOTE — Telephone Encounter (Signed)
[employer]: Patient calling for 01/24/23 lab results. She was advised to call for results instead of coming in.

## 2023-02-06 ENCOUNTER — Other Ambulatory Visit: Payer: Self-pay

## 2023-02-06 DIAGNOSIS — Z3009 Encounter for other general counseling and advice on contraception: Secondary | ICD-10-CM

## 2023-02-06 DIAGNOSIS — N912 Amenorrhea, unspecified: Secondary | ICD-10-CM

## 2023-02-06 DIAGNOSIS — Z30011 Encounter for initial prescription of contraceptive pills: Secondary | ICD-10-CM

## 2023-02-06 MED ORDER — NORETHIN ACE-ETH ESTRAD-FE 1-20 MG-MCG PO TABS
1.0000 | ORAL_TABLET | Freq: Every day | ORAL | 3 refills | Status: AC
Start: 2023-02-06 — End: ?

## 2023-02-06 NOTE — Telephone Encounter (Signed)
Spoke with patient about results and recommendations. 3 month Rx of OCP's sent in to pharmacy. Made made aware to follow-up in 3 months.

## 2023-04-04 ENCOUNTER — Ambulatory Visit
Admission: RE | Admit: 2023-04-04 | Discharge: 2023-04-04 | Disposition: A | Discharge status: Home / Self Care | Payer: Medicaid Other | Source: Ambulatory Visit | Attending: Nurse Practitioner | Admitting: Nurse Practitioner

## 2023-04-04 ENCOUNTER — Ambulatory Visit (INDEPENDENT_AMBULATORY_CARE_PROVIDER_SITE_OTHER): Payer: Medicaid Other

## 2023-04-04 VITALS — BP 114/73 | HR 89 | Temp 98.8°F | Resp 16

## 2023-04-04 DIAGNOSIS — S63616A Unspecified sprain of right little finger, initial encounter: Secondary | ICD-10-CM

## 2023-04-04 MED ORDER — IBUPROFEN 600 MG PO TABS
600.0000 mg | ORAL_TABLET | Freq: Three times a day (TID) | ORAL | 0 refills | Status: DC | PRN
Start: 2023-04-04 — End: 2024-02-26

## 2023-04-04 NOTE — ED Triage Notes (Addendum)
Pt injured her right pinky hitting a boxing block on the wall x 1 week.
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
## 2023-04-04 NOTE — Discharge Instructions (Signed)
Your x-ray was negative for fracture.  Please keep your finger in the splint for comfort and support.  You may take ibuprofen every 8 hours as needed for pain.  Continue to ice and elevate.  Please follow-up with [facility] next week for further evaluation of your symptoms.  Please go to the ER for any worsening symptoms.

## 2023-05-28 ENCOUNTER — Ambulatory Visit
Admission: EM | Admit: 2023-05-28 | Discharge: 2023-05-28 | Disposition: A | Discharge status: Home / Self Care | Payer: Medicaid Other | Attending: Emergency Medicine | Admitting: Emergency Medicine

## 2023-05-28 DIAGNOSIS — J039 Acute tonsillitis, unspecified: Secondary | ICD-10-CM | POA: Insufficient documentation

## 2023-05-28 LAB — GROUP A STREP BY PCR: Group A Strep by PCR: NOT DETECTED

## 2023-05-28 MED ORDER — AZITHROMYCIN 250 MG PO TABS: 250.0000 mg | ORAL_TABLET | Freq: Every day | ORAL | 0 refills | Status: DC

## 2023-05-28 MED ORDER — NYSTATIN 100000 UNIT/ML MT SUSP: 5.0000 mL | Freq: Four times a day (QID) | OROMUCOSAL | 0 refills | Status: DC | PRN
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
## 2023-05-28 NOTE — ED Triage Notes (Signed)
Pt c/o sore throat & bilateral ear pain x3 days. Denies any fevers. Has tried Theraflu,dayquil & Nyquil w/o relief.

## 2023-05-28 NOTE — Discharge Instructions (Addendum)
Your symptoms today are most likely being caused by a virus and should steadily improve in time it can take up to 7 to 10 days before you truly start to see a turnaround however things will get better  Strep test is negative for bacteria but based on the presentation of your throat you have been started on antibiotic prophylactically  Take azithromycin as directed  You may gargle and spit Magic mouthwash solution every 4-6 hours to provide temporary relief to your throat    You can take Tylenol and/or Ibuprofen as needed for fever reduction and pain relief.   For cough: honey 1/2 to 1 teaspoon (you can dilute the honey in water or another fluid).  You can also use guaifenesin and dextromethorphan for cough. You can use a humidifier for chest congestion and cough.  If you don't have a humidifier, you can sit in the bathroom with the hot shower running.      For sore throat: try warm salt water gargles, cepacol lozenges, throat spray, warm tea or water with lemon/honey, popsicles or ice, or OTC cold relief medicine for throat discomfort.   For congestion: take a daily anti-histamine like Zyrtec, Claritin, and a oral decongestant, such as pseudoephedrine.  You can also use Flonase 1-2 sprays in each nostril daily.   It is important to stay hydrated: drink plenty of fluids (water, gatorade/powerade/pedialyte, juices, or teas) to keep your throat moisturized and help further relieve irritation/discomfort.

## 2023-06-03 ENCOUNTER — Emergency Department
Admission: EM | Admit: 2023-06-03 | Discharge: 2023-06-03 | Disposition: A | Discharge status: Home / Self Care | Payer: Medicaid Other | Attending: Emergency Medicine | Admitting: Emergency Medicine

## 2023-06-03 ENCOUNTER — Emergency Department: Payer: Medicaid Other

## 2023-06-03 ENCOUNTER — Other Ambulatory Visit: Payer: Self-pay

## 2023-06-03 DIAGNOSIS — N83202 Unspecified ovarian cyst, left side: Secondary | ICD-10-CM | POA: Diagnosis not present

## 2023-06-03 DIAGNOSIS — R102 Pelvic and perineal pain: Secondary | ICD-10-CM

## 2023-06-03 LAB — URINALYSIS, ROUTINE W REFLEX MICROSCOPIC
Bilirubin Urine: NEGATIVE
Glucose, UA: NEGATIVE mg/dL
Hgb urine dipstick: NEGATIVE
Ketones, ur: NEGATIVE mg/dL
Nitrite: NEGATIVE
Protein, ur: NEGATIVE mg/dL
Specific Gravity, Urine: 1.025 (ref 1.005–1.030)
pH: 7 (ref 5.0–8.0)

## 2023-06-03 LAB — COMPREHENSIVE METABOLIC PANEL
ALT: 14 U/L (ref 0–44)
AST: 17 U/L (ref 15–41)
Albumin: 4.4 g/dL (ref 3.5–5.0)
Alkaline Phosphatase: 51 U/L (ref 38–126)
Anion gap: 8 (ref 5–15)
BUN: 14 mg/dL (ref 6–20)
CO2: 25 mmol/L (ref 22–32)
Calcium: 9.1 mg/dL (ref 8.9–10.3)
Chloride: 103 mmol/L (ref 98–111)
Creatinine, Ser: 0.66 mg/dL (ref 0.44–1.00)
GFR, Estimated: >60 mL/min (ref >60)
Glucose, Bld: 104 mg/dL — ABNORMAL HIGH (ref 70–99)
Potassium: 3.6 mmol/L (ref 3.5–5.1)
Sodium: 136 mmol/L (ref 135–145)
Total Bilirubin: 0.5 mg/dL (ref 0.3–1.2)
Total Protein: 7.5 g/dL (ref 6.5–8.1)

## 2023-06-03 LAB — CBC
HCT: 41.1 % (ref 36.0–46.0)
Hemoglobin: 13.9 g/dL (ref 12.0–15.0)
MCH: 30.9 pg (ref 26.0–34.0)
MCHC: 33.8 g/dL (ref 30.0–36.0)
MCV: 91.3 fL (ref 80.0–100.0)
Platelets: 290 10*3/uL (ref 150–400)
RBC: 4.5 MIL/uL (ref 3.87–5.11)
RDW: 11.9 % (ref 11.5–15.5)
WBC: 9.2 10*3/uL (ref 4.0–10.5)
nRBC: 0 % (ref 0.0–0.2)

## 2023-06-03 LAB — POC URINE PREG, ED: Preg Test, Ur: NEGATIVE

## 2023-06-03 LAB — LIPASE, BLOOD: Lipase: 38 U/L (ref 11–51)

## 2023-06-03 MED ORDER — KETOROLAC TROMETHAMINE 30 MG/ML IJ SOLN
15.0000 mg | Freq: Once | INTRAMUSCULAR | Status: AC
  Administered 2023-06-03: 15 mg via INTRAVENOUS
  Filled 2023-06-03: qty 1

## 2023-06-03 MED ORDER — ONDANSETRON 4 MG PO TBDP: 4.0000 mg | ORAL_TABLET | Freq: Three times a day (TID) | ORAL | 0 refills | Status: DC | PRN

## 2023-06-03 NOTE — ED Triage Notes (Signed)
Pt reporting left lower abdominal pain 'feels like pain from my ovaries". Crampy and sharp pain, worse when she walks. States the last time she had this pain she had a hormonal cyst. White discharge, period is 6 days late, but has irregular periods.

## 2023-06-03 NOTE — ED Provider Notes (Signed)
[facility] Provider Note    Event Date/Time   First MD Initiated Contact with Patient 06/03/23 2043     (approximate)   History   Chief Complaint Abdominal Pain   HPI  Carmen Black is a 22 y.o. female with past medical history of bipolar disorder and PTSD who presents to the ED complaining of abdominal pain.  Patient reports that last night she developed pain in the left lower quadrant of her abdomen.  Pain had been present constantly and relatively mild up until earlier this evening, when it became more severe.  She states it is painful for her to walk and symptoms feel similar to when she dealt with an ovarian cyst in the past.  She denies any vaginal bleeding or discharge, does state that she is a few days late on her menstrual period.  She has not had any fevers, flank pain, dysuria, nausea, vomiting, or diarrhea.     Physical Exam   Triage Vital Signs: ED Triage Vitals  Encounter Vitals Group     BP 06/03/23 2033 126/69     Systolic BP Percentile --      Diastolic BP Percentile --      Pulse Rate 06/03/23 2033 92     Resp 06/03/23 2033 16     Temp 06/03/23 2033 98.4 F (36.9 C)     Temp src --      SpO2 06/03/23 2033 99 %     Weight 06/03/23 2031 138 lb 14.2 oz (63 kg)     Height 06/03/23 2031 5\' 2"  (1.575 m)     Head Circumference --      Peak Flow --      Pain Score 06/03/23 2031 8     Pain Loc --      Pain Education --      Exclude from Growth Chart --     Most recent vital signs: Vitals:   06/03/23 2033  BP: 126/69  Pulse: 92  Resp: 16  Temp: 98.4 F (36.9 C)  SpO2: 99%    Constitutional: Alert and oriented. Eyes: Conjunctivae are normal. Head: Atraumatic. Nose: No congestion/rhinnorhea. Mouth/Throat: Mucous membranes are moist.  Cardiovascular: Normal rate, regular rhythm. Grossly normal heart sounds.  2+ radial pulses bilaterally. Respiratory: Normal respiratory effort.  No retractions. Lungs CTAB. Gastrointestinal:  Soft and nontender. No distention. Musculoskeletal: No lower extremity tenderness nor edema.  Neurologic:  Normal speech and language. No gross focal neurologic deficits are appreciated.    ED Results / Procedures / Treatments   Labs (all labs ordered are listed, but only abnormal results are displayed) Labs Reviewed  COMPREHENSIVE METABOLIC PANEL - Abnormal; Notable for the following components:      Result Value   Glucose, Bld 104 (*)    All other components within normal limits  URINALYSIS, ROUTINE W REFLEX MICROSCOPIC - Abnormal; Notable for the following components:   APPearance CLOUDY (*)    Leukocytes,Ua MODERATE (*)    Bacteria, UA RARE (*)    All other components within normal limits  LIPASE, BLOOD  CBC  POC URINE PREG, ED   RADIOLOGY Pelvic ultrasound reviewed and interpreted by me with left ovarian cyst, no evidence of torsion.  PROCEDURES:  Critical Care performed: No  Procedures   MEDICATIONS ORDERED IN ED: Medications  ketorolac (TORADOL) 30 MG/ML injection 15 mg (15 mg Intravenous Given 06/03/23 2237)     IMPRESSION / MDM / ASSESSMENT AND PLAN / ED COURSE  I reviewed the triage vital signs and the nursing notes.                              22 y.o. female with past medical history of bipolar disorder and PTSD who presents to the ED complaining of increasing pain in her left lower quadrant and left pelvic area since last night.  Patient's presentation is most consistent with acute presentation with potential threat to life or bodily function.  Differential diagnosis includes, but is not limited to, ovarian torsion, ovarian cyst, ectopic pregnancy, kidney stone, UTI, colitis, diverticulitis.  Patient nontoxic-appearing and in no acute distress, vital signs are unremarkable.  Her abdomen is soft without any focal tenderness, patient describes symptoms as similar to prior ovarian cyst and we will further assess with ultrasound.  Pregnancy testing is  negative, urinalysis and labs are pending at this time.  Patient declines pain or nausea medication.  Labs are reassuring with no significant anemia, leukocytosis, acute abnormality, or AKI.  LFTs and lipase are unremarkable, urinalysis shows no signs of infection.  Pelvic ultrasound shows left ovarian cyst with no evidence of torsion.  Patient did have worsening of pain during ultrasound which improved following dose of IV Toradol.  She is appropriate for outpatient management with OB/GYN follow-up, was counseled to return to the ED for new or worsening symptoms.  Patient agrees with plan.      FINAL CLINICAL IMPRESSION(S) / ED DIAGNOSES   Final diagnoses:  Pelvic pain  Cyst of left ovary     Rx / DC Orders   ED Discharge Orders          Ordered    ondansetron (ZOFRAN-ODT) 4 MG disintegrating tablet  Every 8 hours PRN        06/03/23 2315             Note:  This document was prepared using Dragon voice recognition software and may include unintentional dictation errors.   Chesley Noon, MD 06/03/23 [phone]

## 2023-06-05 ENCOUNTER — Telehealth: Payer: Self-pay

## 2023-06-05 ENCOUNTER — Ambulatory Visit
Admission: EM | Admit: 2023-06-05 | Discharge: 2023-06-05 | Disposition: A | Discharge status: Home / Self Care | Payer: Medicaid Other | Attending: Emergency Medicine | Admitting: Emergency Medicine

## 2023-06-05 DIAGNOSIS — T23151A Burn of first degree of right palm, initial encounter: Secondary | ICD-10-CM

## 2023-06-05 HISTORY — DX: Burn of first degree of right palm, initial encounter: T23.151A

## 2023-06-05 MED ORDER — SILVER SULFADIAZINE 1 % EX CREA: TOPICAL_CREAM | Freq: Once | CUTANEOUS | Status: DC

## 2023-06-05 MED ORDER — SILVER SULFADIAZINE 1 % EX CREA: 1.0000 | TOPICAL_CREAM | Freq: Every day | CUTANEOUS | 0 refills | Status: AC
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
## 2023-06-05 NOTE — ED Triage Notes (Signed)
Patient to Urgent Care with complaints of a burn present to her right palm.  Reports she fell forward and put her hand down onto a hot plate warmer. Incident occurred this evening. Palm of hand is red, reports stinging. No blistering present.  TDAP 2022.

## 2023-06-05 NOTE — Transitions of Care (Post Inpatient/ED Visit) (Signed)
Vallery Ridge NP is listed as PCP; I do not see where pt has ever been seen at [facility] and no future appt scheduled. Per appt notes pt already has appt scheduled with Dr Brennan Bailey OB GYN on 06/07/23 at 9:35 am.per ED record pt was seen at [facility] ED on 06/03/23 with pelvic and perineal pain; hx of ovarian cyst. Sending note to Vallery Ridge NP to see if wants to be left on pts chart as PCP.       06/05/2023  Name: Carmen Black MRN: [id number] DOB: [birth year]  Today's TOC FU Call Status:    Attempted to reach the patient regarding the most recent Inpatient/ED visit.  Follow Up Plan: No further outreach attempts will be made at this time. We have been unable to contact the patient.  Signature Lewanda Rife, LPN

## 2023-06-05 NOTE — Discharge Instructions (Signed)
Rest,daily dressing changes with silvadene and nonstick telfa/bulky dressing, follow up with PCP in 2 days for wound check,sooner if worse. May take tylenol/ibuprofen as label directed for pain.

## 2023-06-07 ENCOUNTER — Encounter: Payer: Self-pay | Admitting: Obstetrics and Gynecology

## 2023-06-07 ENCOUNTER — Ambulatory Visit (INDEPENDENT_AMBULATORY_CARE_PROVIDER_SITE_OTHER): Payer: Medicaid Other | Admitting: Obstetrics and Gynecology

## 2023-06-07 VITALS — BP 98/66 | HR 81 | Ht 62.0 in | Wt 138.0 lb

## 2023-06-07 DIAGNOSIS — N83202 Unspecified ovarian cyst, left side: Secondary | ICD-10-CM

## 2023-06-07 DIAGNOSIS — R102 Pelvic and perineal pain: Secondary | ICD-10-CM
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
## 2023-06-07 NOTE — Progress Notes (Signed)
Patient presents today to discuss on ovarian cyst. She states pelvic pain over the last week that made her go to the ED.

## 2023-06-19 ENCOUNTER — Ambulatory Visit: Payer: Medicaid Other

## 2023-06-19 DIAGNOSIS — Z800 Family history of malignant neoplasm of digestive organs: Secondary | ICD-10-CM | POA: Insufficient documentation

## 2023-06-19 NOTE — Progress Notes (Deleted)
NURSE VISIT NOTE  Subjective:    Patient ID: GITTY BRASE, female    DOB: 03/21/2001, 22 y.o.   MRN: [id number]  HPI  Patient is a 22 y.o. G0P0000 female who presents for {pe vag discharge desc:315065} vaginal discharge for *** {gen duration:315003}. Denies abnormal vaginal bleeding or significant pelvic pain or fever. {Actions; denies/reports/admits to:19208} {UTI Symptoms:210800002}. Patient {has/denies:315300} history of known exposure to STD.   Objective:    LMP 05/10/2023 (Exact Date)    @THIS  VISIT ONLY@  Assessment:   No diagnosis found.  {vaginitis type:315262}  Plan:   GC and chlamydia DNA  probe sent to lab. Treatment: {vaginitis tx:315263} ROV prn if symptoms persist or worsen.   Loney Laurence, CMA

## 2023-06-21 ENCOUNTER — Ambulatory Visit
Admission: RE | Admit: 2023-06-21 | Discharge: 2023-06-21 | Disposition: A | Discharge status: Home / Self Care | Payer: Medicaid Other | Source: Ambulatory Visit | Attending: Obstetrics and Gynecology

## 2023-06-21 DIAGNOSIS — N83202 Unspecified ovarian cyst, left side: Secondary | ICD-10-CM | POA: Insufficient documentation

## 2023-06-22 DIAGNOSIS — G43109 Migraine with aura, not intractable, without status migrainosus: Secondary | ICD-10-CM | POA: Insufficient documentation

## 2023-07-03 ENCOUNTER — Ambulatory Visit
Admission: EM | Admit: 2023-07-03 | Discharge: 2023-07-03 | Disposition: A | Discharge status: Home / Self Care | Payer: Medicaid Other | Attending: Family Medicine | Admitting: Family Medicine

## 2023-07-03 ENCOUNTER — Ambulatory Visit: Payer: Self-pay

## 2023-07-03 DIAGNOSIS — J069 Acute upper respiratory infection, unspecified: Secondary | ICD-10-CM | POA: Diagnosis not present

## 2023-07-03 DIAGNOSIS — U071 COVID-19: Secondary | ICD-10-CM | POA: Diagnosis not present

## 2023-07-03 DIAGNOSIS — J302 Other seasonal allergic rhinitis: Secondary | ICD-10-CM | POA: Diagnosis present

## 2023-07-03 DIAGNOSIS — H9203 Otalgia, bilateral: Secondary | ICD-10-CM | POA: Diagnosis present

## 2023-07-03 LAB — SARS CORONAVIRUS 2 BY RT PCR: SARS Coronavirus 2 by RT PCR: POSITIVE — AB

## 2023-07-03 LAB — GROUP A STREP BY PCR: Group A Strep by PCR: NOT DETECTED

## 2023-07-03 MED ORDER — CETIRIZINE HCL 10 MG PO TABS: 10.0000 mg | ORAL_TABLET | Freq: Every day | ORAL | 11 refills | Status: DC | PRN

## 2023-07-03 NOTE — Discharge Instructions (Signed)
Luciel your strep test is negative.  We will contact you if your COVID test is positive.  Please quarantine while you wait for the results.  If your test is negative you may resume normal activities.  If your test is positive, quarantine until you are without a fever for at least 24 hours without fever-lowering (Tylenol/Motrin) medications.    If your were prescribed medication, stop by the pharmacy to pick them up.   You can take Tylenol and/or Ibuprofen as needed for fever reduction and pain relief.    For cough: honey 1/2 to 1 teaspoon (you can dilute the honey in water or another fluid).  You can also use guaifenesin and dextromethorphan for cough. You can use a humidifier for chest congestion and cough.  If you don't have a humidifier, you can sit in the bathroom with the hot shower running.      For sore throat: try warm salt water gargles, Mucinex sore throat cough drops or cepacol lozenges, throat spray, warm tea or water with lemon/honey, popsicles or ice, or OTC cold relief medicine for throat discomfort. You can also purchase chloraseptic spray at the pharmacy or dollar store.   For congestion: take a daily anti-histamine like Zyrtec, Claritin, and a oral decongestant, such as pseudoephedrine.  You can also use Flonase 1-2 sprays in each nostril daily. Afrin is also a good option, if you do not have high blood pressure.    It is important to stay hydrated: drink plenty of fluids (water, gatorade/powerade/pedialyte, juices, or teas) to keep your throat moisturized and help further relieve irritation/discomfort.    Return or go to the Emergency Department if symptoms worsen or do not improve in the next few days

## 2023-07-03 NOTE — ED Triage Notes (Signed)
Pt c/o sore throat & bilateral ear pain x2 days. Has tried mucinex & theraflu w/o relief.
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
## 2023-07-15 ENCOUNTER — Encounter: Payer: Self-pay | Admitting: Obstetrics and Gynecology

## 2023-09-03 ENCOUNTER — Ambulatory Visit: Payer: Medicaid Other | Admitting: Obstetrics and Gynecology

## 2023-09-03 DIAGNOSIS — R102 Pelvic and perineal pain: Secondary | ICD-10-CM

## 2023-09-12 ENCOUNTER — Encounter: Payer: Self-pay | Admitting: Obstetrics and Gynecology

## 2023-10-01 NOTE — Progress Notes (Signed)
 [facility] Colorectal Surgery Outpatient Clinic Note   Patient Name: Carmen Black  MRN #: [id number]  Date of Service: October 01, 2023   Admitting Physician: Miguel Fara Kocher,*   Chief Complaint: Evaluation for possible hemorrhoid   Reason for Visit  Possible Hemorrhoid  Assessment  [name] is a 22 y.o. female with no significant PMHx who was referred to clinic by Slater Darice Sole for evaluation of hemorrhoids. On exam, there is a posterior midline anal fissure and sentinel anal skin tag. A step-wise approach to management--starting with conservative measures and employing surgical options (eg, Botox, lateral sphincterotomy) should patient fail to improve-- was discussed with the patient who indicated understanding.   Plan  Would not recommend surgical management of the anal tag at this time; we will focus on management of the anal fissure and can reconsider if her symptoms abate  Recommend topical nifedipine/lidocaine  cream TID Recommend fiber supplementation (eg, Psyllium husk, Metamucil) and increased oral hydration to keep stools soft and minimize straining.  Recommend continuing twice daily sitz baths Can consider cleansing with water to reduce perianal irritation.  RTC 8 weeks   I saw and evaluated the patient, participating in the key portions of the service.  I reviewed the resident's note.  I agree with the resident's findings and plan.  Total time spent: 30 minutes (review of records, history taking, examination, counseling and coordination of care).  [name], MD Colorectal Surgery  Subjective  HPI: Carmen Black is a 22 y.o. female with no significant PMHx or PSHx who was referred to clinic for evaluation of possible hemorrhoids by her PCP Slater Darice Sole. Patient reports 3 months history of perianal bulge with associated fluctuation in size, intermittent dyschezia and passage of BRBPR. She describes the blood as a  small volume and present on toilet paper after wiping. She also endorses perianal pain and discomfort, now improved since starting topical witch hazel, hydrocortisone suppositories and sitz baths.  Patient has a chronic history of constipation with associated straining to defecate. Bowel movements are irregular, with patient sometimes going 2-3 days between bowel movements. She takes milk of magnesium  every day with moderate effect. She is scheduled to undergo colonoscopy to determine etiology.   She denies fever, chills, nausea, vomiting, hematochezia, melena, purulent discharge, fecal incontinence. Patient has never had to manually reduce the bulge.   Review of Systems: A 12 system review of systems was negative except as noted in the HPI.  No Known Allergies Past Medical History:  Diagnosis Date  . Anxiety   . Depression    No past surgical history on file. Current Outpatient Medications  Medication Sig Dispense Refill  . fluticasone  propionate (FLONASE ) 50 mcg/actuation nasal spray 1 spray into each nostril daily.    . hydrocortisone (ANUSOL-HC) 25 mg suppository Insert 1 suppository (25 mg total) into the rectum two (2) times a day as needed for hemorrhoids. 12 suppository 0  . ondansetron  (ZOFRAN -ODT) 4 MG disintegrating tablet 1 tablet (4 mg total).    . sertraline  (ZOLOFT ) 50 MG tablet Take 1 tablet (50 mg total) by mouth daily. 30 tablet 2  . sumatriptan  (IMITREX ) 50 MG tablet Take one tablet once you start to develop symptoms. If symptoms persist or return, may repeat dose ) after =2 hours. Maximum dose: 100 mg/dose; 200 mg per 24 15 tablet 0   No current facility-administered medications for this visit.   Family History  Problem Relation Age of Onset  . Diabetes  Mother   . Hypertension Mother   . Bipolar disorder Mother   . Seizures Mother   . Migraines Mother   . Cervical cancer Mother   . Deep vein thrombosis Mother   . Colon cancer Father        diagnosed mid 29s  .  Heart disease Maternal Grandmother   . Hepatitis Maternal Grandmother   . Clotting disorder Maternal Grandmother   . Hepatitis Paternal Grandmother   . Heart disease Paternal Grandfather   . Schizophrenia Paternal Aunt   . Clotting disorder Paternal Aunt   . Kidney disease Maternal Aunt    Social History   Socioeconomic History  . Marital status: Single    Spouse name: None  . Number of children: None  . Years of education: None  . Highest education level: None  Tobacco Use  . Smoking status: Every Day    Types: Cigars  . Smokeless tobacco: Never  Vaping Use  . Vaping status: Every Day  . Substances: Nicotine , Flavoring  . Devices: Disposable  Substance and Sexual Activity  . Alcohol use: Yes  . Drug use: Yes    Types: Marijuana   Social Drivers of Corporate Investment Banker Strain: Low Risk  (06/21/2023)   Overall Financial Resource Strain (CARDIA)   . Difficulty of Paying Living Expenses: Not hard at all  Food Insecurity: No Food Insecurity (06/21/2023)   Hunger Vital Sign   . Worried About Programme Researcher, Broadcasting/film/video in the Last Year: Never true   . Ran Out of Food in the Last Year: Never true  Transportation Needs: No Transportation Needs (06/21/2023)   PRAPARE - Transportation   . Lack of Transportation (Medical): No   . Lack of Transportation (Non-Medical): No  Stress: No Stress Concern Present (06/21/2023)   [employer] of Occupational Health - Occupational Stress Questionnaire   . Feeling of Stress : Not at all   Objective  Vitals Signs: BP 112/70   Pulse 80   Temp 36.5 C (97.7 F) (Temporal)   Ht 157.5 cm (5' 2)   Wt 62.1 kg (136 lb 14.4 oz)   LMP 09/12/2023 (Exact Date)   BMI 25.04 kg/m   Wt Readings from Last 5 Encounters:  10/01/23 62.1 kg (136 lb 14.4 oz)  09/24/23 60.3 kg (133 lb)  08/06/23 60.1 kg (132 lb 9.6 oz)  06/21/23 61.7 kg (136 lb 1.6 oz)  01/17/23 63.5 kg (140 lb)    Physical Exam: General: alert, well appearing, in no acute  distress Cardiovascular: regular rate, normotensive Pulmonary: breathing comfortably on room air Abdominal: soft, nondistended, and nontender to palpation.  Perineal: Posterior midline anal fissure and adjacent anal skin tag. Otherwise, normal appearing. Extremities: warm and well perfused bilaterally, no edema Skin: skin color, texture, and turgor are normal. No visible rashes or suspicious lesions Musculoskeletal: no joint tenderness, deformity, swelling, or muscular tenderness noted. Full range of motion without pain Neurologic: AAOx3. No visible tremor. Grossly intact neurologically Psychiatric: alert and oriented to person, place, and time. Judgement and insight are appropriate.

## 2023-10-02 ENCOUNTER — Ambulatory Visit
Admission: EM | Admit: 2023-10-02 | Discharge: 2023-10-02 | Disposition: A | Discharge status: Home / Self Care | Payer: Medicaid Other | Attending: Family Medicine | Admitting: Family Medicine

## 2023-10-02 ENCOUNTER — Ambulatory Visit: Payer: Medicaid Other

## 2023-10-02 DIAGNOSIS — M25571 Pain in right ankle and joints of right foot: Secondary | ICD-10-CM | POA: Insufficient documentation

## 2023-10-02 DIAGNOSIS — W19XXXA Unspecified fall, initial encounter: Secondary | ICD-10-CM

## 2023-10-02 DIAGNOSIS — M25561 Pain in right knee: Secondary | ICD-10-CM | POA: Insufficient documentation

## 2023-10-02 DIAGNOSIS — M79604 Pain in right leg: Secondary | ICD-10-CM

## 2023-10-02 DIAGNOSIS — M25551 Pain in right hip: Secondary | ICD-10-CM | POA: Insufficient documentation

## 2023-10-02 DIAGNOSIS — W010XXA Fall on same level from slipping, tripping and stumbling without subsequent striking against object, initial encounter: Secondary | ICD-10-CM | POA: Insufficient documentation

## 2023-10-02 LAB — PREGNANCY, URINE: Preg Test, Ur: NEGATIVE

## 2023-10-02 MED ORDER — DEXAMETHASONE SODIUM PHOSPHATE 10 MG/ML IJ SOLN
10.0000 mg | Freq: Once | INTRAMUSCULAR | Status: AC
  Administered 2023-10-02: 10 mg via INTRAMUSCULAR

## 2023-10-02 MED ORDER — TIZANIDINE HCL 4 MG PO TABS
4.0000 mg | ORAL_TABLET | Freq: Four times a day (QID) | ORAL | 0 refills | Status: DC | PRN
Start: 2023-10-02 — End: 2024-04-07
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
## 2023-10-02 NOTE — Discharge Instructions (Addendum)
 If medication was prescribed, stop by the pharmacy to pick up your prescriptions.  For your  pain, Take 1000 mg Tylenol  3 times a day, take muscle relaxer (Flexeril ; take 2 at bedtime) with take ibuprofen  800 mg every 8 hours e a day,  as needed for pain.  Rest and elevate the affected painful area.  Apply warm compresses intermittently, as needed.  As pain recedes, begin normal activities slowly as tolerated.  Follow up with primary care provider or an orthopedic provider, if symptoms persist.  Follow up with a orthopedic specialist.   Consider [facility] in [facility]  Address: [address] [place], [place] [address] Phone: [phone]  On my review of your xray images, you did not have any fractures or dislocated bones. The radiologist has not yet read your xray. If it is significantly abnormal or urgent, someone will contact you.  You should see your results in MyChart.
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
## 2023-10-03 ENCOUNTER — Ambulatory Visit: Payer: Self-pay

## 2023-11-07 ENCOUNTER — Ambulatory Visit: Payer: Medicaid Other

## 2023-11-27 ENCOUNTER — Other Ambulatory Visit (HOSPITAL_COMMUNITY)
Admission: RE | Admit: 2023-11-27 | Discharge: 2023-11-27 | Disposition: A | Discharge status: Home / Self Care | Payer: Medicaid Other | Source: Ambulatory Visit | Attending: Licensed Practical Nurse | Admitting: Licensed Practical Nurse

## 2023-11-27 ENCOUNTER — Encounter: Payer: Self-pay | Admitting: Licensed Practical Nurse

## 2023-11-27 ENCOUNTER — Ambulatory Visit (INDEPENDENT_AMBULATORY_CARE_PROVIDER_SITE_OTHER): Payer: Medicaid Other | Admitting: Licensed Practical Nurse

## 2023-11-27 VITALS — BP 117/78 | HR 76 | Wt 132.2 lb

## 2023-11-27 DIAGNOSIS — N83202 Unspecified ovarian cyst, left side: Secondary | ICD-10-CM

## 2023-11-27 DIAGNOSIS — Z113 Encounter for screening for infections with a predominantly sexual mode of transmission: Secondary | ICD-10-CM | POA: Insufficient documentation
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
## 2023-11-28 ENCOUNTER — Encounter: Payer: Self-pay | Admitting: Licensed Practical Nurse

## 2023-11-28 DIAGNOSIS — N83202 Unspecified ovarian cyst, left side: Secondary | ICD-10-CM | POA: Insufficient documentation

## 2023-11-29 LAB — CERVICOVAGINAL ANCILLARY ONLY
Bacterial Vaginitis (gardnerella): POSITIVE — AB
Candida Glabrata: NEGATIVE
Candida Vaginitis: POSITIVE — AB
Chlamydia: POSITIVE — AB
Comment: NEGATIVE
Comment: NEGATIVE
Comment: NEGATIVE
Comment: NEGATIVE
Comment: NEGATIVE
Comment: NORMAL
Neisseria Gonorrhea: POSITIVE — AB
Trichomonas: NEGATIVE

## 2023-11-30 ENCOUNTER — Other Ambulatory Visit: Payer: Self-pay | Admitting: Licensed Practical Nurse

## 2023-11-30 ENCOUNTER — Encounter: Payer: Self-pay | Admitting: Licensed Practical Nurse

## 2023-11-30 DIAGNOSIS — A749 Chlamydial infection, unspecified: Secondary | ICD-10-CM

## 2023-11-30 DIAGNOSIS — N760 Acute vaginitis: Secondary | ICD-10-CM

## 2023-11-30 DIAGNOSIS — B379 Candidiasis, unspecified: Secondary | ICD-10-CM

## 2023-11-30 MED ORDER — FLUCONAZOLE 150 MG PO TABS: 150.0000 mg | ORAL_TABLET | ORAL | 0 refills | Status: AC

## 2023-11-30 MED ORDER — METRONIDAZOLE 500 MG PO TABS: 500.0000 mg | ORAL_TABLET | Freq: Two times a day (BID) | ORAL | 0 refills | Status: DC

## 2023-11-30 MED ORDER — DOXYCYCLINE HYCLATE 100 MG PO CAPS
100.0000 mg | ORAL_CAPSULE | Freq: Two times a day (BID) | ORAL | 0 refills | Status: DC
Start: 2023-11-30 — End: 2023-12-30

## 2023-11-30 MED ORDER — ZITHROMAX 500 MG PO TABS: 1000.0000 mg | ORAL_TABLET | Freq: Once | ORAL | 1 refills | Status: AC

## 2023-12-02 ENCOUNTER — Ambulatory Visit (INDEPENDENT_AMBULATORY_CARE_PROVIDER_SITE_OTHER)

## 2023-12-02 VITALS — BP 117/70 | HR 81 | Ht 62.0 in | Wt 135.6 lb

## 2023-12-02 DIAGNOSIS — A549 Gonococcal infection, unspecified: Secondary | ICD-10-CM

## 2023-12-02 MED ORDER — CEFTRIAXONE SODIUM 500 MG IJ SOLR
500.0000 mg | Freq: Once | INTRAMUSCULAR | Status: AC
Start: 2023-12-02 — End: 2023-12-02
  Administered 2023-12-02: 500 mg via INTRAMUSCULAR

## 2023-12-02 NOTE — Progress Notes (Signed)
    NURSE VISIT NOTE  Subjective:    Patient ID: Carmen Black, female    DOB: May 04, 2001, 23 y.o.   MRN: [id number]  HPI  Patient is a 23 y.o. G0P0000 female who presents for Rocephin injection. Order to administer given by Carie Caddy, CNM.  Objective:    BP 117/70   Pulse 81   Ht 5\' 2"  (1.575 m)   Wt 135 lb 9.6 oz (61.5 kg)   LMP 11/05/2023 (Exact Date)   BMI 24.80 kg/m   Rocephin 500mg mg given IM by: Georgiana Shore, CMA. Site: Left Upper Outer [place]and[place].     Assessment:   1. Gonorrhea      Plan:   Return  to clinic 4 weeks for TOC.   Loman Chroman, CMA

## 2023-12-11 ENCOUNTER — Ambulatory Visit
Admission: RE | Admit: 2023-12-11 | Discharge: 2023-12-11 | Disposition: A | Discharge status: Home / Self Care | Source: Ambulatory Visit | Attending: Licensed Practical Nurse | Admitting: Licensed Practical Nurse

## 2023-12-11 DIAGNOSIS — N83202 Unspecified ovarian cyst, left side: Secondary | ICD-10-CM

## 2023-12-12 ENCOUNTER — Encounter: Payer: Self-pay | Admitting: Licensed Practical Nurse

## 2023-12-24 IMAGING — CR DG KNEE COMPLETE 4+V*R*
5 series · 5 of 5 positions shown · non-contrast
Comparison: None.

CLINICAL DATA: Trauma, pain

EXAM:
RIGHT KNEE - COMPLETE upright 4+ VIEW

[knee ap]
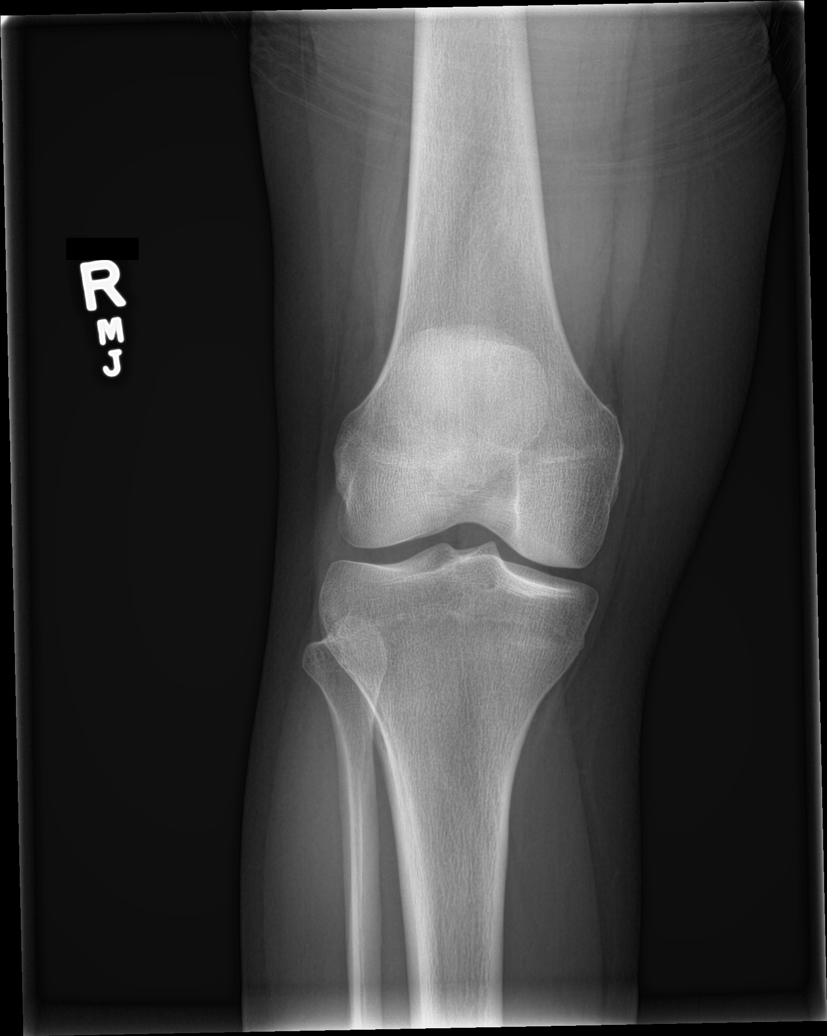

[knee lat (1 of 2)]
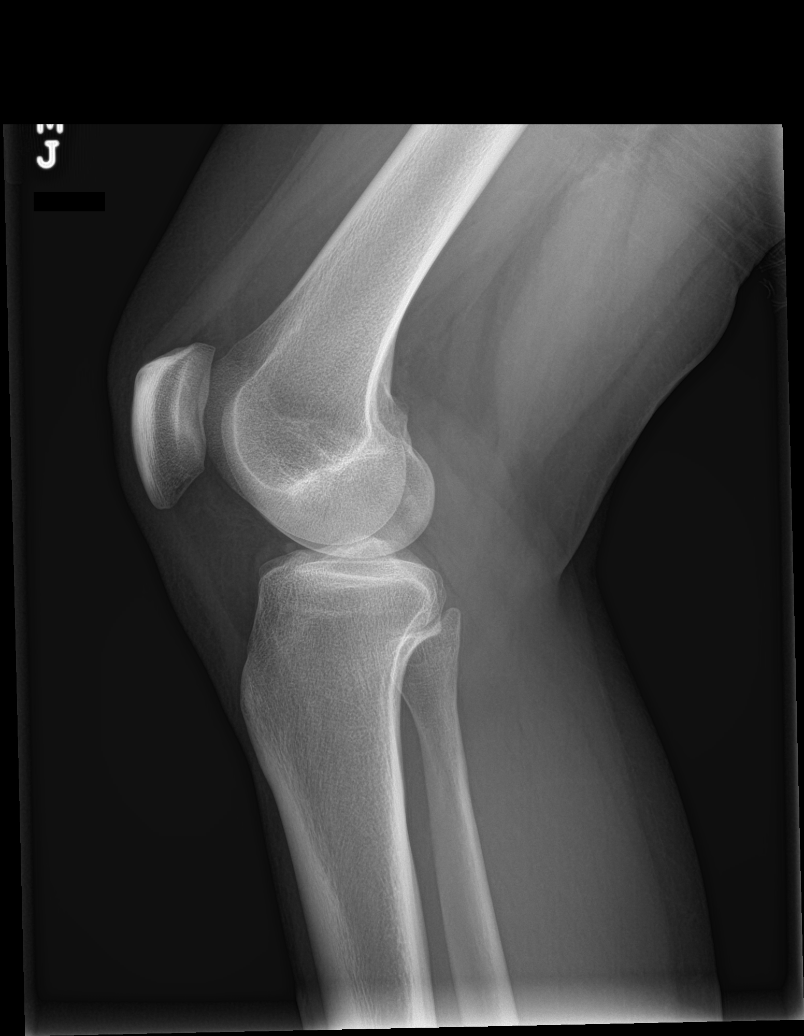

[tunnel]
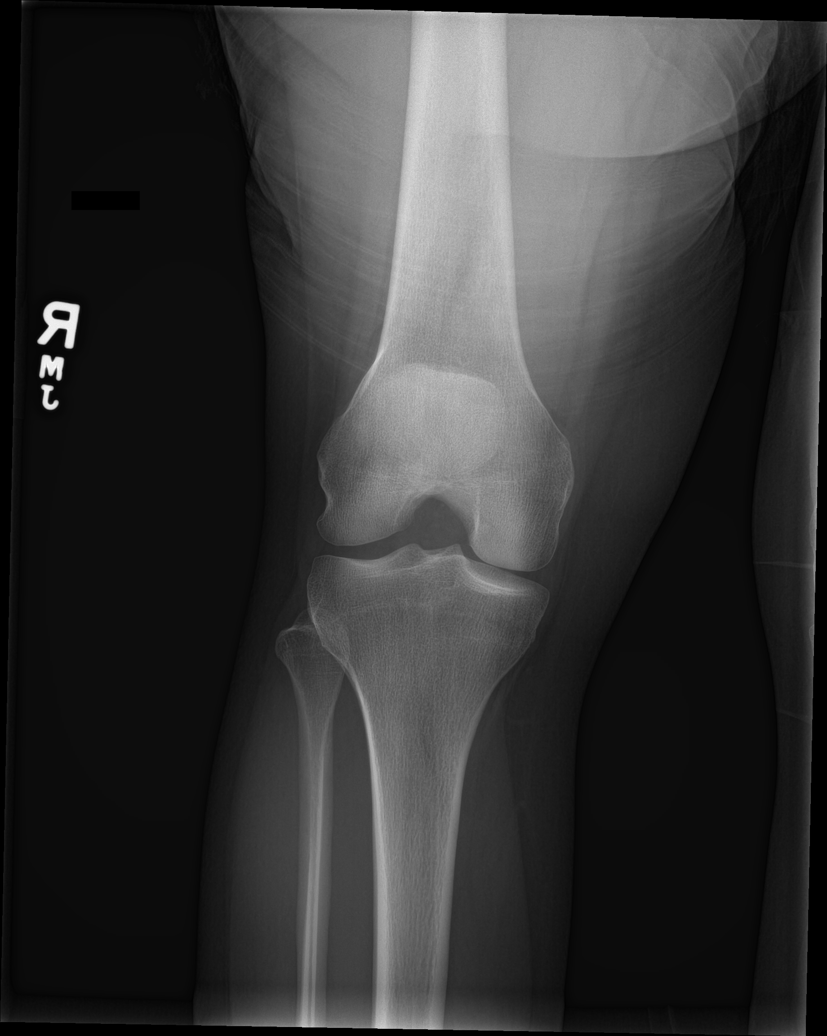

[patella skyline]
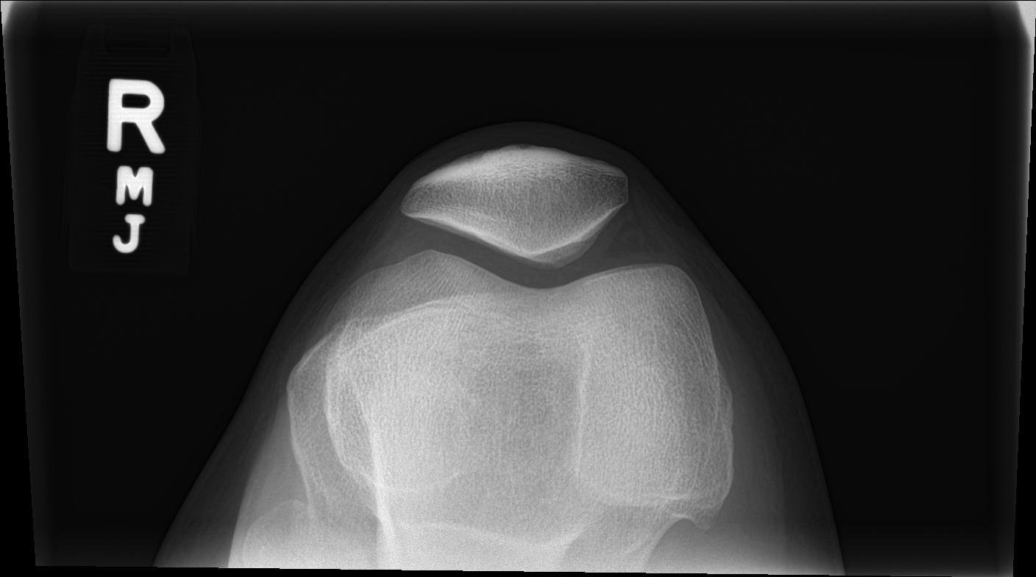

[knee lat (2 of 2)]
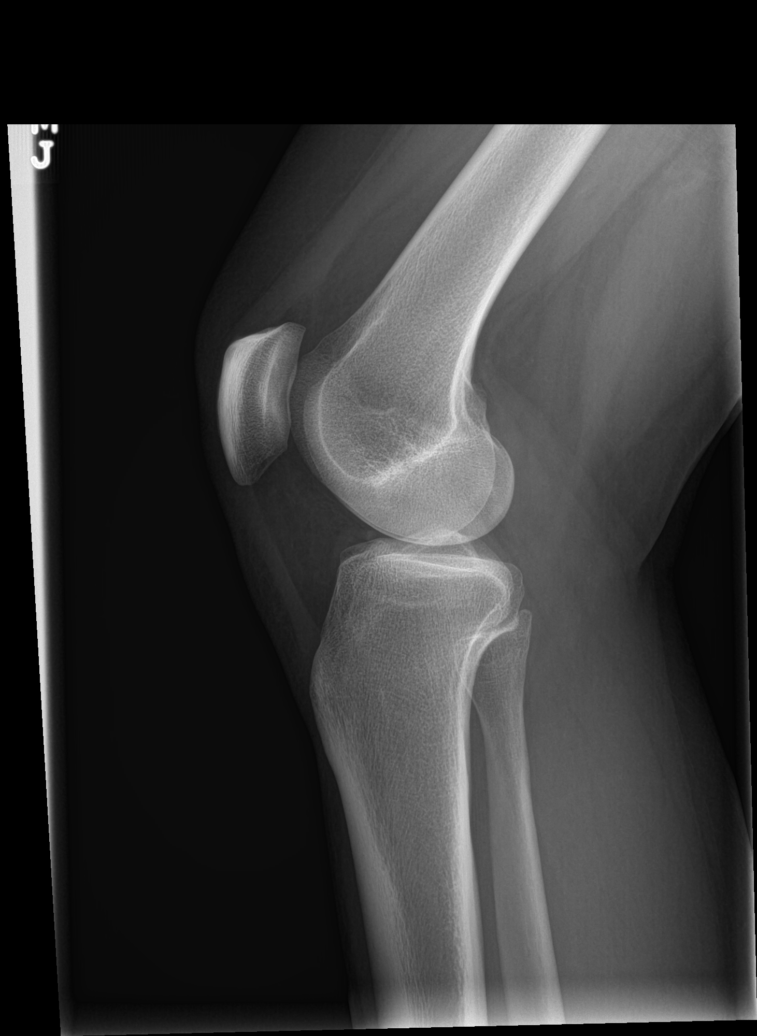

[5 of 5 positions shown; findings below may reference images not displayed]

FINDINGS: No evidence of fracture, dislocation, or joint effusion. No evidence
of arthropathy or other focal bone abnormality. Soft tissues are
unremarkable.
IMPRESSION: No fracture or dislocation is seen in the right knee.

## 2023-12-30 ENCOUNTER — Ambulatory Visit (INDEPENDENT_AMBULATORY_CARE_PROVIDER_SITE_OTHER)

## 2023-12-30 ENCOUNTER — Other Ambulatory Visit (HOSPITAL_COMMUNITY)
Admission: RE | Admit: 2023-12-30 | Discharge: 2023-12-30 | Disposition: A | Discharge status: Home / Self Care | Source: Ambulatory Visit | Attending: Obstetrics and Gynecology | Admitting: Obstetrics and Gynecology

## 2023-12-30 VITALS — BP 116/66 | HR 72 | Ht 62.0 in | Wt 134.5 lb

## 2023-12-30 DIAGNOSIS — Z113 Encounter for screening for infections with a predominantly sexual mode of transmission: Secondary | ICD-10-CM | POA: Diagnosis present

## 2023-12-30 NOTE — Progress Notes (Signed)
    NURSE VISIT NOTE  Subjective:    Patient ID: Carmen Black, female    DOB: 2000/10/20, 23 y.o.   MRN: [id number]  HPI  Patient is a 23 y.o. G0P0000 female who presents for test of cure for Neisseria Gonorrhea, Chlamydia and Bacterial Vaginitis (gardnerella) Candida Vaginitis.  Denies abnormal vaginal bleeding or significant pelvic pain or fever. denies dysuria, hematuria, urinary frequency, and urinary urgency.  She admits to pelvic and back pain, but she has a cyst that has caused pain previously. Patient admits to history of known exposure to STD.   Objective:    BP 116/66   Pulse 72   Ht 5\' 2"  (1.575 m)   Wt 134 lb 8 oz (61 kg)   BMI 24.60 kg/m    @THIS  VISIT ONLY@  Assessment:   1. Screen for STD (sexually transmitted disease)     bacterial vaginosis, herpes genitalis, trichomonas, rule out GC or chlamydia, and nonspecific vaginitis  Plan:   GC and chlamydia DNA  probe sent to lab. Treatment: abstain from coitus during course of treatment ROV prn if symptoms persist or worsen.    Santiago Bumpers, CMA [facility]

## 2023-12-31 LAB — HIV ANTIBODY (ROUTINE TESTING W REFLEX): HIV Screen 4th Generation wRfx: NONREACTIVE

## 2023-12-31 LAB — HSV 1 AND 2 AB, IGG
HSV 1 Glycoprotein G Ab, IgG: NONREACTIVE
HSV 2 IgG, Type Spec: NONREACTIVE

## 2023-12-31 LAB — HEPATITIS C ANTIBODY: Hep C Virus Ab: NONREACTIVE

## 2023-12-31 LAB — HEPATITIS B SURFACE ANTIGEN: Hepatitis B Surface Ag: NEGATIVE

## 2023-12-31 LAB — RPR: RPR Ser Ql: NONREACTIVE

## 2024-01-01 LAB — CERVICOVAGINAL ANCILLARY ONLY
Bacterial Vaginitis (gardnerella): POSITIVE — AB
Candida Glabrata: NEGATIVE
Candida Vaginitis: POSITIVE — AB
Chlamydia: NEGATIVE
Comment: NEGATIVE
Comment: NEGATIVE
Comment: NEGATIVE
Comment: NEGATIVE
Comment: NEGATIVE
Comment: NORMAL
Neisseria Gonorrhea: NEGATIVE
Trichomonas: NEGATIVE

## 2024-01-03 ENCOUNTER — Other Ambulatory Visit: Payer: Self-pay

## 2024-01-03 DIAGNOSIS — B9689 Other specified bacterial agents as the cause of diseases classified elsewhere: Secondary | ICD-10-CM

## 2024-01-03 DIAGNOSIS — B379 Candidiasis, unspecified: Secondary | ICD-10-CM

## 2024-01-03 MED ORDER — FLUCONAZOLE 150 MG PO TABS: 150.0000 mg | ORAL_TABLET | Freq: Once | ORAL | 0 refills | Status: AC

## 2024-01-03 MED ORDER — METRONIDAZOLE 500 MG PO TABS: 500.0000 mg | ORAL_TABLET | Freq: Two times a day (BID) | ORAL | 0 refills | Status: DC

## 2024-01-06 ENCOUNTER — Ambulatory Visit (LOCAL_COMMUNITY_HEALTH_CENTER): Payer: Self-pay

## 2024-01-06 DIAGNOSIS — Z111 Encounter for screening for respiratory tuberculosis: Secondary | ICD-10-CM

## 2024-01-09 ENCOUNTER — Ambulatory Visit (LOCAL_COMMUNITY_HEALTH_CENTER)

## 2024-01-09 LAB — TB SKIN TEST
Induration: 0 mm
TB Skin Test: NEGATIVE

## 2024-01-15 ENCOUNTER — Ambulatory Visit

## 2024-02-05 ENCOUNTER — Ambulatory Visit
Admission: EM | Admit: 2024-02-05 | Discharge: 2024-02-05 | Disposition: A | Discharge status: Home / Self Care | Attending: Emergency Medicine | Admitting: Emergency Medicine

## 2024-02-05 DIAGNOSIS — N764 Abscess of vulva: Secondary | ICD-10-CM | POA: Diagnosis not present

## 2024-02-05 MED ORDER — SULFAMETHOXAZOLE-TRIMETHOPRIM 800-160 MG PO TABS: 1.0000 | ORAL_TABLET | Freq: Two times a day (BID) | ORAL | 0 refills | Status: AC
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
## 2024-02-05 NOTE — Discharge Instructions (Addendum)
 Take the Bactrim  as directed.  Use warm compresses as directed.  Follow-up with your primary care provider if your symptoms are not improving.

## 2024-02-05 NOTE — ED Triage Notes (Addendum)
 Patient to Urgent Care with complaints of an abscess present to the left side of buttocks.   Symptoms x2-3 days. No drainage. Feels as though the area is larger. Using warm compresses.  Denies any fevers.

## 2024-02-11 ENCOUNTER — Ambulatory Visit: Admitting: Certified Nurse Midwife

## 2024-02-18 ENCOUNTER — Ambulatory Visit

## 2024-02-21 ENCOUNTER — Encounter: Payer: Self-pay | Admitting: Emergency Medicine

## 2024-02-21 ENCOUNTER — Ambulatory Visit
Admission: EM | Admit: 2024-02-21 | Discharge: 2024-02-21 | Disposition: A | Discharge status: Home / Self Care | Attending: Emergency Medicine | Admitting: Emergency Medicine

## 2024-02-21 DIAGNOSIS — Z3202 Encounter for pregnancy test, result negative: Secondary | ICD-10-CM

## 2024-02-21 HISTORY — DX: Encounter for pregnancy test, result negative: Z32.02

## 2024-02-21 LAB — PREGNANCY, URINE: Preg Test, Ur: NEGATIVE

## 2024-02-21 NOTE — ED Triage Notes (Signed)
 Patient was told that she would not be able to get pregnant due to having POC and ovarian cysts.  Patient states that she took a home pregnancy test yesterday and today and was positive.

## 2024-02-21 NOTE — Discharge Instructions (Addendum)
 Pregnancy test is negative
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
## 2024-02-22 ENCOUNTER — Emergency Department
Admission: EM | Admit: 2024-02-22 | Discharge: 2024-02-23 | Disposition: A | Discharge status: Home / Self Care | Attending: Emergency Medicine | Admitting: Emergency Medicine

## 2024-02-22 ENCOUNTER — Other Ambulatory Visit: Payer: Self-pay

## 2024-02-22 DIAGNOSIS — Z3490 Encounter for supervision of normal pregnancy, unspecified, unspecified trimester: Secondary | ICD-10-CM

## 2024-02-22 DIAGNOSIS — N83291 Other ovarian cyst, right side: Secondary | ICD-10-CM | POA: Diagnosis not present

## 2024-02-22 DIAGNOSIS — N83201 Unspecified ovarian cyst, right side: Secondary | ICD-10-CM

## 2024-02-22 DIAGNOSIS — Z3491 Encounter for supervision of normal pregnancy, unspecified, first trimester: Secondary | ICD-10-CM | POA: Diagnosis not present

## 2024-02-22 DIAGNOSIS — R102 Pelvic and perineal pain: Secondary | ICD-10-CM | POA: Diagnosis present

## 2024-02-22 LAB — URINALYSIS, ROUTINE W REFLEX MICROSCOPIC
Bilirubin Urine: NEGATIVE
Glucose, UA: NEGATIVE mg/dL
Hgb urine dipstick: NEGATIVE
Ketones, ur: 5 mg/dL — AB
Leukocytes,Ua: NEGATIVE
Nitrite: NEGATIVE
Protein, ur: NEGATIVE mg/dL
Specific Gravity, Urine: 1.026 (ref 1.005–1.030)
pH: 5 (ref 5.0–8.0)

## 2024-02-22 LAB — CBC
HCT: 40 % (ref 36.0–46.0)
Hemoglobin: 13.7 g/dL (ref 12.0–15.0)
MCH: 31 pg (ref 26.0–34.0)
MCHC: 34.3 g/dL (ref 30.0–36.0)
MCV: 90.5 fL (ref 80.0–100.0)
Platelets: 259 10*3/uL (ref 150–400)
RBC: 4.42 MIL/uL (ref 3.87–5.11)
RDW: 11.8 % (ref 11.5–15.5)
WBC: 8.6 10*3/uL (ref 4.0–10.5)
nRBC: 0 % (ref 0.0–0.2)

## 2024-02-22 LAB — POC URINE PREG, ED: Preg Test, Ur: POSITIVE — AB

## 2024-02-22 NOTE — ED Triage Notes (Addendum)
 Pt to ed from home via POV for abd pain. Pt was just seen yesterday for same and was told she was not pregnant but went home and took multiple pregnancy tests and was positive, Pt is caox4, in no acute distress and ambulatory in triage. Pt has HX of cysts on her ovaries and PCOS and is wondering if one of them has ruptured as to why she is in pain.

## 2024-02-23 ENCOUNTER — Emergency Department

## 2024-02-23 LAB — COMPREHENSIVE METABOLIC PANEL WITH GFR
ALT: 16 U/L (ref 0–44)
AST: 17 U/L (ref 15–41)
Albumin: 4.3 g/dL (ref 3.5–5.0)
Alkaline Phosphatase: 42 U/L (ref 38–126)
Anion gap: 10 (ref 5–15)
BUN: 14 mg/dL (ref 6–20)
CO2: 22 mmol/L (ref 22–32)
Calcium: 9.1 mg/dL (ref 8.9–10.3)
Chloride: 104 mmol/L (ref 98–111)
Creatinine, Ser: 0.68 mg/dL (ref 0.44–1.00)
GFR, Estimated: >60 mL/min (ref >60)
Glucose, Bld: 95 mg/dL (ref 70–99)
Potassium: 3.5 mmol/L (ref 3.5–5.1)
Sodium: 136 mmol/L (ref 135–145)
Total Bilirubin: 0.6 mg/dL (ref 0.0–1.2)
Total Protein: 7 g/dL (ref 6.5–8.1)

## 2024-02-23 LAB — HCG, QUANTITATIVE, PREGNANCY: hCG, Beta Chain, Quant, S: 88 m[IU]/mL — ABNORMAL HIGH (ref <5)

## 2024-02-23 LAB — LIPASE, BLOOD: Lipase: 40 U/L (ref 11–51)

## 2024-02-23 NOTE — ED Provider Notes (Signed)
 [facility] Provider Note    Event Date/Time   First MD Initiated Contact with Patient 02/22/24 2340     (approximate)   History   Abdominal Cramping   HPI Carmen Black is a 23 y.o. female  who presents for evaluation of intermittent sharp LLQ pain for several days in the setting of a history of PCOS and multiple positive home pregnancy tests.  She was diagnosed with PCOS about 4-5 years ago and has had large ovarian cysts in the past.  No birth control for months.  Relatively recently started having menstrual cycles again after being on Depo for years.  Had unprotected sex with long-term partner within the last month since last period.  Had some spotty bleeding a couple of weeks ago.  Now is late for her period.  Multiple + pregnancy tests at home, but went to UC several days ago and urine test was negative.  Still having intermittent LLQ pain and had 6 more + pregnancy tests at home, so came to the ED for clarification.  No pain at this time.  Denies fever, N/V, CP, SOB, dysuria.      Physical Exam   Triage Vital Signs: ED Triage Vitals  Encounter Vitals Group     BP 02/22/24 2333 (!) 129/99     Systolic BP Percentile --      Diastolic BP Percentile --      Pulse Rate 02/22/24 2333 99     Resp 02/22/24 2333 16     Temp 02/22/24 2333 98.7 F (37.1 C)     Temp Source 02/22/24 2333 Oral     SpO2 02/22/24 2333 99 %     Weight 02/22/24 2334 62 kg (136 lb 11 oz)     Height 02/22/24 2334 1.575 m (5\' 2" )     Head Circumference --      Peak Flow --      Pain Score 02/22/24 2334 7     Pain Loc --      Pain Education --      Exclude from Growth Chart --     Most recent vital signs: Vitals:   02/22/24 2333  BP: (!) 129/99  Pulse: 99  Resp: 16  Temp: 98.7 F (37.1 C)  SpO2: 99%    General: Awake, no distress.  CV:  Good peripheral perfusion. Regular rate/rhythm. Resp:  Normal effort. Speaking easily and comfortably, no accessory muscle usage  nor intercostal retractions.   Abd:  No distention. No tenderness to palpation of the abdomen.  Patient reports some tenderness in per left inguinal region, with no palpable hernia nor other abnormality.   ED Results / Procedures / Treatments   Labs (all labs ordered are listed, but only abnormal results are displayed) Labs Reviewed  URINALYSIS, ROUTINE W REFLEX MICROSCOPIC - Abnormal; Notable for the following components:      Result Value   Color, Urine YELLOW (*)    APPearance CLOUDY (*)    Ketones, ur 5 (*)    All other components within normal limits  HCG, QUANTITATIVE, PREGNANCY - Abnormal; Notable for the following components:   hCG, Beta Chain, Quant, S 88 (*)    All other components within normal limits  POC URINE PREG, ED - Abnormal; Notable for the following components:   Preg Test, Ur POSITIVE (*)    All other components within normal limits  LIPASE, BLOOD  COMPREHENSIVE METABOLIC PANEL WITH GFR  CBC     RADIOLOGY  See ED course for details   PROCEDURES:  Critical Care performed: No  Procedures    IMPRESSION / MDM / ASSESSMENT AND PLAN / ED COURSE  I reviewed the triage vital signs and the nursing notes.                              Differential diagnosis includes, but is not limited to, early pregnancy, ectopic, ovarian cyst, hemorrhagic cyst, PCOS, STD/PID/TOA.  Patient's presentation is most consistent with acute presentation with potential threat to life or bodily function.  Labs/studies ordered: U-preg, CMP, lipase, cbc, HCG, UA, pelvic/transvaginal U/S  Interventions/Medications given:  Medications - No data to display  (Note:  hospital course my include additional interventions and/or labs/studies not listed above.)   Essentially normal vitals, negative UA.  Positive U. Preg but hCG is only 88.  CMP, CBC, lipase all normal.  Patient is in no pain at this time and has no tenderness to palpation of the abdomen.  Given the very low hCG, I  counseled the patient that it is very unlikely anything specific will be identified on the ultrasound, but I think it is reasonable to proceed with ultrasound including Doppler to evaluate for the possibility of ectopic pregnancy, ovarian cyst, torsion, etc.  Patient agrees with the plan.  Will reassess after results are back.   Clinical Course as of 02/23/24 0228  Sun Feb 23, 2024  0222 US  OB LESS THAN 14 WEEKS W/ OB TRANSVAGINAL AND DOPPLER I viewed and interpreted the patient's ultrasound and I can see no evidence of intrauterine pregnancy.  The radiologist confirmed no evidence of intrauterine pregnancy including no gestational sac.  The radiologist identified a complex right ovarian cyst but no evidence of torsion.  I reassessed the patient and she is in no pain or distress at this time.  She is comfortable with the plan for outpatient follow-up with her OB/GYN with whom she has an appointment in 3 to 4 days.  I gave my usual and customary follow-up recommendations and return precautions. [CF]    Clinical Course User Index [CF] Lynnda Sas, MD     FINAL CLINICAL IMPRESSION(S) / ED DIAGNOSES   Final diagnoses:  Pelvic pain  Right ovarian cyst  Early stage of pregnancy     Rx / DC Orders   ED Discharge Orders     None        Note:  This document was prepared using Dragon voice recognition software and may include unintentional dictation errors.   Lynnda Sas, MD 02/23/24 [phone]

## 2024-02-23 NOTE — Discharge Instructions (Addendum)
 As we discussed, your evaluation was generally reassuring.  Your urine pregnancy test was positive and your beta-hCG (blood pregnancy test) was also positive, but it was only 88 tonight, which indicates very early pregnancy, possibly within the 1st or 2nd week.  It is too early to see anything on the ultrasound.  You have an Varian cyst on the right which may be chronic and likely will resolve in a few weeks; the radiologist recommended a follow-up pelvic ultrasound in 6 to 12 weeks to see if the cyst has resolved.  You should follow-up as scheduled with your OB/GYN for a repeat beta-hCG level and consider having a repeat pelvic ultrasound as recommended by your OB/GYN to track the development of your pregnancy.  Follow-up is recommended and  return to the emergency department if you develop new or worsening symptoms that concern you.

## 2024-02-25 ENCOUNTER — Ambulatory Visit

## 2024-02-26 ENCOUNTER — Encounter: Payer: Self-pay | Admitting: Obstetrics and Gynecology

## 2024-02-26 ENCOUNTER — Ambulatory Visit

## 2024-02-26 ENCOUNTER — Ambulatory Visit (INDEPENDENT_AMBULATORY_CARE_PROVIDER_SITE_OTHER): Admitting: Obstetrics and Gynecology

## 2024-02-26 VITALS — BP 138/86 | HR 98 | Wt 136.0 lb

## 2024-02-26 DIAGNOSIS — Z3A01 Less than 8 weeks gestation of pregnancy: Secondary | ICD-10-CM | POA: Diagnosis not present

## 2024-02-26 DIAGNOSIS — O3680X0 Pregnancy with inconclusive fetal viability, not applicable or unspecified: Secondary | ICD-10-CM | POA: Diagnosis not present

## 2024-02-26 DIAGNOSIS — N83201 Unspecified ovarian cyst, right side: Secondary | ICD-10-CM

## 2024-02-26 DIAGNOSIS — Z3400 Encounter for supervision of normal first pregnancy, unspecified trimester: Secondary | ICD-10-CM

## 2024-02-26 MED ORDER — COMPLETENATE 29-1 MG PO CHEW: 1.0000 | CHEWABLE_TABLET | Freq: Every day | ORAL | Status: DC

## 2024-02-26 NOTE — Progress Notes (Signed)
 Patient presents today to discuss recent ED visit resulting in an ovarian cyst and pregnancy confirmation. She has complaints of left sided pelvic pain at this time, believes she had a cyst rupture recently. Additional questions regarding medications for pain that are safe in pregnancy. Prenatals sent to pharmacy and dating ultrasound ordered.
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
## 2024-02-26 NOTE — Progress Notes (Deleted)
    NURSE VISIT NOTE  Subjective:    Patient ID: CLARANN HELVEY, female    DOB: 2001-04-06, 23 y.o.   MRN: [id number]  HPI  Patient is a 23 y.o. G0P0000 female who presents for evaluation of amenorrhea. She believes she could be pregnant. {pregnancy desire:14500::"Pregnancy is desired."} Sexual Activity: {sexual partners:705}. Current symptoms also include: {preg sx:18128}. Last period was {norm/abn:16337}.    Objective:    LMP 01/28/2024 (Exact Date)   Lab Review  No results found for any visits on 02/26/24.  Assessment:   No diagnosis found.  Plan:   {AOB + PREGNANCY PLAN:28596:p}  {AOB - PREGNANCY PLAN:28597:p}   Juanita Norlander, RN

## 2024-03-10 ENCOUNTER — Other Ambulatory Visit

## 2024-03-11 ENCOUNTER — Ambulatory Visit
Admission: EM | Admit: 2024-03-11 | Discharge: 2024-03-11 | Disposition: A | Discharge status: Home / Self Care | Attending: Emergency Medicine | Admitting: Emergency Medicine

## 2024-03-11 ENCOUNTER — Ambulatory Visit

## 2024-03-11 DIAGNOSIS — A084 Viral intestinal infection, unspecified: Secondary | ICD-10-CM

## 2024-03-11 MED ORDER — DOXYLAMINE-PYRIDOXINE 10-10 MG PO TBEC: DELAYED_RELEASE_TABLET | ORAL | 1 refills | Status: DC

## 2024-03-11 MED ORDER — AMOXICILLIN 500 MG PO CAPS
500.0000 mg | ORAL_CAPSULE | Freq: Two times a day (BID) | ORAL | 0 refills | Status: AC
Start: 2024-03-11 — End: 2024-03-18

## 2024-03-11 MED ORDER — LOPERAMIDE HCL 2 MG PO CAPS: 2.0000 mg | ORAL_CAPSULE | Freq: Four times a day (QID) | ORAL | 0 refills | Status: DC | PRN
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
## 2024-03-11 NOTE — ED Triage Notes (Signed)
 Patient presents to UC for diarrhea and vomiting since Monday. States she is pregnant so has not taking anything for her symptoms. Unable to keep down fluids.

## 2024-03-11 NOTE — Discharge Instructions (Signed)
 Your symptoms are most likely caused by a virus, it will work its way out your system over the next few days  Take amoxicillin  twice daily for 7 days empirically to ensure symptoms do not worsen   May use doxylamine as needed for nausea, follow instructions on bottle  You can use Imodium to help with diarrhea, and be mindful over use of this medication may cause opposite effect constipation  You can use over-the-counter ibuprofen  or Tylenol , which ever you have at home, to help manage fevers  Continue to promote hydration throughout the day by using electrolyte replacement solution such as Gatorade, body armor, Pedialyte, which ever you have at home  Try eating bland foods such as bread, rice, toast, fruit which are easier on the stomach to digest, avoid foods that are overly spicy, overly seasoned or greasy

## 2024-03-18 ENCOUNTER — Ambulatory Visit
Admission: RE | Admit: 2024-03-18 | Discharge: 2024-03-18 | Disposition: A | Discharge status: Home / Self Care | Source: Ambulatory Visit | Attending: Obstetrics and Gynecology | Admitting: Obstetrics and Gynecology

## 2024-03-18 DIAGNOSIS — N83201 Unspecified ovarian cyst, right side: Secondary | ICD-10-CM | POA: Diagnosis present

## 2024-03-18 DIAGNOSIS — O3680X0 Pregnancy with inconclusive fetal viability, not applicable or unspecified: Secondary | ICD-10-CM | POA: Insufficient documentation

## 2024-03-24 ENCOUNTER — Telehealth: Payer: Self-pay

## 2024-03-24 DIAGNOSIS — O219 Vomiting of pregnancy, unspecified: Secondary | ICD-10-CM

## 2024-03-24 MED ORDER — ONDANSETRON 4 MG PO TBDP: 4.0000 mg | ORAL_TABLET | Freq: Three times a day (TID) | ORAL | 0 refills | Status: DC | PRN

## 2024-03-24 NOTE — Telephone Encounter (Signed)
 Received call on triage line from patient who reports ongoing nausea and vomiting at [redacted] weeks pregnant.  She has tried Vitamin B6/Unisom  without relief.  She reports being seen at urgent care who prescribed Diclegis , but her insurance company won't cover it.  I asked if she had ever tried Phenergan  and she says no, but she has previously been using ondansetron  and it has worked well and her insurance covers it, so she would prefer an rx for ondansetron  instead.  Rx sent to [facility] on [address] in [place], per pt request.

## 2024-03-24 NOTE — Progress Notes (Deleted)
 OBSTETRIC INITIAL PRENATAL VISIT  Subjective:    Carmen Black is being seen today for her first obstetrical visit.  This {is/is not:9024} a planned pregnancy. She is a 23 y.o. G1P0000 female at [redacted]w[redacted]d gestation, Estimated Date of Delivery: 10/31/24 with Patient's last menstrual period was 01/25/2024.,  consistent with *** week sono. Her obstetrical history is significant for {ob risk factors:10154}. Relationship with FOB: {fob:16621}. Patient {does/does not:19097} intend to breast feed. Pregnancy history fully reviewed.    OB History  Gravida Para Term Preterm AB Living  1 0 0 0 0 0  SAB IAB Ectopic Multiple Live Births  0 0 0 0 0    # Outcome Date GA Lbr Len/2nd Weight Sex Type Anes PTL Lv  1 Current             Gynecologic History:  Last pap smear was ***.  Results were {Normal/Abnormal:304960160}.  {Actions; denies-reports:120008} h/o abnormal pap smears in the past.  {Actions; denies-reports:120008} history of STIs.  Contraception prior to conception:    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HCC)    Depression    Headache     Family History  Problem Relation Age of Onset   Diabetes Mother    Hypertension Mother    Bipolar disorder Mother    Deep vein thrombosis Mother    Migraines Mother    Seizures Mother    Cervical cancer Mother        67s   Colon cancer Father    Schizophrenia Paternal Aunt    Clotting disorder Paternal Aunt        blood clots in legs   Heart disease Maternal Grandmother    Hepatitis Maternal Grandmother    Clotting disorder Maternal Grandmother        blood clots in legs   Depression Half-Brother    Depression Half-Sister    Kidney disease Maternal Aunt    Kidney disease Paternal Uncle    Hepatitis Paternal Grandmother    Heart disease Paternal Grandmother     Past Surgical History:  Procedure Laterality Date   NO PAST SURGERIES       Social History   Socioeconomic History   Marital status: Single    Spouse name: Not on file   Number of children: Not on file   Years of education: Not on file   Highest education level: Not on file  Occupational History   Not on file  Tobacco Use   Smoking status: Some Days    Types: Cigars   Smokeless tobacco: Never  Vaping Use   Vaping status: Some Days   Substances: Nicotine, Flavoring  Substance and Sexual Activity   Alcohol use: Yes    Comment: social   Drug use: Yes    Frequency: 1.0 times per week    Types: Marijuana    Comment: last used new years 2023   Sexual activity: Yes    Partners: Male, Female    Birth control/protection: None  Other Topics Concern   Not on file  Social History Narrative   Not on file   Social Drivers of Health   Financial Resource Strain: Low Risk  (06/21/2023)   Received from [facility]   Overall Financial Resource Strain (CARDIA)    Difficulty of Paying Living Expenses: Not hard at all  Food Insecurity: No Food Insecurity (06/21/2023)   Received from [facility]  Health Care   Hunger Vital Sign    Within the past 12 months, you worried that your food would run out before you got the money to buy more.: Never true    Within the past 12 months, the food you bought just didn't last and you didn't have money to get more.: Never true  Transportation Needs: No Transportation Needs (06/21/2023)   Received from [facility] - Transportation    Lack of Transportation (Medical): No    Lack of Transportation (Non-Medical): No  Physical Activity: Not on file  Stress: No Stress Concern Present (06/21/2023)   Received from [facility] - Occupational Stress Questionnaire    Feeling of Stress : Not at all  Social Connections: Not on file  Intimate Partner Violence: Not At Risk (06/21/2023)   Received from [facility]   Humiliation, Afraid, Rape, and Kick questionnaire    Within  the last year, have you been afraid of your partner or ex-partner?: No    Within the last year, have you been humiliated or emotionally abused in other ways by your partner or ex-partner?: No    Within the last year, have you been kicked, hit, slapped, or otherwise physically hurt by your partner or ex-partner?: No    Within the last year, have you been raped or forced to have any kind of sexual activity by your partner or ex-partner?: No    Current Outpatient Medications on File Prior to Visit  Medication Sig Dispense Refill   cetirizine  (ZYRTEC ) 10 MG tablet Take 1 tablet (10 mg total) by mouth daily as needed for allergies. (Patient not taking: Reported on 02/26/2024) 30 tablet 11   Doxylamine -Pyridoxine  10-10 MG TBEC Two tablets at bedtime on day 1 and 2; if symptoms persist, take 1 tablet in morning and 2 tablets at bedtime on day 3; if symptoms persist, may increase to 1 tablet in morning, 1 tablet mid-afternoon, and 2 tablets at bedtime on day 4 (maximum: doxylamine  40 mg/pyridoxine  40 mg 60 tablet 1   loperamide  (IMODIUM ) 2 MG capsule Take 1 capsule (2 mg total) by mouth 4 (four) times daily as needed for diarrhea or loose stools. 12 capsule 0   sertraline  (ZOLOFT ) 50 MG tablet Take 1 tablet (50 mg total) by mouth daily. 90 tablet 2   tiZANidine  (ZANAFLEX ) 4 MG tablet Take 1 tablet (4 mg total) by mouth every 6 (six) hours as needed for muscle spasms. (Patient not taking: Reported on 02/26/2024) 30 tablet 0   [DISCONTINUED] norelgestromin-ethinyl estradiol  (XULANE ) 150-35 MCG/24HR transdermal patch Place 1 patch onto the skin once a week. Leave patch off on week 4 for 1 week (Patient not taking: Reported on 11/12/2019) 3 patch 12   Current Facility-Administered Medications on File Prior to Visit  Medication Dose Route Frequency Provider Last Rate Last Admin   prenatal vitamin w/FE, FA (NATACHEW) chewable tablet 1 tablet  1 tablet Oral Q1200 Janit Alm Agent, MD        No Known  Allergies   Review of Systems General: Not Present- Fever, Weight Loss and Weight Gain. Skin: Not Present- Rash. HEENT: Not Present- Blurred Vision, Headache and Bleeding Gums. Respiratory: Not Present- Difficulty Breathing. Breast: Not Present- Breast Mass. Cardiovascular: Not Present- Chest Pain, Elevated Blood Pressure, Fainting / Blacking Out and Shortness of Breath. Gastrointestinal: Not Present- Abdominal Pain, Constipation, Nausea and Vomiting. Female Genitourinary: Not Present- Frequency, Painful Urination, Pelvic Pain, Vaginal  Bleeding, Vaginal Discharge, Contractions, regular, Fetal Movements Decreased, Urinary Complaints and Vaginal Fluid. Musculoskeletal: Not Present- Back Pain and Leg Cramps. Neurological: Not Present- Dizziness. Psychiatric: Not Present- Depression.     Objective:   Last menstrual period 01/25/2024.  There is no height or weight on file to calculate BMI.  General Appearance:    Alert, cooperative, no distress, appears stated age  Head:    Normocephalic, without obvious abnormality, atraumatic  Eyes:    PERRL, conjunctiva/corneas clear, EOM's intact, both eyes  Ears:    Normal external ear canals, both ears  Nose:   Nares normal, septum midline, mucosa normal, no drainage or sinus tenderness  Throat:   Lips, mucosa, and tongue normal; teeth and gums normal  Neck:   Supple, symmetrical, trachea midline, no adenopathy; thyroid: no enlargement/tenderness/nodules; no carotid bruit or JVD  Back:     Symmetric, no curvature, ROM normal, no CVA tenderness  Lungs:     Clear to auscultation bilaterally, respirations unlabored  Chest Wall:    No tenderness or deformity   Heart:    Regular rate and rhythm, S1 and S2 normal, no murmur, rub or gallop  Breast Exam:    No tenderness, masses, or nipple abnormality  Abdomen:     Soft, non-tender, bowel sounds active all four quadrants, no masses, no organomegaly.  FHT ***  bpm.  Genitalia:    Pelvic:external genitalia  normal, vagina without lesions, discharge, or tenderness, rectovaginal septum  normal. Cervix normal in appearance, no cervical motion tenderness, no adnexal masses or tenderness.  Pregnancy positive findings: uterine enlargement: *** wk size, nontender.   Rectal:    Normal external sphincter.  No hemorrhoids appreciated. Internal exam not done.   Extremities:   Extremities normal, atraumatic, no cyanosis or edema  Pulses:   2+ and symmetric all extremities  Skin:   Skin color, texture, turgor normal, no rashes or lesions  Lymph nodes:   Cervical, supraclavicular, and axillary nodes normal  Neurologic:   CNII-XII intact, normal strength, sensation and reflexes throughout     Assessment:   1. Initial obstetric visit in first trimester   2. Genetic screening     Plan:   Supervision of ***normal/high risk pregnancy  - Initial labs reviewed. - Prenatal vitamins encouraged. - Problem list reviewed and updated. - New OB counseling:  The patient has been given an overview regarding routine prenatal care.  Recommendations regarding diet, weight gain, and exercise in pregnancy were given. - Prenatal testing, optional genetic testing, and ultrasound use in pregnancy were reviewed.  Traditional genetic screening vs cell-fee DNA genetic screening discussed, including risks and benefits. Testing {requests/ordered/declines:14581}. - Benefits of Breast Feeding were discussed. The patient is encouraged to consider nursing her baby post partum.  1. Initial obstetric visit in first trimester (Primary) ***  2. Genetic screening ***     Follow up in 4 weeks.    Jakie Raisin, CMA [facility]

## 2024-03-25 ENCOUNTER — Other Ambulatory Visit (HOSPITAL_COMMUNITY)
Admission: RE | Admit: 2024-03-25 | Discharge: 2024-03-25 | Disposition: A | Discharge status: Home / Self Care | Source: Ambulatory Visit | Attending: Obstetrics | Admitting: Obstetrics

## 2024-03-25 ENCOUNTER — Encounter: Admitting: Obstetrics

## 2024-03-25 ENCOUNTER — Ambulatory Visit (INDEPENDENT_AMBULATORY_CARE_PROVIDER_SITE_OTHER)

## 2024-03-25 VITALS — BP 111/77 | HR 77

## 2024-03-25 DIAGNOSIS — Z3A01 Less than 8 weeks gestation of pregnancy: Secondary | ICD-10-CM

## 2024-03-25 DIAGNOSIS — N898 Other specified noninflammatory disorders of vagina: Secondary | ICD-10-CM

## 2024-03-25 DIAGNOSIS — O26891 Other specified pregnancy related conditions, first trimester: Secondary | ICD-10-CM

## 2024-03-25 DIAGNOSIS — Z1379 Encounter for other screening for genetic and chromosomal anomalies: Secondary | ICD-10-CM

## 2024-03-25 DIAGNOSIS — Z3491 Encounter for supervision of normal pregnancy, unspecified, first trimester: Secondary | ICD-10-CM

## 2024-03-25 NOTE — Progress Notes (Signed)
    NURSE VISIT NOTE  Subjective:    Patient ID: Carmen Black, female    DOB: Apr 11, 2001, 23 y.o.   MRN: [id number]  HPI  Patient is a 23 y.o. G34P0000 female who presents for white and creamy vaginal discharge since May 2025.Denies abnormal vaginal bleeding or significant pelvic pain or fever.  Patient has history of known exposure to STD.   Objective:    BP 111/77 (BP Location: Left Arm, Patient Position: Sitting, Cuff Size: Normal)   Pulse 77   LMP 01/25/2024      Assessment:   1. Vaginal discharge during pregnancy in first trimester       Plan:   GC and chlamydia DNA  probe sent to lab. Treatment: abstain from coitus during course of treatment ROV prn if symptoms persist or worsen.   Burnard LITTIE Ro, CMA

## 2024-03-26 LAB — CERVICOVAGINAL ANCILLARY ONLY
Bacterial Vaginitis (gardnerella): POSITIVE — AB
Candida Glabrata: NEGATIVE
Candida Vaginitis: POSITIVE — AB
Chlamydia: NEGATIVE
Comment: NEGATIVE
Comment: NEGATIVE
Comment: NEGATIVE
Comment: NEGATIVE
Comment: NEGATIVE
Comment: NORMAL
Neisseria Gonorrhea: NEGATIVE
Trichomonas: NEGATIVE

## 2024-03-27 ENCOUNTER — Ambulatory Visit: Payer: Self-pay

## 2024-03-27 DIAGNOSIS — B9689 Other specified bacterial agents as the cause of diseases classified elsewhere: Secondary | ICD-10-CM

## 2024-03-27 MED ORDER — METRONIDAZOLE 500 MG PO TABS: 500.0000 mg | ORAL_TABLET | Freq: Two times a day (BID) | ORAL | 0 refills | Status: DC

## 2024-03-30 ENCOUNTER — Telehealth (INDEPENDENT_AMBULATORY_CARE_PROVIDER_SITE_OTHER): Payer: Self-pay

## 2024-03-30 DIAGNOSIS — Z3689 Encounter for other specified antenatal screening: Secondary | ICD-10-CM

## 2024-03-30 DIAGNOSIS — Z3480 Encounter for supervision of other normal pregnancy, unspecified trimester: Secondary | ICD-10-CM | POA: Insufficient documentation

## 2024-03-30 MED ORDER — PRENATAL PLUS VITAMIN/MINERAL 27-1 MG PO TABS: 1.0000 | ORAL_TABLET | Freq: Every day | ORAL | 3 refills | Status: DC
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
## 2024-03-31 ENCOUNTER — Other Ambulatory Visit

## 2024-04-07 ENCOUNTER — Encounter: Payer: Self-pay | Admitting: Emergency Medicine

## 2024-04-07 ENCOUNTER — Other Ambulatory Visit: Payer: Self-pay

## 2024-04-07 ENCOUNTER — Emergency Department: Payer: Self-pay

## 2024-04-07 ENCOUNTER — Emergency Department
Admission: EM | Admit: 2024-04-07 | Discharge: 2024-04-07 | Disposition: A | Discharge status: Home / Self Care | Payer: MEDICAID | Attending: Emergency Medicine | Admitting: Emergency Medicine

## 2024-04-07 DIAGNOSIS — M25552 Pain in left hip: Secondary | ICD-10-CM | POA: Diagnosis not present

## 2024-04-07 DIAGNOSIS — O26891 Other specified pregnancy related conditions, first trimester: Secondary | ICD-10-CM | POA: Insufficient documentation

## 2024-04-07 DIAGNOSIS — M546 Pain in thoracic spine: Secondary | ICD-10-CM | POA: Diagnosis not present

## 2024-04-07 DIAGNOSIS — Z3A10 10 weeks gestation of pregnancy: Secondary | ICD-10-CM | POA: Insufficient documentation

## 2024-04-07 LAB — CBC WITH DIFFERENTIAL/PLATELET
Abs Immature Granulocytes: 0.01 K/uL (ref 0.00–0.07)
Basophils Absolute: 0.1 K/uL (ref 0.0–0.1)
Basophils Relative: 1 %
Eosinophils Absolute: 0.1 K/uL (ref 0.0–0.5)
Eosinophils Relative: 1 %
HCT: 37.3 % (ref 36.0–46.0)
Hemoglobin: 12.8 g/dL (ref 12.0–15.0)
Immature Granulocytes: 0 %
Lymphocytes Relative: 33 %
Lymphs Abs: 2.3 K/uL (ref 0.7–4.0)
MCH: 31.2 pg (ref 26.0–34.0)
MCHC: 34.3 g/dL (ref 30.0–36.0)
MCV: 91 fL (ref 80.0–100.0)
Monocytes Absolute: 0.8 K/uL (ref 0.1–1.0)
Monocytes Relative: 12 %
Neutro Abs: 3.6 K/uL (ref 1.7–7.7)
Neutrophils Relative %: 53 %
Platelets: 251 K/uL (ref 150–400)
RBC: 4.1 MIL/uL (ref 3.87–5.11)
RDW: 11.9 % (ref 11.5–15.5)
WBC: 6.8 K/uL (ref 4.0–10.5)
nRBC: 0 % (ref 0.0–0.2)

## 2024-04-07 LAB — URINALYSIS, ROUTINE W REFLEX MICROSCOPIC
Bilirubin Urine: NEGATIVE
Glucose, UA: NEGATIVE mg/dL
Hgb urine dipstick: NEGATIVE
Ketones, ur: 5 mg/dL — AB
Nitrite: NEGATIVE
Protein, ur: NEGATIVE mg/dL
Specific Gravity, Urine: 1.02 (ref 1.005–1.030)
pH: 5 (ref 5.0–8.0)

## 2024-04-07 LAB — COMPREHENSIVE METABOLIC PANEL WITH GFR
ALT: 15 U/L (ref 0–44)
AST: 18 U/L (ref 15–41)
Albumin: 3.7 g/dL (ref 3.5–5.0)
Alkaline Phosphatase: 31 U/L — ABNORMAL LOW (ref 38–126)
Anion gap: 9 (ref 5–15)
BUN: 8 mg/dL (ref 6–20)
CO2: 22 mmol/L (ref 22–32)
Calcium: 9 mg/dL (ref 8.9–10.3)
Chloride: 104 mmol/L (ref 98–111)
Creatinine, Ser: 0.51 mg/dL (ref 0.44–1.00)
GFR, Estimated: >60 mL/min (ref >60)
Glucose, Bld: 97 mg/dL (ref 70–99)
Potassium: 3.7 mmol/L (ref 3.5–5.1)
Sodium: 135 mmol/L (ref 135–145)
Total Bilirubin: 0.6 mg/dL (ref 0.0–1.2)
Total Protein: 6.4 g/dL — ABNORMAL LOW (ref 6.5–8.1)

## 2024-04-07 LAB — POC URINE PREG, ED: Preg Test, Ur: POSITIVE — AB

## 2024-04-07 LAB — HCG, QUANTITATIVE, PREGNANCY: hCG, Beta Chain, Quant, S: 139732 m[IU]/mL — ABNORMAL HIGH (ref <5)

## 2024-04-07 MED ORDER — POLYETHYLENE GLYCOL 3350 17 GM/SCOOP PO POWD: 238.0000 g | Freq: Once | ORAL | 0 refills | Status: DC

## 2024-04-07 MED ORDER — CYCLOBENZAPRINE HCL 5 MG PO TABS: 5.0000 mg | ORAL_TABLET | Freq: Three times a day (TID) | ORAL | 0 refills | Status: DC | PRN

## 2024-04-07 MED ORDER — LIDOCAINE 5 % EX PTCH: 1.0000 | MEDICATED_PATCH | CUTANEOUS | 0 refills | Status: AC

## 2024-04-07 MED ORDER — POLYETHYLENE GLYCOL 3350 17 GM/SCOOP PO POWD
238.0000 g | Freq: Once | ORAL | 0 refills | Status: AC
Start: 2024-04-07 — End: 2024-04-07

## 2024-04-07 MED ORDER — LIDOCAINE 5 % EX PTCH
1.0000 | MEDICATED_PATCH | CUTANEOUS | Status: DC
  Administered 2024-04-07: 1 via TRANSDERMAL
  Filled 2024-04-07: qty 1

## 2024-04-07 MED ORDER — LIDOCAINE 5 % EX PTCH: 1.0000 | MEDICATED_PATCH | CUTANEOUS | 0 refills | Status: DC

## 2024-04-07 NOTE — ED Provider Notes (Signed)
 [facility] Provider Note   Event Date/Time   First MD Initiated Contact with Patient 04/07/24 1521     (approximate) History  Back Pain and Hip Pain  HPI Carmen Black is a 23 y.o. female with a past medical history of of 10-week pregnancy who presents complaining of of lower thoracic bilateral paraspinal back pain as well as left inguinal hip pain.  Patient states that these pains have been present over the last few days and have not been controlled with heating pads and Tylenol .  Patient endorses worsening pain with any movement or coughing and denies any relieving factors.  Patient states that she is normally prescribed baclofen  however was told that she cannot take it due to pregnancy.  Patient denies any recent falls or other trauma.  Patient denies any abnormal vaginal discharge.  ROS: Patient currently denies any vision changes, tinnitus, difficulty speaking, facial droop, sore throat, chest pain, shortness of breath, abdominal pain, nausea/vomiting/diarrhea, dysuria, or weakness/numbness/paresthesias in any extremity   Physical Exam  Triage Vital Signs: ED Triage Vitals  Encounter Vitals Group     BP 04/07/24 1452 (!) 131/91     Girls Systolic BP Percentile --      Girls Diastolic BP Percentile --      Boys Systolic BP Percentile --      Boys Diastolic BP Percentile --      Pulse Rate 04/07/24 1452 82     Resp 04/07/24 1452 18     Temp 04/07/24 1452 98.7 F (37.1 C)     Temp Source 04/07/24 1452 Oral     SpO2 04/07/24 1452 100 %     Weight 04/07/24 1515 136 lb 11 oz (62 kg)     Height 04/07/24 1453 5' 2 (1.575 m)     Head Circumference --      Peak Flow --      Pain Score 04/07/24 1453 8     Pain Loc --      Pain Education --      Exclude from Growth Chart --    Most recent vital signs: Vitals:   04/07/24 1452  BP: (!) 131/91  Pulse: 82  Resp: 18  Temp: 98.7 F (37.1 C)  SpO2: 100%   General: Awake, oriented x4. CV:  Good peripheral  perfusion. Resp:  Normal effort. Abd:  No distention. Other:  Young adult well-developed, well-nourished Caucasian female resting comfortably in no acute distress.  Tenderness to palpation bilateral lower thoracic paraspinal musculature with associated spasm.  Tenderness to palpation in the left inguinal region of the hip that is worse with passive range of motion ED Results / Procedures / Treatments  Labs (all labs ordered are listed, but only abnormal results are displayed) Labs Reviewed  COMPREHENSIVE METABOLIC PANEL WITH GFR - Abnormal; Notable for the following components:      Result Value   Total Protein 6.4 (*)    Alkaline Phosphatase 31 (*)    All other components within normal limits  URINALYSIS, ROUTINE W REFLEX MICROSCOPIC - Abnormal; Notable for the following components:   Color, Urine YELLOW (*)    APPearance CLOUDY (*)    Ketones, ur 5 (*)    Leukocytes,Ua SMALL (*)    Bacteria, UA FEW (*)    All other components within normal limits  HCG, QUANTITATIVE, PREGNANCY - Abnormal; Notable for the following components:   hCG, Beta Denis Ina, [place] [phone] (*)    All other components within normal  limits  POC URINE PREG, ED - Abnormal; Notable for the following components:   Preg Test, Ur POSITIVE (*)    All other components within normal limits  CBC WITH DIFFERENTIAL/PLATELET  RADIOLOGY ED MD interpretation: OB ultrasound independently interpreted and shows a single viable intrauterine pregnancy at approximately 10 weeks and 1 day - All radiology independently interpreted and agree with radiology assessment Official radiology report(s): US  OB Comp Less 14 Wks Result Date: 04/07/2024 CLINICAL DATA:  Left pelvic pain x3 days. EXAM: OBSTETRIC <14 WK ULTRASOUND TECHNIQUE: Transabdominal ultrasound was performed for evaluation of the gestation as well as the maternal uterus and adnexal regions. COMPARISON:  March 18, 2024 FINDINGS: Intrauterine gestational sac: Single Yolk sac:   Visualized. Embryo:  Visualized. Cardiac Activity: Visualized. Heart Rate: 167 bpm CRL:   32.5 mm   10 w 1 d                  US  EDC: November 02, 2024 Subchorionic hemorrhage:  None visualized. Maternal uterus/adnexae: The right ovary measures 4.1 cm x 1.4 cm x 2.4 cm and is normal in appearance. The left ovary measures 3.2 cm x 1.7 cm x 2.8 cm and is normal in appearance. No pelvic free fluid is seen. IMPRESSION: Single, viable intrauterine pregnancy at approximately 10 weeks and 1 day gestation by ultrasound evaluation. Electronically Signed   By: Suzen Dials M.D.   On: 04/07/2024 16:40   PROCEDURES: Critical Care performed: No Procedures MEDICATIONS ORDERED IN ED: Medications  lidocaine  (LIDODERM ) 5 % 1 patch (1 patch Transdermal Patch Applied 04/07/24 1716)   IMPRESSION / MDM / ASSESSMENT AND PLAN / ED COURSE  I reviewed the triage vital signs and the nursing notes.                             The patient is on the cardiac monitor to evaluate for evidence of arrhythmia and/or significant heart rate changes. Patient's presentation is most consistent with acute presentation with potential threat to life or bodily function. Patient presents for low back pain. Given History and Exam the patient appears to be at low risk for Spinal Cord Compression Syndrome, Vertebral Malignancy/Mets, acute Spinal Fracture, Vertebral Osteomyelitis, Epidural Abscess, Infected or Obstructing Kidney Stone.  Their presentation appears most likely to be secondary to non-emergent musculoskeletal etiology vs non-emergent disc herniation.  ED Workup: Defer imaging and labwork for outpatient follow up at this time. Rx: Lidocaine  patch, cyclobenzaprine .  Discussed with patient at length the Class B recommendation of cyclobenzaprine  during pregnancy and she agrees to a trial of this medication Disposition: Discharge. Strict return precautions discussed with patient with full understanding. Advised patient to follow up  promptly with primary care provider   FINAL CLINICAL IMPRESSION(S) / ED DIAGNOSES   Final diagnoses:  Acute bilateral thoracic back pain  Left hip pain   Rx / DC Orders   ED Discharge Orders          Ordered    polyethylene glycol powder (GLYCOLAX /MIRALAX ) 17 GM/SCOOP powder   Once,   Status:  Discontinued        04/07/24 1651    cyclobenzaprine  (FLEXERIL ) 5 MG tablet  3 times daily PRN,   Status:  Discontinued        04/07/24 1651    lidocaine  (LIDODERM ) 5 %  Every 24 hours,   Status:  Discontinued        04/07/24 1651    cyclobenzaprine  (  FLEXERIL ) 5 MG tablet  3 times daily PRN        04/07/24 1727    lidocaine  (LIDODERM ) 5 %  Every 24 hours        04/07/24 1727    polyethylene glycol powder (GLYCOLAX /MIRALAX ) 17 GM/SCOOP powder   Once        04/07/24 1727           Note:  This document was prepared using Dragon voice recognition software and may include unintentional dictation errors.   Jossie Artist POUR, MD 04/07/24 [phone]

## 2024-04-07 NOTE — ED Notes (Signed)
 Patient declined discharge vital signs.

## 2024-04-07 NOTE — ED Triage Notes (Addendum)
 Pt c/o upper back pain that worsens when she breathes. Pt normally takes baclofen  for her muscles spasms, but is unable to take it due to pregnancy. Pt took tylenol  and has been using a heating pad without relief. Pt also endorses L side pelvic pain with increased white, creamy discharge. Pt [redacted] weeks gestation. G1 P0. Pt has not had first ultrasound yet.
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
## 2024-04-15 ENCOUNTER — Encounter: Payer: Self-pay | Admitting: Obstetrics

## 2024-04-15 ENCOUNTER — Other Ambulatory Visit (HOSPITAL_COMMUNITY)
Admission: RE | Admit: 2024-04-15 | Discharge: 2024-04-15 | Disposition: A | Discharge status: Home / Self Care | Payer: Self-pay | Source: Ambulatory Visit | Attending: Obstetrics | Admitting: Obstetrics

## 2024-04-15 ENCOUNTER — Ambulatory Visit (INDEPENDENT_AMBULATORY_CARE_PROVIDER_SITE_OTHER): Payer: Self-pay | Admitting: Obstetrics

## 2024-04-15 VITALS — BP 136/73 | HR 88 | Ht 62.0 in | Wt 132.5 lb

## 2024-04-15 DIAGNOSIS — Z3491 Encounter for supervision of normal pregnancy, unspecified, first trimester: Secondary | ICD-10-CM | POA: Diagnosis not present

## 2024-04-15 DIAGNOSIS — Z113 Encounter for screening for infections with a predominantly sexual mode of transmission: Secondary | ICD-10-CM

## 2024-04-15 DIAGNOSIS — Z3A11 11 weeks gestation of pregnancy: Secondary | ICD-10-CM

## 2024-04-15 DIAGNOSIS — Z1379 Encounter for other screening for genetic and chromosomal anomalies: Secondary | ICD-10-CM

## 2024-04-15 DIAGNOSIS — Z131 Encounter for screening for diabetes mellitus: Secondary | ICD-10-CM

## 2024-04-15 DIAGNOSIS — Z124 Encounter for screening for malignant neoplasm of cervix: Secondary | ICD-10-CM

## 2024-04-15 MED ORDER — ASPIRIN 81 MG PO TBEC: 81.0000 mg | DELAYED_RELEASE_TABLET | Freq: Every day | ORAL | 2 refills | Status: DC

## 2024-04-15 NOTE — Patient Instructions (Signed)

## 2024-04-16 LAB — URINALYSIS, ROUTINE W REFLEX MICROSCOPIC
Bilirubin, UA: NEGATIVE
Glucose, UA: NEGATIVE
Ketones, UA: NEGATIVE
Leukocytes,UA: NEGATIVE
Nitrite, UA: NEGATIVE
Protein,UA: NEGATIVE
RBC, UA: NEGATIVE
Specific Gravity, UA: 1.018 (ref 1.005–1.030)
Urobilinogen, Ur: 0.2 mg/dL (ref 0.2–1.0)
pH, UA: 6 (ref 5.0–7.5)

## 2024-04-16 LAB — HEMOGLOBIN A1C
Est. average glucose Bld gHb Est-mCnc: 100 mg/dL
Hgb A1c MFr Bld: 5.1 % (ref 4.8–5.6)

## 2024-04-16 LAB — CBC/D/PLT+RPR+RH+ABO+RUBIGG...
Antibody Screen: NEGATIVE
Basophils Absolute: 0.1 x10E3/uL (ref 0.0–0.2)
Basos: 1 %
EOS (ABSOLUTE): 0.1 x10E3/uL (ref 0.0–0.4)
Eos: 2 %
HCV Ab: NONREACTIVE
HIV Screen 4th Generation wRfx: NONREACTIVE
Hematocrit: 37.2 % (ref 34.0–46.6)
Hemoglobin: 11.9 g/dL (ref 11.1–15.9)
Hepatitis B Surface Ag: NEGATIVE
Immature Grans (Abs): 0 x10E3/uL (ref 0.0–0.1)
Immature Granulocytes: 0 %
Lymphocytes Absolute: 2.2 x10E3/uL (ref 0.7–3.1)
Lymphs: 26 %
MCH: 31 pg (ref 26.6–33.0)
MCHC: 32 g/dL (ref 31.5–35.7)
MCV: 97 fL (ref 79–97)
Monocytes Absolute: 1 x10E3/uL — ABNORMAL HIGH (ref 0.1–0.9)
Monocytes: 11 %
Neutrophils Absolute: 5.3 x10E3/uL (ref 1.4–7.0)
Neutrophils: 60 %
Platelets: 252 x10E3/uL (ref 150–450)
RBC: 3.84 x10E6/uL (ref 3.77–5.28)
RDW: 12.4 % (ref 11.7–15.4)
RPR Ser Ql: NONREACTIVE
Rh Factor: POSITIVE
Rubella Antibodies, IGG: 1.19 {index} (ref >0.99)
Varicella zoster IgG: NONREACTIVE
WBC: 8.7 x10E3/uL (ref 3.4–10.8)

## 2024-04-16 LAB — HCV INTERPRETATION

## 2024-04-17 LAB — CERVICOVAGINAL ANCILLARY ONLY
Chlamydia: NEGATIVE
Comment: NEGATIVE
Comment: NORMAL
Neisseria Gonorrhea: NEGATIVE

## 2024-04-17 LAB — URINE CULTURE, OB REFLEX

## 2024-04-17 LAB — CULTURE, OB URINE

## 2024-04-18 LAB — MONITOR DRUG PROFILE 14(MW)
Amphetamine Scrn, Ur: NEGATIVE ng/mL
BARBITURATE SCREEN URINE: NEGATIVE ng/mL
BENZODIAZEPINE SCREEN, URINE: NEGATIVE ng/mL
Buprenorphine, Urine: NEGATIVE ng/mL
Cocaine (Metab) Scrn, Ur: NEGATIVE ng/mL
Creatinine(Crt), U: 115.1 mg/dL (ref 20.0–300.0)
Fentanyl, Urine: NEGATIVE pg/mL
Meperidine Screen, Urine: NEGATIVE ng/mL
Methadone Screen, Urine: NEGATIVE ng/mL
OXYCODONE+OXYMORPHONE UR QL SCN: NEGATIVE ng/mL
Opiate Scrn, Ur: NEGATIVE ng/mL
Ph of Urine: 5.5 (ref 4.5–8.9)
Phencyclidine Qn, Ur: NEGATIVE ng/mL
Propoxyphene Scrn, Ur: NEGATIVE ng/mL
SPECIFIC GRAVITY: 1.02
Tramadol Screen, Urine: NEGATIVE ng/mL

## 2024-04-18 LAB — CANNABINOID (GC/MS), URINE
Cannabinoid: POSITIVE — AB
Carboxy THC (GC/MS): 438 ng/mL

## 2024-04-19 LAB — MATERNIT 21 PLUS CORE, BLOOD
Fetal Fraction: 28
Result (T21): NEGATIVE
Trisomy 13 (Patau syndrome): NEGATIVE
Trisomy 18 (Edwards syndrome): NEGATIVE
Trisomy 21 (Down syndrome): NEGATIVE

## 2024-04-21 ENCOUNTER — Encounter: Admitting: Certified Nurse Midwife

## 2024-04-22 LAB — NICOTINE SCREEN, URINE: Cotinine Ql Scrn, Ur: NEGATIVE ng/mL

## 2024-05-09 ENCOUNTER — Ambulatory Visit
Admission: EM | Admit: 2024-05-09 | Discharge: 2024-05-09 | Disposition: A | Discharge status: Home / Self Care | Attending: Emergency Medicine | Admitting: Emergency Medicine

## 2024-05-09 ENCOUNTER — Encounter: Payer: Self-pay | Admitting: Emergency Medicine

## 2024-05-09 DIAGNOSIS — G43001 Migraine without aura, not intractable, with status migrainosus: Secondary | ICD-10-CM

## 2024-05-09 MED ORDER — SODIUM CHLORIDE 0.9 % IV BOLUS
1000.0000 mL | Freq: Once | INTRAVENOUS | Status: AC
  Administered 2024-05-09: 1000 mL via INTRAVENOUS

## 2024-05-09 MED ORDER — METOCLOPRAMIDE HCL 10 MG PO TABS: 10.0000 mg | ORAL_TABLET | Freq: Four times a day (QID) | ORAL | 0 refills | Status: DC

## 2024-05-09 MED ORDER — ONDANSETRON 4 MG PO TBDP
4.0000 mg | ORAL_TABLET | Freq: Once | ORAL | Status: AC
  Administered 2024-05-09: 4 mg via ORAL

## 2024-05-09 MED ORDER — METOCLOPRAMIDE HCL 5 MG/ML IJ SOLN
10.0000 mg | Freq: Once | INTRAMUSCULAR | Status: AC
  Administered 2024-05-09: 10 mg via INTRAVENOUS

## 2024-05-09 MED ORDER — DIPHENHYDRAMINE HCL 50 MG/ML IJ SOLN
50.0000 mg | Freq: Once | INTRAMUSCULAR | Status: AC
  Administered 2024-05-09: 50 mg via INTRAVENOUS

## 2024-05-09 NOTE — ED Triage Notes (Signed)
 Patient states she has been having pain behind her eyes and head pressure and pain that started 4 days ago.  Patient also reports some nausea.  Patient has taken Tylenol .  Patient reports blurry vision in her right eye.
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
## 2024-05-09 NOTE — Discharge Instructions (Addendum)
 We have treated you for migraine headache today and have given you Reglan  and Benadryl .  I have sent a prescription of Reglan  to the pharmacy and if you have any further episodes of migraine headache you may take 1 Reglan  tablet along with 2 Benadryl  tablets or capsules to try and abort your migraine headache.  I would recommend discussing other methods for prevention with your OB/GYN.  If you develop any sharp severe return of her headache, associate with nausea vomiting or you cannot keep down fluids or medications, dizziness, fainting, change in vision, numbness or weakness in any of your extremities you need to call 911 and go to the ER.

## 2024-05-13 ENCOUNTER — Ambulatory Visit (INDEPENDENT_AMBULATORY_CARE_PROVIDER_SITE_OTHER): Admitting: Licensed Practical Nurse

## 2024-05-13 ENCOUNTER — Encounter: Payer: Self-pay | Admitting: Licensed Practical Nurse

## 2024-05-13 VITALS — BP 108/63 | HR 74 | Wt 130.0 lb

## 2024-05-13 DIAGNOSIS — Z3482 Encounter for supervision of other normal pregnancy, second trimester: Secondary | ICD-10-CM

## 2024-05-13 DIAGNOSIS — Z3A15 15 weeks gestation of pregnancy: Secondary | ICD-10-CM

## 2024-05-13 DIAGNOSIS — Z369 Encounter for antenatal screening, unspecified: Secondary | ICD-10-CM

## 2024-05-13 DIAGNOSIS — G43909 Migraine, unspecified, not intractable, without status migrainosus: Secondary | ICD-10-CM | POA: Insufficient documentation

## 2024-05-13 DIAGNOSIS — Z1379 Encounter for other screening for genetic and chromosomal anomalies: Secondary | ICD-10-CM | POA: Diagnosis not present

## 2024-05-13 DIAGNOSIS — Z3480 Encounter for supervision of other normal pregnancy, unspecified trimester: Secondary | ICD-10-CM

## 2024-05-13 MED ORDER — PRENATAL PLUS VITAMIN/MINERAL 27-1 MG PO TABS: 1.0000 | ORAL_TABLET | Freq: Every day | ORAL | 3 refills | Status: AC

## 2024-05-13 NOTE — Progress Notes (Signed)
    Return Prenatal Note   Subjective   23 y.o. G1P0000 at [redacted]w[redacted]d presents for this follow-up prenatal visit.  Patient doing ok. Has hx Migraines, has not uses Imitrex  since becoming pregnant. Reports she had a bad migraine from lat Wednesday to Saturday, it got so bad she had nusea, needed to leave work and lost her vision in one eye and it was dropping so she went to an urgent care where they gave her a migraine cocktail. Wonders if she has use Imitrex  if she has another Migraine. She has not seen a neurologist since about 2108. She believes the Imitrex  was prescribed in this office.  Patient reports: Movement: Absent Contractions: Not present  Objective   Flow sheet Vitals: Pulse Rate: 74 BP: 108/63 Fetal Heart Rate (bpm): 155 Total weight gain: -6 lb (-2.722 kg)  General Appearance  No acute distress, well appearing, and well nourished Pulmonary   Normal work of breathing Neurologic   Alert and oriented to person, place, and time Psychiatric   Mood and affect within normal limits  Assessment/Plan   Plan  23 y.o. G1P0000 at [redacted]w[redacted]d presents for follow-up OB visit. Reviewed prenatal record including previous visit note.  Supervision of other normal pregnancy, antepartum -TWG -3, appetite is improving, rec 25-35 lbs weight gain  -Anatomy US  ordered  -AFP collected  -warning signs reviewed   Migraines -may use Tylenol  with caffeine as first line treatment. Then NSAID until 24wks, may use Imitrex  if previous intervention did not resolve symptoms.  -Rec magnesium  oxide 400mg , Riboflavin 400mg  and Co E Q10 150mg  daily to avoid migraine      Orders Placed This Encounter  Procedures   US  OB Comp + 14 Wk    Standing Status:   Future    Expected Date:   06/13/2024    Expiration Date:   05/13/2025    Reason for Exam (SYMPTOM  OR DIAGNOSIS REQUIRED):   Needs anatomy u/s    Preferred Imaging Location?:   Internal             at [facility]   AFP, Serum, Open Spina Bifida    Is  patient insulin  dependent?:   No    Patient weight (lb.):   130 lb (59 kg)    Gestational Age (GA), weeks:   15    Date on which patient was at this GA:   05/09/2024    GA Calculation Method:   LMP    Number of fetuses:   1    Reason for screen:   OTHER   No follow-ups on file.   Future Appointments  Date Time Provider Department Center  06/15/2024  1:00 PM [facility] US  1 [facility] None  06/15/2024  2:35 PM Slaughterbeck, Damien, CNM [facility] None    For next visit:  continue with routine prenatal care     JINNIE HERO [facility], CNM  08/13/251:07 PM

## 2024-05-13 NOTE — Assessment & Plan Note (Addendum)
-  may use Tylenol  with caffeine as first line treatment. Then NSAID until 24wks, may use Imitrex  if previous intervention did not resolve symptoms.  -Rec magnesium  oxide 400mg , Riboflavin 400mg  and Co E Q10 150mg  daily to avoid migraine

## 2024-05-13 NOTE — Assessment & Plan Note (Addendum)
-  TWG -3, appetite is improving, rec 25-35 lbs weight gain  -Anatomy US  ordered  -AFP collected  -warning signs reviewed

## 2024-05-15 LAB — AFP, SERUM, OPEN SPINA BIFIDA
AFP MoM: 1.83
AFP Value: 63 ng/mL
Gest. Age on Collection Date: 15.6 wk
Maternal Age At EDD: 24.5 a
OSBR Risk 1 IN: 1190
Test Results:: NEGATIVE
Weight: 130 [lb_av]

## 2024-06-11 NOTE — Progress Notes (Signed)
    Return Prenatal Note   Subjective   23 y.o. G1P0000 at [redacted]w[redacted]d presents for this follow-up prenatal visit.  Patient is doing well. She has no new concerns today. Anatomy u/s done today. She can't get her nipple rings out and want to see if she can get help removing them today.  Patient reports: Movement: Present Contractions: Not present  Objective   Flow sheet Vitals: Pulse Rate: 91 BP: 115/77 Fundal Height: 20 cm Fetal Heart Rate (bpm): 150 Total weight gain: 2 lb 3.2 oz (0.998 kg)  General Appearance  No acute distress, well appearing, and well nourished Pulmonary   Normal work of breathing Neurologic   Alert and oriented to person, place, and time Psychiatric   Mood and affect within normal limits   Assessment/Plan   Plan  23 y.o. G1P0000 at [redacted]w[redacted]d presents for follow-up OB visit. Reviewed prenatal record including previous visit note.  Supervision of other normal pregnancy, antepartum Anatomy US  completed today. Able to loosen nipple rings for patient.  Reviewed red flag warning signs anticipatory guidance for upcoming prenatal care.       Future Appointments  Date Time Provider Department Center  07/13/2024 10:55 AM Christel Bai, Damien, CNM [facility] None    For next visit:  continue with routine prenatal care     Damien Parsley, CNM [facility]

## 2024-06-15 ENCOUNTER — Encounter: Payer: Self-pay | Admitting: Certified Nurse Midwife

## 2024-06-15 ENCOUNTER — Ambulatory Visit (INDEPENDENT_AMBULATORY_CARE_PROVIDER_SITE_OTHER): Admitting: Certified Nurse Midwife

## 2024-06-15 ENCOUNTER — Ambulatory Visit (INDEPENDENT_AMBULATORY_CARE_PROVIDER_SITE_OTHER)

## 2024-06-15 VITALS — BP 115/77 | HR 91 | Wt 138.2 lb

## 2024-06-15 DIAGNOSIS — Z369 Encounter for antenatal screening, unspecified: Secondary | ICD-10-CM | POA: Diagnosis not present

## 2024-06-15 DIAGNOSIS — Z3482 Encounter for supervision of other normal pregnancy, second trimester: Secondary | ICD-10-CM | POA: Diagnosis not present

## 2024-06-15 DIAGNOSIS — Z3A20 20 weeks gestation of pregnancy: Secondary | ICD-10-CM

## 2024-06-15 DIAGNOSIS — Z3480 Encounter for supervision of other normal pregnancy, unspecified trimester: Secondary | ICD-10-CM

## 2024-06-15 DIAGNOSIS — Z3A15 15 weeks gestation of pregnancy: Secondary | ICD-10-CM

## 2024-06-15 NOTE — Assessment & Plan Note (Signed)
 Anatomy US  completed today. Able to loosen nipple rings for patient.  Reviewed red flag warning signs anticipatory guidance for upcoming prenatal care.

## 2024-07-09 NOTE — Progress Notes (Signed)
    Return Prenatal Note   Subjective   23 y.o. G1P0000 at [redacted]w[redacted]d presents for this follow-up prenatal visit.  Patient some pain in her right buttocks.  Patient reports: Movement: Present Contractions: Not present  Objective   Flow sheet Vitals: Pulse Rate: 78 BP: 101/69 Fundal Height: 24 cm Fetal Heart Rate (bpm): 138 Total weight gain: 10 lb (4.536 kg)  General Appearance  No acute distress, well appearing, and well nourished Pulmonary   Normal work of breathing Neurologic   Alert and oriented to person, place, and time Psychiatric   Mood and affect within normal limits   Assessment/Plan   Plan  23 y.o. G1P0000 at [redacted]w[redacted]d presents for follow-up OB visit. Reviewed prenatal record including previous visit note.  Supervision of other normal pregnancy, antepartum Reviewed likely sciatica pain. Reviewed comfort measures and using chiropractic care.  Some improvement with nausea and vomiting but continues with 2-3 episodes per week. Reviewed upcoming 28 week labs Reviewed red flag warning signs anticipatory guidance for upcoming prenatal care.        Orders Placed This Encounter  Procedures   28 Week RH+Panel    Standing Status:   Future    Expected Date:   08/06/2024    Expiration Date:   11/04/2024   No follow-ups on file.   Future Appointments  Date Time Provider Department Center  08/10/2024  9:00 AM [facility] None  08/10/2024  9:35 AM Dominic, Jinnie Jansky, CNM [facility] None    For next visit:  ROB with 1 hour glucola, third trimester labs, and Tdap     Damien Parsley, CNM [facility]

## 2024-07-13 ENCOUNTER — Ambulatory Visit: Admitting: Certified Nurse Midwife

## 2024-07-13 VITALS — BP 101/69 | HR 78 | Wt 146.0 lb

## 2024-07-13 DIAGNOSIS — Z3A28 28 weeks gestation of pregnancy: Secondary | ICD-10-CM

## 2024-07-13 DIAGNOSIS — Z113 Encounter for screening for infections with a predominantly sexual mode of transmission: Secondary | ICD-10-CM

## 2024-07-13 DIAGNOSIS — Z3A23 23 weeks gestation of pregnancy: Secondary | ICD-10-CM | POA: Diagnosis not present

## 2024-07-13 DIAGNOSIS — Z130 Encounter for screening for diseases of the blood and blood-forming organs and certain disorders involving the immune mechanism: Secondary | ICD-10-CM

## 2024-07-13 DIAGNOSIS — Z3A24 24 weeks gestation of pregnancy: Secondary | ICD-10-CM

## 2024-07-13 DIAGNOSIS — Z3482 Encounter for supervision of other normal pregnancy, second trimester: Secondary | ICD-10-CM | POA: Diagnosis not present

## 2024-07-13 DIAGNOSIS — Z131 Encounter for screening for diabetes mellitus: Secondary | ICD-10-CM

## 2024-07-13 DIAGNOSIS — Z114 Encounter for screening for human immunodeficiency virus [HIV]: Secondary | ICD-10-CM

## 2024-07-13 DIAGNOSIS — Z3480 Encounter for supervision of other normal pregnancy, unspecified trimester: Secondary | ICD-10-CM

## 2024-07-13 NOTE — Assessment & Plan Note (Signed)
 Reviewed likely sciatica pain. Reviewed comfort measures and using chiropractic care.  Some improvement with nausea and vomiting but continues with 2-3 episodes per week. Reviewed upcoming 28 week labs Reviewed red flag warning signs anticipatory guidance for upcoming prenatal care.

## 2024-07-16 ENCOUNTER — Other Ambulatory Visit: Payer: Self-pay

## 2024-07-16 DIAGNOSIS — O219 Vomiting of pregnancy, unspecified: Secondary | ICD-10-CM

## 2024-07-16 MED ORDER — ONDANSETRON HCL 4 MG PO TABS: 4.0000 mg | ORAL_TABLET | Freq: Three times a day (TID) | ORAL | 0 refills | Status: DC | PRN

## 2024-07-26 ENCOUNTER — Ambulatory Visit
Admission: EM | Admit: 2024-07-26 | Discharge: 2024-07-26 | Disposition: A | Discharge status: Home / Self Care | Attending: Emergency Medicine | Admitting: Emergency Medicine

## 2024-07-26 ENCOUNTER — Encounter: Payer: Self-pay | Admitting: Emergency Medicine

## 2024-07-26 DIAGNOSIS — Z20822 Contact with and (suspected) exposure to covid-19: Secondary | ICD-10-CM | POA: Insufficient documentation

## 2024-07-26 DIAGNOSIS — J0140 Acute pansinusitis, unspecified: Secondary | ICD-10-CM | POA: Diagnosis not present

## 2024-07-26 DIAGNOSIS — R112 Nausea with vomiting, unspecified: Secondary | ICD-10-CM | POA: Diagnosis present

## 2024-07-26 LAB — RESP PANEL BY RT-PCR (FLU A&B, COVID) ARPGX2
Influenza A by PCR: NEGATIVE
Influenza B by PCR: NEGATIVE
SARS Coronavirus 2 by RT PCR: NEGATIVE

## 2024-07-26 LAB — GROUP A STREP BY PCR: Group A Strep by PCR: NOT DETECTED

## 2024-07-26 MED ORDER — PROMETHAZINE-DM 6.25-15 MG/5ML PO SYRP: 5.0000 mL | ORAL_SOLUTION | Freq: Four times a day (QID) | ORAL | 0 refills | Status: DC | PRN

## 2024-07-26 MED ORDER — ONDANSETRON 8 MG PO TBDP
8.0000 mg | ORAL_TABLET | Freq: Once | ORAL | Status: AC
  Administered 2024-07-26: 8 mg via ORAL

## 2024-07-26 MED ORDER — AMOXICILLIN-POT CLAVULANATE 875-125 MG PO TABS: 1.0000 | ORAL_TABLET | Freq: Two times a day (BID) | ORAL | 0 refills | Status: DC

## 2024-07-26 MED ORDER — FLUTICASONE PROPIONATE 50 MCG/ACT NA SUSP: 2.0000 | Freq: Every day | NASAL | 0 refills | Status: AC
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
## 2024-07-26 NOTE — ED Triage Notes (Signed)
 Patient reports cough, sore throat, and nasal congestion that started yesterday. Patient is [redacted] weeks gestation.  Patient unsure of fevers.

## 2024-07-26 NOTE — Discharge Instructions (Addendum)
 Strep, COVID, flu negative.  Start Mucinex to keep the mucous thin and to decongest you.   You may take  1000 mg of tylenol  up to 3-4 times a day as needed for pain. Most sinus infections are viral and do not need antibiotics unless you have a high fever, have had this for 10 days, or you get better and then get sick again. Use a NeilMed sinus rinse with distilled water as often as you want to to reduce nasal congestion. Follow the directions on the box.  Flonase  for nasal congestion, Promethazine  DM as needed for cough.  This may also help with nausea and vomiting.  Wait-and-see prescription of Augmentin  if you start to get worse over the next several days, start having facial swelling, upper dental pain.  Nausea/vomiting: Continue using the Zofran  prescribed to you as needed.  Push electrolyte containing fluids such as Pedialyte, liquid IV or Gatorade until your urine is clear.   Sore throat: Make sure you drink plenty of extra fluids.  Some people find salt water gargles  helpful. Take 5 mL of liquid Benadryl  and 5 mL of Maalox/Mylanta. Mix it together, and then hold it in your mouth for as long as you can and then swallow. You may do this 4 times a day.  Honey and lemon dissolved in hot water can also be soothing.   Go to www.goodrx.com  or www.costplusdrugs.com to look up your medications. This will give you a list of where you can find your prescriptions at the most affordable prices. Or ask the pharmacist what the cash price is, or if they have any other discount programs available to help make your medication more affordable. This can be less expensive than what you would pay with insurance.

## 2024-07-27 ENCOUNTER — Telehealth: Payer: Self-pay

## 2024-07-27 NOTE — Telephone Encounter (Signed)
 Carmen Black called triage stating she went to the urgent care yesterday and they gave her two prescriptions.  She is currently [redacted]w[redacted]d she wants to know if amoxicillin -clavulanate (AUGMENTIN ) 875-125 MG tablet and promethazine -dextromethorphan (PROMETHAZINE -DM) 6.25-15 MG/5ML syrup [[id number]] is safe to take during pregnancy?

## 2024-07-30 ENCOUNTER — Emergency Department
Admission: EM | Admit: 2024-07-30 | Discharge: 2024-07-31 | Disposition: A | Discharge status: Home / Self Care | Attending: Emergency Medicine | Admitting: Emergency Medicine

## 2024-07-30 ENCOUNTER — Other Ambulatory Visit: Payer: Self-pay

## 2024-07-30 ENCOUNTER — Encounter: Payer: Self-pay | Admitting: Emergency Medicine

## 2024-07-30 ENCOUNTER — Emergency Department

## 2024-07-30 DIAGNOSIS — R109 Unspecified abdominal pain: Secondary | ICD-10-CM | POA: Diagnosis not present

## 2024-07-30 DIAGNOSIS — R059 Cough, unspecified: Secondary | ICD-10-CM | POA: Diagnosis not present

## 2024-07-30 DIAGNOSIS — R0602 Shortness of breath: Secondary | ICD-10-CM | POA: Insufficient documentation

## 2024-07-30 DIAGNOSIS — Z3A28 28 weeks gestation of pregnancy: Secondary | ICD-10-CM | POA: Insufficient documentation

## 2024-07-30 DIAGNOSIS — O98512 Other viral diseases complicating pregnancy, second trimester: Secondary | ICD-10-CM | POA: Diagnosis not present

## 2024-07-30 DIAGNOSIS — B349 Viral infection, unspecified: Secondary | ICD-10-CM | POA: Insufficient documentation

## 2024-07-30 DIAGNOSIS — O26892 Other specified pregnancy related conditions, second trimester: Secondary | ICD-10-CM | POA: Diagnosis present

## 2024-07-30 DIAGNOSIS — O26899 Other specified pregnancy related conditions, unspecified trimester: Secondary | ICD-10-CM

## 2024-07-30 LAB — GROUP A STREP BY PCR: Group A Strep by PCR: NOT DETECTED

## 2024-07-30 LAB — URINALYSIS, ROUTINE W REFLEX MICROSCOPIC
Bilirubin Urine: NEGATIVE
Glucose, UA: NEGATIVE mg/dL
Hgb urine dipstick: NEGATIVE
Ketones, ur: 80 mg/dL — AB
Nitrite: NEGATIVE
Protein, ur: NEGATIVE mg/dL
Specific Gravity, Urine: 1.021 (ref 1.005–1.030)
pH: 6 (ref 5.0–8.0)

## 2024-07-30 LAB — POC URINE PREG, ED: Preg Test, Ur: POSITIVE — AB

## 2024-07-30 LAB — RESP PANEL BY RT-PCR (RSV, FLU A&B, COVID)  RVPGX2
Influenza A by PCR: NEGATIVE
Influenza B by PCR: NEGATIVE
Resp Syncytial Virus by PCR: NEGATIVE
SARS Coronavirus 2 by RT PCR: NEGATIVE

## 2024-07-30 MED ORDER — LACTATED RINGERS IV BOLUS
1000.0000 mL | Freq: Once | INTRAVENOUS | Status: AC
  Administered 2024-07-30: 1000 mL via INTRAVENOUS

## 2024-07-30 MED ORDER — ONDANSETRON HCL 4 MG/2ML IJ SOLN
4.0000 mg | Freq: Once | INTRAMUSCULAR | Status: AC
  Administered 2024-07-30: 4 mg via INTRAVENOUS
  Filled 2024-07-30: qty 2

## 2024-07-30 MED ORDER — IPRATROPIUM-ALBUTEROL 0.5-2.5 (3) MG/3ML IN SOLN
3.0000 mL | Freq: Once | RESPIRATORY_TRACT | Status: AC
  Administered 2024-07-30: 3 mL via RESPIRATORY_TRACT
  Filled 2024-07-30: qty 3

## 2024-07-30 NOTE — ED Triage Notes (Addendum)
 Pt arrives POV ambulatory to triage, gait steady, no acute distress noted c/o cough, nasal congestion and SOB that isn't getting better despite antibiotics. Pt reports one episode of emesis today. Pt is 26 wks 6d. Pt reports mild abd cramping that start today also 5/10. Denies vaginal d/c. FHT 144

## 2024-07-30 NOTE — ED Provider Notes (Signed)
 [facility] Provider Note    None    (approximate)   History   Shortness of Breath and Cough   HPI  Carmen Black is a 23 y.o. female  [redacted] weeks pregnant and as listed in EMR presents to the emergency department for treatment and evaluation of cough, congestion and shortness of breath.  She has been on antibiotics for about 5 days.  She has also had multiple episodes of vomiting today and has been a unable to tolerate any food or fluids.  She is now experiencing mild abdominal cramping since this afternoon.  She denies any vaginal bleeding or vaginal discharge.     Physical Exam    Vitals:   07/30/24 1933 07/30/24 2327  BP: (!) 126/90 121/71  Pulse: (!) 101 78  Resp: 18 19  Temp: 98.1 F (36.7 C) 98.5 F (36.9 C)  SpO2: 99% 100%    General: Awake, no distress.  CV:  Good peripheral perfusion.  Resp:  Normal effort.  Wheezing and rhonchi on expiration left upper lobe Abd:  No distention.  Other:     ED Results / Procedures / Treatments   Labs (all labs ordered are listed, but only abnormal results are displayed)  Labs Reviewed  POC URINE PREG, ED - Abnormal; Notable for the following components:      Result Value   Preg Test, Ur POSITIVE (*)    All other components within normal limits  RESP PANEL BY RT-PCR (RSV, FLU A&B, COVID)  [id number]  GROUP A STREP BY PCR  URINALYSIS, ROUTINE W REFLEX MICROSCOPIC     EKG  Not indicated   RADIOLOGY  Image and radiology report reviewed and interpreted by me. Radiology report consistent with the same.  Not indicated  PROCEDURES:  Critical Care performed: No  Procedures   MEDICATIONS ORDERED IN ED:  Medications  ipratropium-albuterol (DUONEB) 0.5-2.5 (3) MG/3ML nebulizer solution 3 mL (3 mLs Nebulization Given 07/30/24 2233)  lactated ringers bolus 1,000 mL (1,000 mLs Intravenous New Bag/Given 07/30/24 2231)  ondansetron  (ZOFRAN ) injection 4 mg (4 mg Intravenous Given 07/30/24  2233)     IMPRESSION / MDM / ASSESSMENT AND PLAN / ED COURSE   I have reviewed the triage note and vital signs. Vital signs are stable   Differential diagnosis includes, but is not limited to, bronchitis, viral syndrome, pneumonia, COVID, influenza, RSV.  Dehydration, contraction  Patient's presentation is most consistent with acute presentation with potential threat to life or bodily function.  23 year old female presenting to the emergency department for treatment and evaluation of congestion, cough, wheezing, nausea, vomiting that has been ongoing for the past few days.  See HPI for further details.  On exam, she is noted to have some rhonchi and wheezing noted in the left upper lobe.  DuoNeb ordered.  Also, she is experiencing nausea and vomiting for which she will be given IV fluids and Zofran .  Will be ultrasound ordered as well for the abdominal cramping.  She denies any vaginal bleeding or decrease in fetal movement.  Patient will be signed out to oncoming provider for follow-up on ultrasound.      FINAL CLINICAL IMPRESSION(S) / ED DIAGNOSES   Final diagnoses:  Abdominal cramping affecting pregnancy  Acute viral syndrome     Rx / DC Orders   ED Discharge Orders     None        Note:  This document was prepared using Dragon voice recognition software and may include  unintentional dictation errors.   Herlinda Kirk NOVAK, FNP 07/30/24 2344    Ernest Ronal BRAVO, MD 08/01/24 2306

## 2024-07-31 NOTE — ED Notes (Signed)
 Pt discharged to home, instructions and medications reviewed.  Pt verbalized understanding, no questions at this time.

## 2024-07-31 NOTE — ED Provider Notes (Signed)
-----------------------------------------   12:39 AM on 07/31/2024 -----------------------------------------  Assuming care from [facility].  In short, Carmen Black is a 23 y.o. female with a chief complaint of viral symptoms.  Refer to the original H&P for additional details.  The current plan of care is to reassess after OB ultrasound because the patient was having some abdominal pain as well.   Clinical Course as of 07/31/24 0151  Fri Jul 31, 2024  0147 I independently viewed and interpreted the patient's OB ultrasound and it is reassuring with pregnancy that they are dating at about 28 weeks, normal level of amniotic fluid, cardiac activity, etc.  I reviewed the rest of the patient's workup and it is also reassuring other than ketones in her urine for which she received a liter of LR.  She has some leukocytes and many bacteria but she is already taking Augmentin  and I will hold off on adding additional antibiotic coverage but I am ordering a urine culture. [CF]  0148 Patient is stable and when I reassessed her she has obvious upper respiratory viral symptoms.  Her lungs are clear to auscultation bilaterally and I did not observe a cough.  I reviewed her medical record and see that she was seen and diagnosed with viral sinusitis and given a wait and see prescription for antibiotics during her visit to the urgent care several days ago, but she confirmed that she did fill them immediately and has been taking them.  She was concerned that she is not getting better and she might need more antibiotics.  I reassured her that she has no signs or symptoms of pneumonia and that her symptoms are viral and the reason she is not getting better is because the antibiotics do not treat viral illness.  I encouraged her to hydrate herself and since she already took the antibiotic she should finish the course.  I encouraged her to follow-up as an outpatient.  She understands and agrees with the plan.  The  patient's medical screening exam is reassuring with no indication of an emergent medical condition requiring hospitalization or additional evaluation at this point.  The patient is safe and appropriate for discharge and outpatient follow up. [CF]    Clinical Course User Index [CF] Gordan Huxley, MD     Medications  ipratropium-albuterol (DUONEB) 0.5-2.5 (3) MG/3ML nebulizer solution 3 mL (3 mLs Nebulization Given 07/30/24 2233)  lactated ringers bolus 1,000 mL (0 mLs Intravenous Stopped 07/31/24 0111)  ondansetron  (ZOFRAN ) injection 4 mg (4 mg Intravenous Given 07/30/24 2233)     ED Discharge Orders     None      Final diagnoses:  Abdominal cramping affecting pregnancy  Acute viral syndrome     Gordan Huxley, MD 07/31/24 0151

## 2024-07-31 NOTE — Discharge Instructions (Signed)
 As we discussed, your evaluation was generally reassuring.  You were little bit dehydrated and so we encourage you to drink plenty of fluids including electrolyte solutions like Gatorade.  Since you started taking the antibiotics, you should complete the course, but the most likely reason you are not improving on the antibiotics is because you have a viral illness (a cold) that is making you feel ill and antibiotics do not fix viral conditions.  Please follow-up with your regular primary care or OB/GYN provider at the next available opportunity.  Insert return statement.

## 2024-08-01 LAB — URINE CULTURE: Culture: <10000 — AB

## 2024-08-03 ENCOUNTER — Ambulatory Visit (INDEPENDENT_AMBULATORY_CARE_PROVIDER_SITE_OTHER)

## 2024-08-03 VITALS — BP 113/69 | HR 87 | Ht 62.0 in | Wt 142.8 lb

## 2024-08-03 DIAGNOSIS — R399 Unspecified symptoms and signs involving the genitourinary system: Secondary | ICD-10-CM

## 2024-08-03 LAB — POCT URINALYSIS DIPSTICK OB
Appearance: ABNORMAL
Bilirubin, UA: NEGATIVE
Blood, UA: NEGATIVE
Glucose, UA: NEGATIVE
Ketones, UA: NEGATIVE
Nitrite, UA: NEGATIVE
Odor: NORMAL
POC,PROTEIN,UA: NEGATIVE
Spec Grav, UA: 1.02 (ref 1.010–1.025)
Urobilinogen, UA: 0.2 U/dL
pH, UA: 5 (ref 5.0–8.0)

## 2024-08-03 NOTE — Progress Notes (Signed)
    NURSE VISIT NOTE  Subjective:    Patient ID: ANAROSA KUBISIAK, female    DOB: April 21, 2001, 23 y.o.   MRN: [id number]       HPI  Patient is a 23 y.o. G8P0000 female who presents for urinary frequency and genital irritation for 1 week. She states she has been irritated with white discharge around her clitoris. Patient denies dysuria, hematuria, and vaginal discharge.  Patient does not have a history of recurrent UTI.  Patient does not have a history of pyelonephritis.    Objective:    BP 113/69   Pulse 87   Ht 5' 2 (1.575 m)   Wt 142 lb 12.8 oz (64.8 kg)   LMP 01/25/2024   BMI 26.12 kg/m    Lab Review  Results for orders placed or performed in visit on 08/03/24  POC Urinalysis Dipstick OB  Result Value Ref Range   Color, UA yellow    Clarity, UA cloudy    Glucose, UA Negative Negative   Bilirubin, UA Negative    Ketones, UA Negative    Spec Grav, UA 1.020 1.010 - 1.025   Blood, UA Negative    pH, UA 5.0 5.0 - 8.0   POC,PROTEIN,UA Negative Negative, Trace, Small (1+), Moderate (2+), Large (3+), 4+   Urobilinogen, UA 0.2 0.2 or 1.0 E.U./dL   Nitrite, UA Negative    Leukocytes, UA Trace (A) Negative   Appearance abnormal    Odor normal     Assessment:   1. UTI symptoms      Plan:   Urine Culture Sent. Maintain adequate hydration.  Follow up if symptoms worsen or fail to improve as anticipated, and as needed.  Will await urine culture results. Patient advised to use OTC Monistat 7 externally to affected area for yeast symptoms. She will follow up next week as scheduled for 28 week labs and ROB.     Camelia Bars, LPN

## 2024-08-03 NOTE — Patient Instructions (Signed)
 Pregnancy and Urinary Tract Infection  A urinary tract infection (UTI) is an infection of any part of the urinary tract. This includes the kidneys, the tubes that connect the kidneys to the bladder (ureters), the bladder, and the tube that carries urine out of the body (urethra). These organs make, store, and get rid of urine in the body. Your health care provider may use other names to describe the infection. An upper UTI affects the ureters and kidneys (pyelonephritis). A lower UTI affects the bladder (cystitis) and urethra (urethritis). Most UTIs are caused by bacteria in the genital area, around the entrance to the urinary tract. These bacteria grow and cause irritation and inflammation of the urinary tract. You are more likely to develop a UTI during pregnancy because: The physical and hormonal changes that your body goes through make it easier for bacteria to get into your urinary tract. Your growing baby puts pressure on your bladder and can affect urine flow. Pregnant women with diabetes are at an increased risk for developing a UTI. It is important to recognize and treat UTIs in pregnancy because they can cause serious complications for both you and your baby. How does this affect me? Symptoms of a UTI include: Needing to urinate right away (urgently) and often, even if urinating a small amount. Pain, burning, or having a hard time passing urine. Blood in the urine. Unusual, cloudy, and bad-smelling urine. Pain in the abdomen or lower back. Vaginal discharge. You may also have: Vomiting or a decreased appetite. Confusion. Irritability or tiredness. A fever. Diarrhea. A low level of red blood cells (anemia). The development of high blood pressure during pregnancy (preeclampsia). How does this affect my baby? An untreated UTI during pregnancy could lead to a kidney infection or an infection throughout the mother's body (systemic infection). This can cause health problems and affect the  baby. Possible complications of an untreated UTI include: Your baby being born before 37 weeks of pregnancy (premature). Your baby being born with a low birth weight. Your baby having a higher risk of having his or her skin or the white parts of the eyes turn yellow (jaundice). What can I do to lower my risk? To prevent a UTI: Do not hold urine for long periods of time. Empty your bladder as soon as you feel the urge. Always wipe from front to back, especially after a bowel movement. Use each tissue one time when you wipe. Empty your bladder after sex. Keep your genital area dry. Drink 6 to 8 glasses of water each day. Do not douche or use deodorant sprays. Wear cotton underwear and loose clothing. How is this treated? Treatment for this condition may include: Antibiotic medicines that are safe to take during pregnancy. Other medicines to treat less common causes of UTI. Follow these instructions at home: If you were prescribed an antibiotic medicine, take it as told by your health care provider. Do not stop using the antibiotic even if you start to feel better. Keep all follow-up visits. This is important. Contact a health care provider if: Your symptoms do not improve or they get worse. You have abnormal vaginal discharge. Get help right away if you: Have a fever. Have nausea and vomiting. Have back or side pain. Have lower belly pain, tightness, or feel contractions in your uterus. Have a gush of fluid from your vagina. Have blood in your urine. Summary A UTI is an infection of any part of the urinary tract, which includes the kidneys, ureters, bladder,  and urethra. Most urinary tract infections are caused by bacteria in your genital area, around the entrance to your urinary tract (urethra). You are more likely to develop a UTI during pregnancy. It is important to recognize and treat UTIs in pregnancy because of the risk of serious complications for both you and your baby. If you  were prescribed an antibiotic medicine, take it as told by your health care provider. Do not stop using the antibiotic even if you start to feel better. This information is not intended to replace advice given to you by your health care provider. Make sure you discuss any questions you have with your health care provider. Document Revised: 05/03/2021 Document Reviewed: 05/03/2021 Elsevier Patient Education  2024 [employer].

## 2024-08-05 LAB — URINE CULTURE: Organism ID, Bacteria: NO GROWTH

## 2024-08-05 NOTE — Progress Notes (Signed)
 Assessment and Plan:   Carmen Black was seen today for follow-up.  Diagnoses and all orders for this visit:  Upper respiratory tract infection, unspecified type  - On exam, patient speaking with ease, nontoxic looking, no secondary infections in the sinus cavities, throat, ears.  Lungs clear. - Clarified with patient difficulty breathing is secondary to nasal congestion, no chest congestion- patient found nebulizer mist helpful to clear nasal congestion. - Patient denies shortness of breath, feels runny eyes, runny nose--slowly improving. - Offered patient rx for ICS-LABA inhaler, but pt declined stating the problem was primarily caused by nasal congestion. - Advised patient continue oral antihistamine, nasal saline rinses, nasal steroid spray, Vicks vapor rub. - Provided patient with nasal saline solution to use in the nebulizer she has at home. - Increase fluids, rest, Tylenol  for fever/pain, Pt keen to return to work/stressed as illness has delayed her ability to return to work. - Return instructions reviewed with patient.   Barriers to recommended plan: None identified  Return for With [facility] 08/12/24.   Subjective:   HPI: Carmen Black is a 23 y.o. female here for Follow-up (States inhaler is not working would prefer to have nebulizer medication. ).  History of Present Illness   Carmen Black is a 23 year old female who presents with persistent respiratory symptoms and congestion while [redacted] weeks pregnant.  She has been experiencing persistent difficulty breathing and nasal congestion for the past three weeks, primarily in her nasal passages with less discomfort in her lungs. She initially sought care at urgent care, the hospital, and this clinic without significant improvement. Initially diagnosed with a sinus infection, she was prescribed antibiotics, which were later discontinued. She received two breathing treatments and fluids at the emergency room.  A respiratory  panel conducted at this clinic tested positive for rhinovirus. She uses an inhaler prescribed by Dr. Keven, which provides short-term relief for about 30 minutes. Additionally, she uses Flonase , saline, and Vaseline to manage her symptoms. Due to her pregnancy, she is unable to take over-the-counter medications. 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  Her past medical history includes a single asthma attack at age 59, for which she used her brother's inhaler. She has not been formally diagnosed with asthma but has used a nebulizer in the past. Her brother has asthma. In the last two weeks, she has used her inhaler multiple times daily. She reports nasal congestion, 'gunky' eyes, ear drainage, and throat issues, which have mostly cleared up, but she continues to experience nasal congestion, which she feels is impacting her breathing.  She is [redacted] weeks pregnant and reports drinking six 20-ounce bottles of water daily, along with hot teas. She experienced dehydration, which improved after receiving fluids at the emergency room. She reports coughing up white mucus and having a dry nose, which sometimes bleeds. She lives with her cousin and her cousin's fianc, who smoke, but she is not around them when they smoke. She is stressed about missing work and the upcoming baby shower in two weeks.       I have reviewed past medical, surgical, medications, allergies, social and family histories today and updated them in Epic where appropriate.  ROS:  Review of Systems  HENT:  Positive for rhinorrhea, sinus pressure and sinus pain.   Respiratory:  Positive for cough. Negative for apnea, chest tightness and shortness of breath.  Cardiovascular: Negative.   All other systems reviewed and are negative.    Review of systems negative unless otherwise noted as per HPI.    Objective:   Vitals:   08/05/24 1330  BP: 120/72   Pulse: 101  Temp: 36.3 C (97.3 F)  SpO2: 98%   Body mass index is 26.19 kg/m.  Physical Exam Vitals and nursing note reviewed.  Constitutional:      General: She is not in acute distress.    Appearance: Normal appearance. She is not ill-appearing, toxic-appearing or diaphoretic.  HENT:     Head: Normocephalic and atraumatic.     Right Ear: Tympanic membrane, ear canal and external ear normal.     Left Ear: Tympanic membrane, ear canal and external ear normal.     Nose: Congestion and rhinorrhea present.     Mouth/Throat:     Mouth: Mucous membranes are moist.     Pharynx: Oropharynx is clear.     Comments: Uvula midline, mild erythema noted in posterior pharynx without ulcers, without exudate.    Cardiovascular:     Rate and Rhythm: Normal rate and regular rhythm.     Pulses: Normal pulses.     Heart sounds: Normal heart sounds.     Comments: HR 80 Pulmonary:     Effort: Pulmonary effort is normal. No respiratory distress.     Breath sounds: Normal breath sounds. No stridor. No wheezing, rhonchi or rales.  Chest:     Chest wall: No tenderness.  Musculoskeletal:        General: Normal range of motion.     Cervical back: Normal range of motion and neck supple.  Skin:    General: Skin is warm.     Capillary Refill: Capillary refill takes less than 2 seconds.  Neurological:     General: No focal deficit present.     Mental Status: She is alert and oriented to person, place, and time. Mental status is at baseline.  Psychiatric:        Mood and Affect: Mood normal.        Behavior: Behavior normal.        Thought Content: Thought content normal.        Judgment: Judgment normal.       Medication adherence and barriers to the treatment plan have been addressed. Opportunities to optimize healthy behaviors have been discussed. Patient / caregiver voiced understanding.  I personally spent 30 minutes face-to-face and non-face-to-face in the care of this patient, which  includes all pre, intra, and post visit time on the date of service. Alfonso Grow, DNP, FNP-C [facility] [phone] (F)  Note - This record has been created using [employer]. Chart creation errors have been sought, but may not always have been located. Such creation errors do not reflect on the standard of medical care.

## 2024-08-06 ENCOUNTER — Ambulatory Visit: Payer: Self-pay

## 2024-08-06 ENCOUNTER — Encounter: Payer: Self-pay | Admitting: Obstetrics and Gynecology

## 2024-08-06 ENCOUNTER — Observation Stay
Admission: EM | Admit: 2024-08-06 | Discharge: 2024-08-06 | Disposition: A | Discharge status: Home / Self Care | Attending: Certified Nurse Midwife | Admitting: Certified Nurse Midwife

## 2024-08-06 ENCOUNTER — Other Ambulatory Visit: Payer: Self-pay

## 2024-08-06 DIAGNOSIS — K602 Anal fissure, unspecified: Secondary | ICD-10-CM | POA: Diagnosis not present

## 2024-08-06 DIAGNOSIS — Z3A27 27 weeks gestation of pregnancy: Secondary | ICD-10-CM | POA: Insufficient documentation

## 2024-08-06 DIAGNOSIS — K625 Hemorrhage of anus and rectum: Secondary | ICD-10-CM

## 2024-08-06 DIAGNOSIS — K644 Residual hemorrhoidal skin tags: Secondary | ICD-10-CM | POA: Diagnosis not present

## 2024-08-06 DIAGNOSIS — O2242 Hemorrhoids in pregnancy, second trimester: Secondary | ICD-10-CM | POA: Diagnosis present

## 2024-08-06 DIAGNOSIS — O99612 Diseases of the digestive system complicating pregnancy, second trimester: Secondary | ICD-10-CM

## 2024-08-06 DIAGNOSIS — Z3480 Encounter for supervision of other normal pregnancy, unspecified trimester: Principal | ICD-10-CM

## 2024-08-06 MED ORDER — DOCUSATE SODIUM 100 MG PO CAPS: 100.0000 mg | ORAL_CAPSULE | Freq: Two times a day (BID) | ORAL | 2 refills | Status: DC

## 2024-08-06 NOTE — OB Triage Provider Note (Signed)
      L&D OB Triage Note  SUBJECTIVE Carmen Black is a 23 y.o. G1P0000 female at [redacted]w[redacted]d, EDD Estimated Date of Delivery: 10/31/24 who presented to triage with complaints of cramping and bleeding. Pt state she was on the toilet and when she stood up she noticed bright red blood in the toilet. 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 She was unsure if the blood was from her vaginal or her rectum. She denies loss of fluid and contractions. She is feeling good movement.   OB History  Gravida Para Term Preterm AB Living  1 0 0 0 0 0  SAB IAB Ectopic Multiple Live Births  0 0 0 0 0    # Outcome Date GA Lbr Len/2nd Weight Sex Type Anes PTL Lv  1 Current             Medications Prior to Admission  Medication Sig Dispense Refill Last Dose/Taking   amoxicillin -clavulanate (AUGMENTIN ) 875-125 MG tablet Take 1 tablet by mouth every 12 (twelve) hours. 14 tablet 0 Past Week   aspirin  EC 81 MG tablet Take 1 tablet (81 mg total) by mouth daily. Take after 12 weeks for prevention of preeclampssia later in pregnancy 90 tablet 2 Past Week   fluticasone  (FLONASE ) 50 MCG/ACT nasal spray Place 2 sprays into both nostrils daily. 16 g 0 08/06/2024   ondansetron  (ZOFRAN ) 4 MG tablet Take 1 tablet (4 mg total) by mouth every 8 (eight) hours as needed for nausea or vomiting. 20 tablet 0 08/06/2024   Prenatal Vit-Fe Fumarate-FA (PRENATAL PLUS VITAMIN/MINERAL) 27-1 MG TABS Take 1 tablet by mouth daily. 90 tablet 3 08/06/2024   promethazine -dextromethorphan (PROMETHAZINE -DM) 6.25-15 MG/5ML syrup Take 5 mLs by mouth 4 (four) times daily as needed for cough. (Patient not taking: Reported on 08/06/2024) 118 mL 0 Not Taking     OBJECTIVE  Nursing Evaluation:   BP (!) 138/52 (BP Location: Left Arm)   Pulse (!) 115   Temp 98.3 F (36.8 C) (Oral)   Resp 18   Ht 5' 2 (1.575 m)   Wt 64.9 kg   LMP 01/25/2024   SpO2 99%   BMI 26.16 kg/m    Findings:   Sterile speculum : no blood in vagina or cervix.        NST was performed and has been reviewed by me.  NST INTERPRETATION: Category I  Mode: External Baseline Rate (A): 150 bpm Variability: Moderate Accelerations: 15 x 15 Decelerations: None (broken strip at times, provider OK with strip, will dc patient)     Contraction Frequency (min): UI/none noted  ASSESSMENT Impression:  1.  Pregnancy:  G1P0000 at [redacted]w[redacted]d , EDD Estimated Date of Delivery: 10/31/24 2.  Reassuring fetal and maternal status 3.  Rectal bleeding , anal fissure. On exam small external hemorrhoid noted , not currently inflamed. No blood visualized externally.   PLAN 1. Current condition and above findings reviewed.  Reassuring fetal and maternal condition. Encouraged use of colace BID to decrease straining with bowel movement. Encouraged PO hydration and fiber.  2. Discharge home with standard labor precautions given to return to L&D or call the office for problems. 3. Continue routine prenatal care.  I was present and evaluated this patient in person.   Zelda Hummer, CNM

## 2024-08-06 NOTE — OB Triage Note (Signed)
 Carmen Black 23 y.o. @[redacted]w[redacted]d  G1P0 presents to Labor & Delivery triage via wheelchair steered by ED staff reporting cramping that started a few days ago and bleeding that she noticed around 2130. There was a moderate amount of blood in the toilet bowl that she reports. She is unsure if it is vaginal bleeding or from her hemorrhoid. She denies signs and symptoms consistent with rupture of membranes. She denies contractions and states positive fetal movement. External FM and TOCO applied to non-tender abdomen. Initial FHR 150. Vital signs obtained and within normal limits. Patient oriented to care environment including call bell and bed control use. Sebastian, CNM notified of patient's arrival. Plan to spec exam and complete NST. Nhia Heaphy L. Jenay Morici, RN BSN 08/06/2024 10:27 PM

## 2024-08-10 ENCOUNTER — Other Ambulatory Visit

## 2024-08-10 ENCOUNTER — Encounter: Admitting: Licensed Practical Nurse

## 2024-08-11 NOTE — Progress Notes (Unsigned)
    Return Prenatal Note   Subjective   23 y.o. G1P0000 at [redacted]w[redacted]d presents for this follow-up prenatal visit.  Patient *** Patient reports:    Objective   Flow sheet Vitals:   Total weight gain: 7 lb (3.175 kg)  General Appearance  No acute distress, well appearing, and well nourished Pulmonary   Normal work of breathing Neurologic   Alert and oriented to person, place, and time Psychiatric   Mood and affect within normal limits   Assessment/Plan   Plan  23 y.o. G1P0000 at [redacted]w[redacted]d presents for follow-up OB visit. Reviewed prenatal record including previous visit note.  No problem-specific Assessment & Plan notes found for this encounter.      No orders of the defined types were placed in this encounter.  No follow-ups on file.   Future Appointments  Date Time Provider Department Center  08/12/2024 10:00 AM [facility] None  08/12/2024 10:15 AM Louis Ivery, Jinnie Jansky, CNM [facility] None    For next visit:  {[email or web address]}     Burnard LITTIE Ro, CMA  08/11/2510:23 AM

## 2024-08-12 ENCOUNTER — Other Ambulatory Visit

## 2024-08-12 ENCOUNTER — Ambulatory Visit (INDEPENDENT_AMBULATORY_CARE_PROVIDER_SITE_OTHER): Admitting: Licensed Practical Nurse

## 2024-08-12 ENCOUNTER — Encounter: Payer: Self-pay | Admitting: Licensed Practical Nurse

## 2024-08-12 VITALS — BP 125/65 | HR 91 | Wt 144.6 lb

## 2024-08-12 DIAGNOSIS — Z3480 Encounter for supervision of other normal pregnancy, unspecified trimester: Secondary | ICD-10-CM

## 2024-08-12 DIAGNOSIS — Z3A28 28 weeks gestation of pregnancy: Secondary | ICD-10-CM

## 2024-08-12 DIAGNOSIS — Z131 Encounter for screening for diabetes mellitus: Secondary | ICD-10-CM

## 2024-08-12 DIAGNOSIS — Z113 Encounter for screening for infections with a predominantly sexual mode of transmission: Secondary | ICD-10-CM

## 2024-08-12 DIAGNOSIS — Z3483 Encounter for supervision of other normal pregnancy, third trimester: Secondary | ICD-10-CM | POA: Diagnosis not present

## 2024-08-12 DIAGNOSIS — Z23 Encounter for immunization: Secondary | ICD-10-CM

## 2024-08-12 DIAGNOSIS — Z114 Encounter for screening for human immunodeficiency virus [HIV]: Secondary | ICD-10-CM

## 2024-08-12 DIAGNOSIS — Z130 Encounter for screening for diseases of the blood and blood-forming organs and certain disorders involving the immune mechanism: Secondary | ICD-10-CM

## 2024-08-12 NOTE — Patient Instructions (Signed)
[email or web address]

## 2024-08-12 NOTE — Assessment & Plan Note (Addendum)
-  TWG 8lbs, appetite is good eats frequently -was not planning to take CBE, discussed options, link placed in AVS -plans to try breastfeeding, discussed what to expect at the hospital-skin to skin rooming in etc.  -28 wk labs and TDAP today -will use [facility] for peds  -warning signs reviewed

## 2024-08-13 LAB — 28 WEEK RH+PANEL
Basophils Absolute: 0.1 10*3/uL (ref 0.0–0.2)
Basos: 1 %
EOS (ABSOLUTE): 0.1 10*3/uL (ref 0.0–0.4)
Eos: 1 %
Gestational Diabetes Screen: 169 mg/dL — ABNORMAL HIGH (ref 70–139)
HIV Screen 4th Generation wRfx: NONREACTIVE
Hematocrit: 34 % (ref 34.0–46.6)
Hemoglobin: 11.5 g/dL (ref 11.1–15.9)
Immature Grans (Abs): 0 10*3/uL (ref 0.0–0.1)
Immature Granulocytes: 0 %
Lymphocytes Absolute: 1.6 10*3/uL (ref 0.7–3.1)
Lymphs: 22 %
MCH: 32.2 pg (ref 26.6–33.0)
MCHC: 33.8 g/dL (ref 31.5–35.7)
MCV: 95 fL (ref 79–97)
Monocytes Absolute: 0.8 10*3/uL (ref 0.1–0.9)
Monocytes: 11 %
Neutrophils Absolute: 4.6 10*3/uL (ref 1.4–7.0)
Neutrophils: 65 %
Platelets: 276 10*3/uL (ref 150–450)
RBC: 3.57 x10E6/uL — ABNORMAL LOW (ref 3.77–5.28)
RDW: 11.4 % — ABNORMAL LOW (ref 11.7–15.4)
RPR Ser Ql: NONREACTIVE
WBC: 7.2 10*3/uL (ref 3.4–10.8)

## 2024-08-14 ENCOUNTER — Telehealth: Payer: Self-pay

## 2024-08-14 ENCOUNTER — Ambulatory Visit: Payer: Self-pay | Admitting: Certified Nurse Midwife

## 2024-08-14 NOTE — Telephone Encounter (Signed)
 Received call on triage line from patient who reports not feeling well while she was driving home.  She says she felt droopy and dizzy and her brain felt foggy.  She called her mom who told her to take her blood sugars and blood pressure.  She reports her sugars are 120 >5 hours after eating.  She checked her blood pressure once with a home cuff and it was 140/79.  She denies any headache, vision changes or RUQ pain.  Advised patient should go home and drink a big glass of water and rest.  Counseled we expect her sugars to be a little high as she failed her one hour and is scheduled to have her 3 hour glucose test on Tuesday.  Recommended she avoid sugary and high carbohydrate foods until we can ensure her 3 hour is normal.   She will monitor her blood pressure at home after drinking water and resting and report if pressures are staying over 140/90.  If she develops additional symptoms over the weekend she will report to the ED.  She will call us  on Monday and let us  know what her pressures were like over the weekend and we can add PIH labs to her 3 hour glucose if needed.

## 2024-08-18 ENCOUNTER — Other Ambulatory Visit: Payer: Self-pay

## 2024-08-18 ENCOUNTER — Other Ambulatory Visit

## 2024-08-18 DIAGNOSIS — Z131 Encounter for screening for diabetes mellitus: Secondary | ICD-10-CM

## 2024-08-19 ENCOUNTER — Ambulatory Visit: Payer: Self-pay | Admitting: Obstetrics

## 2024-08-19 LAB — GESTATIONAL GLUCOSE TOLERANCE
Glucose, Fasting: 74 mg/dL (ref 70–94)
Glucose, GTT - 1 Hour: 136 mg/dL (ref 70–179)
Glucose, GTT - 2 Hour: 94 mg/dL (ref 70–154)
Glucose, GTT - 3 Hour: 54 mg/dL — ABNORMAL LOW (ref 70–139)

## 2024-08-24 ENCOUNTER — Other Ambulatory Visit: Payer: Self-pay | Admitting: Obstetrics and Gynecology

## 2024-08-24 DIAGNOSIS — O219 Vomiting of pregnancy, unspecified: Secondary | ICD-10-CM

## 2024-08-24 NOTE — Patient Instructions (Signed)
 Third Trimester of Pregnancy  The third trimester of pregnancy is from week 28 through week 40. This is months 7 through 9. The third trimester is a time when your baby is growing fast. Body changes during your third trimester Your body continues to change during this time. The changes usually go away after your baby is born. Physical changes You will continue to gain weight. You may get stretch marks on your hips, belly, and breasts. Your breasts will keep growing and may hurt. A yellow fluid (colostrum) may leak from your breasts. This is the first milk you're making for your baby. Your hair may grow faster and get thicker. In some cases, you may get hair loss. Your belly button may stick out. You may have more swelling in your hands, face, or ankles. Health changes You may have heartburn. You may feel short of breath. This is caused by the uterus that is now bigger. You may have more aches in the pelvis, back, or thighs. You may have more tingling or numbness in your hands, arms, and legs. You may pee more often. You may have trouble pooping (constipation) or swollen veins in the butt that can itch or get painful (hemorrhoids). Other changes You may have more problems sleeping. You may notice the baby moving lower in your belly (dropping). You may have more fluid coming from your vagina. Your joints may feel loose, and you may have pain around your pelvic bone. Follow these instructions at home: Medicines Take medicines only as told by your health care provider. Some medicines are not safe during pregnancy. Your provider may change the medicines that you take. Do not take any medicines unless told to by your provider. Take a prenatal vitamin that has at least 600 micrograms (mcg) of folic acid. Do not use herbal medicines, illegal drugs, or medicines that are not approved by your provider. Eating and drinking While you're pregnant your body needs additional nutrition to help  support your growing baby. Talk with your provider about your nutritional needs. Activity Most women are able to exercise regularly during pregnancy. Exercise routines may need to change at the end of your pregnancy. Talk to your provider about your activities and exercise routine. Relieving pain and discomfort Rest often with your legs raised if you have leg cramps or low back pain. Take warm sitz baths to soothe pain from hemorrhoids. Use hemorrhoid cream if your provider says it's okay. Wear a good, supportive bra if your breasts hurt. Do not use hot tubs, steam rooms, or saunas. Do not douche. Do not use tampons or scented pads. Safety Talk to your provider before traveling far distances. Wear your seatbelt at all times when you're in a car. Talk to your provider if someone hits you, hurts you, or yells at you. Preparing for birth To prepare for your baby: Take childbirth and breastfeeding classes. Visit the hospital and tour the maternity area. Buy a rear-facing car seat. Learn how to install it in your car. General instructions Avoid cat litter boxes and soil used by cats. These things carry germs that can cause harm to your pregnancy and your baby. Do not drink alcohol, smoke, vape, or use products with nicotine  or tobacco in them. If you need help quitting, talk with your provider. Keep all follow-up visits for your third trimester. Your provider will do more exams and tests during this trimester. Write down your questions. Take them to your prenatal visits. Your provider also will: Talk with you about  your overall health. Give you advice or refer you to specialists who can help with different needs, including: Mental health and counseling. Foods and healthy eating. Ask for help if you need help with food. Where to find more information American Pregnancy Association: americanpregnancy.org [employer]necologists: acog.org Office on [employer] Health:  travellesson.ca Contact a health care provider if: You have a headache that does not go away when you take medicine. You have any of these problems: You can't eat or drink. You have nausea and vomiting. You have watery poop (diarrhea) for 2 days or more. You have pain when you pee, or your pee smells bad. You have been sick for 2 days or more and aren't getting better. Contact your provider right away if: You have any of these coming from your vagina: Abnormal discharge. Bad-smelling fluid. Bleeding. Your baby is moving less than usual. You have signs of labor: You have any contractions, belly cramping, or have pain in your pelvis or lower back before 37 weeks of pregnancy (preterm labor). You have regular contractions that are less than 5 minutes apart. Your water breaks. You have symptoms of high blood pressure or preeclampsia. These include: A severe, throbbing headache that does not go away. Sudden or extreme swelling of your face, hands, legs, or feet. Vision problems: You see spots. You have blurry vision. Your eyes are sensitive to light. If you can't reach your provider, go to an urgent care or emergency room. Get help right away if: You faint, become confused, or can't think clearly. You have chest pain or trouble breathing. You have any kind of injury, such as from a fall or a car crash. These symptoms may be an emergency. Call 911 right away. Do not wait to see if the symptoms will go away. Do not drive yourself to the hospital. This information is not intended to replace advice given to you by your health care provider. Make sure you discuss any questions you have with your health care provider. Document Revised: 06/20/2023 Document Reviewed: 01/18/2023 Elsevier Patient Education  2024 [employer] Inc[employer]  Group B Streptococcus Test During Pregnancy Why am I having this test? Routine testing, also called screening, for group B streptococcus (GBS) is recommended for all  pregnant women between the 36th and 37th week of pregnancy. GBS is a type of bacteria that can be passed from mother to baby during childbirth. Screening will help guide whether or not you will need treatment during labor and delivery to prevent complications such as: An infection in your uterus during labor. An infection in your uterus after delivery. A serious infection in your baby after delivery, such as pneumonia, meningitis, or sepsis. GBS screening is not often done before 36 weeks of pregnancy unless you go into labor prematurely. What happens if I have group B streptococcus? If testing shows that you have GBS, your health care provider will recommend treatment with IV antibiotics during labor and delivery. This treatment significantly decreases the risk of complications for you and your baby. If you have a planned C-section and you have GBS, you may not need to be treated with antibiotics because GBS is usually passed to babies after labor starts and your water breaks. If you are in labor or your water breaks before your C-section, it is possible for GBS to get into your uterus and be passed to your baby, so you might need treatment. Is there a chance I may not need to be tested? You may not  need to be tested for GBS if: You have a urine test that shows GBS before 36 to 37 weeks. You had a baby with GBS infection after a previous delivery. In these cases, you will automatically be treated for GBS during labor and delivery. What is being tested? This test is done to check if you have group B streptococcus in your vagina or rectum. What kind of sample is taken? To collect samples for this test, your health care provider will swab your vagina and rectum with a cotton swab. The sample is then sent to the lab to see if GBS is present. What happens during the test?  You will remove your clothing from the waist down. You will lie down on an exam table in the same position as you would for a  pelvic exam. Your health care provider will swab your vagina and rectum to collect samples for a culture test. You will be able to go home after the test and do all your usual activities. How are the results reported? The test results are reported as positive or negative. What do the results mean? A positive test means you are at risk for passing GBS to your baby during labor and delivery. Your health care provider will recommend that you are treated with an IV antibiotic during labor and delivery. A negative test means you are at very low risk of passing GBS to your baby. There is still a low risk of passing GBS to your baby because sometimes test results may report that you do not have a condition when you do (false-negative result) or there is a chance that you may become infected with GBS after the test is done. You most likely will not need to be treated with an antibiotic during labor and delivery. Talk with your health care provider about what your results mean. Questions to ask your health care provider Ask your health care provider, or the department that is doing the test: When will my results be ready? How will I get my results? What are my treatment options? Summary Routine testing (screening) for group B streptococcus (GBS) is recommended for all pregnant women between the 36th and 37th week of pregnancy. GBS is a type of bacteria that can be passed from mother to baby during childbirth. If testing shows that you have GBS, your health care provider will recommend that you are treated with IV antibiotics during labor and delivery. This treatment almost always prevents infection in newborns. This information is not intended to replace advice given to you by your health care provider. Make sure you discuss any questions you have with your health care provider. Document Revised: 09/03/2022 Document Reviewed: 09/03/2022 Elsevier Patient Education  2024 [employer].
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
## 2024-08-25 ENCOUNTER — Encounter: Payer: Self-pay | Admitting: Certified Nurse Midwife

## 2024-08-25 ENCOUNTER — Ambulatory Visit (INDEPENDENT_AMBULATORY_CARE_PROVIDER_SITE_OTHER): Admitting: Certified Nurse Midwife

## 2024-08-25 ENCOUNTER — Encounter: Admitting: Certified Nurse Midwife

## 2024-08-25 VITALS — BP 112/74 | HR 90 | Wt 143.9 lb

## 2024-08-25 DIAGNOSIS — Z3A30 30 weeks gestation of pregnancy: Secondary | ICD-10-CM

## 2024-08-25 DIAGNOSIS — Z3403 Encounter for supervision of normal first pregnancy, third trimester: Secondary | ICD-10-CM

## 2024-08-25 DIAGNOSIS — Z3480 Encounter for supervision of other normal pregnancy, unspecified trimester: Secondary | ICD-10-CM

## 2024-08-25 MED ORDER — ONDANSETRON 4 MG PO TBDP
4.0000 mg | ORAL_TABLET | Freq: Three times a day (TID) | ORAL | 2 refills | Status: AC | PRN
Start: 2024-08-25 — End: ?

## 2024-08-25 NOTE — Assessment & Plan Note (Signed)
 Reviewed kick counts and preterm labor warning signs. Instructed to call office or come to hospital with persistent headache, vision changes, regular contractions, leaking of fluid, decreased fetal movement or vaginal bleeding.

## 2024-09-07 ENCOUNTER — Observation Stay
Admission: EM | Admit: 2024-09-07 | Discharge: 2024-09-07 | Disposition: A | Discharge status: Home / Self Care | Attending: Advanced Practice Midwife | Admitting: Advanced Practice Midwife

## 2024-09-07 ENCOUNTER — Encounter: Payer: Self-pay | Admitting: Obstetrics & Gynecology

## 2024-09-07 ENCOUNTER — Other Ambulatory Visit: Payer: Self-pay

## 2024-09-07 DIAGNOSIS — Z3480 Encounter for supervision of other normal pregnancy, unspecified trimester: Principal | ICD-10-CM

## 2024-09-07 DIAGNOSIS — Z3A32 32 weeks gestation of pregnancy: Secondary | ICD-10-CM

## 2024-09-07 LAB — URINALYSIS, COMPLETE (UACMP) WITH MICROSCOPIC
Bilirubin Urine: NEGATIVE
Glucose, UA: NEGATIVE mg/dL
Hgb urine dipstick: NEGATIVE
Ketones, ur: NEGATIVE mg/dL
Nitrite: NEGATIVE
Protein, ur: NEGATIVE mg/dL
RBC / HPF: 0 RBC/hpf (ref 0–5)
Specific Gravity, Urine: 1.017 (ref 1.005–1.030)
pH: 7 (ref 5.0–8.0)

## 2024-09-07 LAB — WET PREP, GENITAL
Clue Cells Wet Prep HPF POC: NONE SEEN
Sperm: NONE SEEN
Trich, Wet Prep: NONE SEEN
WBC, Wet Prep HPF POC: <10 (ref <10)
Yeast Wet Prep HPF POC: NONE SEEN

## 2024-09-07 LAB — RUPTURE OF MEMBRANE (ROM)PLUS: Rom Plus: NEGATIVE

## 2024-09-07 MED ORDER — ACETAMINOPHEN 500 MG PO TABS
1000.0000 mg | ORAL_TABLET | Freq: Four times a day (QID) | ORAL | Status: DC | PRN
  Administered 2024-09-07: 1000 mg via ORAL

## 2024-09-07 MED ORDER — ACETAMINOPHEN 500 MG PO TABS
ORAL_TABLET | ORAL | Status: DC
  Filled 2024-09-07: qty 2
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
## 2024-09-07 NOTE — OB Triage Note (Signed)
 23 y.o G1P0 presents to L&D triage at [redacted]w[redacted]d c/o lower back pain, leg pain, headache, round ligament pain, and increased LOF. She reports that her LOF has been increased in the past 2 days and says that it's clear/white w/o odor. Her back pain has been worsening as well since last Thursday and she reports it's worse while laying down. Pt denies dysuria/polyuria and recent infections. Pt denies vaginal bleeding and has felt fetal movement but reports it's been decreased. [place] CNM aware of pt arrival, POC includes fetal monitoring w/ vaginal swabs and UA. Initial vitals wnl. Fht of 155.

## 2024-09-07 NOTE — OB Triage Note (Signed)
 Pt. Being discharged by Lynda, CNM home w labor precautions. Pain relief during pregnancy discussed and pt verbalized understanding. Leaving stable, reports headache is better than when she first arrived.

## 2024-09-08 LAB — URINE CULTURE: Culture: NO GROWTH

## 2024-09-10 NOTE — Progress Notes (Unsigned)
° ° °  Return Prenatal Note   Subjective   23 y.o. G1P0000 at [redacted]w[redacted]d presents for this follow-up prenatal visit.  Patient *** Patient reports:    Objective   Flow sheet Vitals:   Total weight gain: 7 lb 14.4 oz (3.583 kg)  General Appearance  No acute distress, well appearing, and well nourished Pulmonary   Normal work of breathing Neurologic   Alert and oriented to person, place, and time Psychiatric   Mood and affect within normal limits   Assessment/Plan   Plan  23 y.o. G1P0000 at [redacted]w[redacted]d presents for follow-up OB visit. Reviewed prenatal record including previous visit note.  No problem-specific Assessment & Plan notes found for this encounter.      No orders of the defined types were placed in this encounter.  No follow-ups on file.   Future Appointments  Date Time Provider Department Center  09/11/2024 10:35 AM Dominic, Jinnie Jansky, CNM [facility] None    For next visit:  {[email or web address]}     Burnard LITTIE Ro, CMA  12/11/202511:49 AM

## 2024-09-11 ENCOUNTER — Encounter: Payer: Self-pay | Admitting: Licensed Practical Nurse

## 2024-09-11 ENCOUNTER — Ambulatory Visit: Admitting: Licensed Practical Nurse

## 2024-09-11 VITALS — BP 114/62 | HR 90 | Wt 147.1 lb

## 2024-09-11 DIAGNOSIS — Z3483 Encounter for supervision of other normal pregnancy, third trimester: Secondary | ICD-10-CM

## 2024-09-11 DIAGNOSIS — Z3A32 32 weeks gestation of pregnancy: Secondary | ICD-10-CM

## 2024-09-11 DIAGNOSIS — M545 Low back pain, unspecified: Secondary | ICD-10-CM

## 2024-09-11 DIAGNOSIS — Z3480 Encounter for supervision of other normal pregnancy, unspecified trimester: Secondary | ICD-10-CM

## 2024-09-11 NOTE — Progress Notes (Signed)
° ° °  Return Prenatal Note   Subjective   23 y.o. G1P0000 at [redacted]w[redacted]d presents for this follow-up prenatal visit.    Patient reports:Seems frustrated with having multiple discomforts,until recently she has been feeling great. She reports good fetal movement, increased vaginal discharge. She denies vaginal itching, irritation and odor.  Nausea: zofran  daily  - Mid low back pain: started 4 days ago went to triage as instructed by on call RN, discharge was also evaluated during that triage visit.  -vaginal pressure/pain, lightening crotch  -heartburn, milk helps  -mood has been good but does get irritable -is open to another pregnancy when ever does not want to use hormones-feels they messed her up is not interested in the copper IUD. Mentioned she is not able to insert a tampon and sex has always been uncomfortable-she has had a partner she trusts which makes it better discussed this could be improved by being seen at the pelvic pain clinic   Movement: Present Contractions: Irritability  Objective   Flow sheet Vitals: Pulse Rate: 90 BP: 114/62 Fundal Height: 32 cm Fetal Heart Rate (bpm): 155 Total weight gain: 11 lb 1.6 oz (5.035 kg)  General Appearance  No acute distress, well appearing, and well nourished Pulmonary   Normal work of breathing Neurologic   Alert and oriented to person, place, and time Psychiatric   Mood and affect within normal limits   Assessment/Plan   Plan  23 y.o. G1P0000 at [redacted]w[redacted]d presents for follow-up OB visit. Reviewed prenatal record including previous visit note.  Supervision of other normal pregnancy, antepartum -reading about childbirth, her friend Ronnald will be with her in labor, she does not want anyone else, she is comfortable laboring alone, she does not think she wants the FOB present for labor/birth, he may choose to parent after paternity testing -does not have a lot of PP support -has started Zoloft  but is not consistent  -Reassured her  discomforts are a normal part of the third trimester, but she dos not need to suffer. comfort measures for back pain reviewed, referral to PT placed  -TWG 11lbs, has gained 4lbs since last visit  -warning signs reviewed       Orders Placed This Encounter  Procedures   Ambulatory referral to Physical Therapy    Referral Priority:   Routine    Referral Type:   Physical Medicine    Referral Reason:   Specialty Services Required    Requested Specialty:   Physical Therapy    Number of Visits Requested:   1   Return in about 2 weeks (around 09/25/2024) for ROB.   Future Appointments  Date Time Provider Department Center  09/25/2024 10:35 AM Jayne Harlene CROME, CNM [facility] None     For next visit:  continue with routine prenatal care     Delinda Melnick, [facility] 12/12/202512:09 PM

## 2024-09-11 NOTE — Patient Instructions (Signed)
 Fetal Movement Counts When you're pregnant, you might start feeling your baby move around the middle of your pregnancy. At first, these movements might feel like flutters, rolls, or swishes. As your baby grows, you might feel more kicks and jabs. Around week 28 of your pregnancy, your health care team may ask you to count how often your baby moves. This is important for all pregnancies, but especially for high-risk ones. Counting movements can help lessen the risk of stillbirth. What is a fetal movement count? A fetal movement count is the number of times that you feel your baby move during a certain amount of time. This may also be called a kick count. There are many ways to do a kick count. Ask your team what is best for you. Pay attention to when your baby is most active. You may notice your baby's sleep and wake cycles. You may also notice things that make your baby move more. When you do a kick count, try to do it: When your baby is normally most active. At the same time each day. How do I count fetal movements?  Find a quiet, comfortable area. Sit or lie down. Write down the date, the start time, and the number of movements you feel. Count kicks, flutters, swishes, rolls, and jabs. Usually, you will feel at least 10 movements within 2 hours. Stop counting after you have felt 10 movements or if you have been counting for 2 hours. Write down the stop time. Contact a health care provider if: You don't feel 10 movements in 2 hours. Your baby isn't moving as it usually does. Your baby isn't moving at all. If you're not able to reach your provider, go to an emergency room. This information is not intended to replace advice given to you by your health care provider. Make sure you discuss any questions you have with your health care provider. Document Revised: 10/11/2023 Document Reviewed: 10/03/2022 Elsevier Patient Education  2025 [employer]vier Inc.Third Trimester of Pregnancy  The third trimester  of pregnancy is from week 28 through week 40. This is months 7 through 9. The third trimester is a time when your baby is growing fast. Body changes during your third trimester Your body continues to change during this time. The changes usually go away after your baby is born. Physical changes You will continue to gain weight. You may get stretch marks on your hips, belly, and breasts. Your breasts will keep growing and may hurt. A yellow fluid (colostrum) may leak from your breasts. This is the first milk you're making for your baby. Your hair may grow faster and get thicker. In some cases, you may get hair loss. Your belly button may stick out. You may have more swelling in your hands, face, or ankles. Health changes You may have heartburn. You may feel short of breath. This is caused by the uterus that is now bigger. You may have more aches in the pelvis, back, or thighs. You may have more tingling or numbness in your hands, arms, and legs. You may pee more often. You may have trouble pooping (constipation) or swollen veins in the butt that can itch or get painful (hemorrhoids). Other changes You may have more problems sleeping. You may notice the baby moving lower in your belly (dropping). You may have more fluid coming from your vagina. Your joints may feel loose, and you may have pain around your pelvic bone. Follow these instructions at home: Medicines Take medicines only as told by  your health care provider. Some medicines are not safe during pregnancy. Your provider may change the medicines that you take. Do not take any medicines unless told to by your provider. Take a prenatal vitamin that has at least 600 micrograms (mcg) of folic acid. Do not use herbal medicines, illegal drugs, or medicines that are not approved by your provider. Eating and drinking While you're pregnant your body needs additional nutrition to help support your growing baby. Talk with your provider about  your nutritional needs. Activity Most women are able to exercise regularly during pregnancy. Exercise routines may need to change at the end of your pregnancy. Talk to your provider about your activities and exercise routine. Relieving pain and discomfort Rest often with your legs raised if you have leg cramps or low back pain. Take warm sitz baths to soothe pain from hemorrhoids. Use hemorrhoid cream if your provider says it's okay. Wear a good, supportive bra if your breasts hurt. Do not use hot tubs, steam rooms, or saunas. Do not douche. Do not use tampons or scented pads. Safety Talk to your provider before traveling far distances. Wear your seatbelt at all times when you're in a car. Talk to your provider if someone hits you, hurts you, or yells at you. Preparing for birth To prepare for your baby: Take childbirth and breastfeeding classes. Visit the hospital and tour the maternity area. Buy a rear-facing car seat. Learn how to install it in your car. General instructions Avoid cat litter boxes and soil used by cats. These things carry germs that can cause harm to your pregnancy and your baby. Do not drink alcohol, smoke, vape, or use products with nicotine  or tobacco in them. If you need help quitting, talk with your provider. Keep all follow-up visits for your third trimester. Your provider will do more exams and tests during this trimester. Write down your questions. Take them to your prenatal visits. Your provider also will: Talk with you about your overall health. Give you advice or refer you to specialists who can help with different needs, including: Mental health and counseling. Foods and healthy eating. Ask for help if you need help with food. Where to find more information American Pregnancy Association: americanpregnancy.org [employer]ologists: acog.org Office on [employer] Health: [employer] Contact a health care provider if: You  have a headache that does not go away when you take medicine. You have any of these problems: You can't eat or drink. You have nausea and vomiting. You have watery poop (diarrhea) for 2 days or more. You have pain when you pee, or your pee smells bad. You have been sick for 2 days or more and aren't getting better. Contact your provider right away if: You have any of these coming from your vagina: Abnormal discharge. Bad-smelling fluid. Bleeding. Your baby is moving less than usual. You have signs of labor: You have any contractions, belly cramping, or have pain in your pelvis or lower back before 37 weeks of pregnancy (preterm labor). You have regular contractions that are less than 5 minutes apart. Your water breaks. You have symptoms of high blood pressure or preeclampsia. These include: A severe, throbbing headache that does not go away. Sudden or extreme swelling of your face, hands, legs, or feet. Vision problems: You see spots. You have blurry vision. Your eyes are sensitive to light. If you can't reach your provider, go to an urgent care or emergency room. Get help right away if: You faint, become  confused, or can't think clearly. You have chest pain or trouble breathing. You have any kind of injury, such as from a fall or a car crash. These symptoms may be an emergency. Call 911 right away. Do not wait to see if the symptoms will go away. Do not drive yourself to the hospital. This information is not intended to replace advice given to you by your health care provider. Make sure you discuss any questions you have with your health care provider. Document Revised: 06/20/2023 Document Reviewed: 01/18/2023 Elsevier Patient Education  2024 [employer].

## 2024-09-11 NOTE — Assessment & Plan Note (Addendum)
-  reading about childbirth, her friend Ronnald will be with her in labor, she does not want anyone else, she is comfortable laboring alone, she does not think she wants the FOB present for labor/birth, he may choose to parent after paternity testing -does not have a lot of PP support -has started Zoloft  but is not consistent  -Reassured her discomforts are a normal part of the third trimester, but she dos not need to suffer. comfort measures for back pain reviewed, referral to PT placed  -TWG 11lbs, has gained 4lbs since last visit  -warning signs reviewed

## 2024-09-20 ENCOUNTER — Ambulatory Visit: Payer: Self-pay | Admitting: Advanced Practice Midwife

## 2024-09-22 ENCOUNTER — Ambulatory Visit
Admission: RE | Admit: 2024-09-22 | Discharge: 2024-09-22 | Disposition: A | Discharge status: Home / Self Care | Payer: Self-pay | Attending: Emergency Medicine | Admitting: Emergency Medicine

## 2024-09-22 ENCOUNTER — Telehealth: Payer: Self-pay | Admitting: Emergency Medicine

## 2024-09-22 VITALS — BP 110/74 | HR 79 | Temp 98.5°F | Resp 16 | Wt 147.8 lb

## 2024-09-22 DIAGNOSIS — R0981 Nasal congestion: Secondary | ICD-10-CM | POA: Insufficient documentation

## 2024-09-22 DIAGNOSIS — J029 Acute pharyngitis, unspecified: Secondary | ICD-10-CM | POA: Insufficient documentation

## 2024-09-22 DIAGNOSIS — J069 Acute upper respiratory infection, unspecified: Secondary | ICD-10-CM | POA: Diagnosis not present

## 2024-09-22 LAB — CERVICOVAGINAL ANCILLARY ONLY
Bacterial Vaginitis (gardnerella): NEGATIVE
Candida Glabrata: NEGATIVE
Candida Vaginitis: POSITIVE — AB
Chlamydia: NEGATIVE
Comment: NEGATIVE
Comment: NEGATIVE
Comment: NEGATIVE
Comment: NEGATIVE
Comment: NEGATIVE
Comment: NORMAL
Neisseria Gonorrhea: NEGATIVE
Trichomonas: NEGATIVE

## 2024-09-22 LAB — CYTOLOGY, (ORAL, ANAL, URETHRAL) ANCILLARY ONLY
Chlamydia: NEGATIVE
Comment: NEGATIVE
Comment: NORMAL
Neisseria Gonorrhea: NEGATIVE

## 2024-09-22 LAB — POCT INFLUENZA A/B
Influenza A, POC: NEGATIVE
Influenza B, POC: NEGATIVE

## 2024-09-22 LAB — POCT RAPID STREP A (OFFICE): Rapid Strep A Screen: NEGATIVE

## 2024-09-22 LAB — POC SOFIA SARS ANTIGEN FIA: SARS Coronavirus 2 Ag: NEGATIVE

## 2024-09-22 MED ORDER — MICONAZOLE NITRATE 2 % VA CREA: 1.0000 | TOPICAL_CREAM | Freq: Every day | VAGINAL | 0 refills | Status: DC

## 2024-09-22 NOTE — ED Triage Notes (Signed)
 Patient presents to [facility] for sore throat and white spots on throat area since Friday. Also reports vaginal irritation and req STD screening. Treating with benadryl , cough drops, throat lozenges. OB instructed her to come in for testing.

## 2024-09-22 NOTE — Telephone Encounter (Signed)
 Patient's vaginal cytology swab returned positive for yeast.  Prescription for 7-day vaginal Monistat  sent to the pharmacy.
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
## 2024-09-22 NOTE — Discharge Instructions (Addendum)
 Your testing today was negative for COVID, influenza, or strep.  We are sending the strep swab for culture.  Your cytology swabs will be back tomorrow.  If you test positive for any infection you will be contacted by phone and treatment options will be provided.  If the results are negative they will appear in your MyChart.  Please continue to use over-the-counter Tylenol  according to the package instructions as needed for any pain or if you develop a fever.  You may use Flonase  nasal spray, 2 squirts in each nostril at bedtime, to help with nasal congestion.  If you develop coughing use over-the-counter cough preparations such as Delsym, Robitussin, or Zarbee's.  Follow the package instructions for dosing.  If you develop any new or worsening symptoms other return for reevaluation, follow-up with your primary care provider, or follow-up with your obstetrician.

## 2024-09-23 ENCOUNTER — Ambulatory Visit (HOSPITAL_COMMUNITY): Payer: Self-pay

## 2024-09-23 MED ORDER — CLOTRIMAZOLE 3 2 % VA CREA: 1.0000 | TOPICAL_CREAM | Freq: Every day | VAGINAL | 0 refills | Status: AC

## 2024-09-25 ENCOUNTER — Ambulatory Visit: Admitting: Certified Nurse Midwife

## 2024-09-25 ENCOUNTER — Encounter: Payer: Self-pay | Admitting: Certified Nurse Midwife

## 2024-09-25 VITALS — BP 111/72 | HR 104 | Wt 149.5 lb

## 2024-09-25 DIAGNOSIS — O2613 Low weight gain in pregnancy, third trimester: Secondary | ICD-10-CM

## 2024-09-25 DIAGNOSIS — Z3480 Encounter for supervision of other normal pregnancy, unspecified trimester: Secondary | ICD-10-CM

## 2024-09-25 DIAGNOSIS — Z3A34 34 weeks gestation of pregnancy: Secondary | ICD-10-CM | POA: Diagnosis not present

## 2024-09-25 DIAGNOSIS — Z113 Encounter for screening for infections with a predominantly sexual mode of transmission: Secondary | ICD-10-CM

## 2024-09-25 LAB — CULTURE, GROUP A STREP (THRC)

## 2024-09-25 NOTE — Progress Notes (Signed)
" ° ° °  Return Prenatal Note   Subjective   23 y.o. G1P0000 at [redacted]w[redacted]d presents for this follow-up prenatal visit.  Patient was seen in urgent care 12/25 and treated for a yeast infection with Monistat  and clotrimazole  cream.  She has not started either medication but reports she's feeling much better.  She would like to find out when she's having another ultrasound and would like to discuss her birth plan today.  Patient reports: Movement: Present Contractions: Not present  Objective   Flow sheet Vitals: Pulse Rate: (!) 104 BP: 111/72 Fundal Height: 35 cm Fetal Heart Rate (bpm): 135 Presentation: Vertex (Leopold's) Total weight gain: 13 lb 8 oz (6.124 kg)  General Appearance  No acute distress, well appearing, and well nourished Pulmonary   Normal work of breathing Neurologic   Alert and oriented to person, place, and time Psychiatric   Mood and affect within normal limits   Assessment/Plan   Plan  23 y.o. G1P0000 at [redacted]w[redacted]d presents for follow-up OB visit. Reviewed prenatal record including previous visit note.  Supervision of other normal pregnancy, antepartum Reviewed kick counts and preterm labor warning signs. Instructed to call office or come to hospital with persistent headache, vision changes, regular contractions, leaking of fluid, decreased fetal movement or vaginal bleeding.  Unsure about RSV vaccine, indications reviewed.  Birth plan reviewed, planning unmedicated, open to epidural if needed. Friend will be labor support, no visitors without password.  Low weight gain during pregnancy in third trimester Ultrasound for growth ordered, lost to 130# in 1st/early 2nd trimester. Fundal height appropriate for EGA.      Orders Placed This Encounter  Procedures   US  OB Follow Up    Standing Status:   Future    Expected Date:   10/02/2024    Expiration Date:   09/25/2025    Reason for exam::   growth, low weight gain in pregnancy    Preferred imaging location?:    Internal   No follow-ups on file.   Future Appointments  Date Time Provider Department Center  10/02/2024  2:35 PM Justino Eleanor HERO, CNM [facility] None  10/09/2024 11:15 AM [facility] US  1 [facility] None  10/09/2024  1:15 PM Jayne Harlene CROME, CNM [facility] None  10/16/2024 10:55 AM Lynda Bradley, CNM [facility] None  10/23/2024 10:55 AM Charma Domino, CNM [facility] None     For next visit:  continue with routine prenatal care     Harlene CROME Jayne, CNM  12/26/202511:16 AM  "

## 2024-09-25 NOTE — Assessment & Plan Note (Signed)
 Reviewed kick counts and preterm labor warning signs. Instructed to call office or come to hospital with persistent headache, vision changes, regular contractions, leaking of fluid, decreased fetal movement or vaginal bleeding.  Unsure about RSV vaccine, indications reviewed.  Birth plan reviewed, planning unmedicated, open to epidural if needed. Friend will be labor support, no visitors without password.

## 2024-09-25 NOTE — Assessment & Plan Note (Signed)
 Ultrasound for growth ordered, lost to 130# in 1st/early 2nd trimester. Fundal height appropriate for EGA.

## 2024-09-30 ENCOUNTER — Encounter: Payer: Self-pay | Admitting: Certified Nurse Midwife

## 2024-09-30 NOTE — Progress Notes (Unsigned)
" ° ° °  Return Prenatal Note   Assessment/Plan   Plan  23 y.o. G1P0000 at [redacted]w[redacted]d presents for follow-up OB visit. Reviewed prenatal record including previous visit note.  Supervision of other normal pregnancy, antepartum -Cephalic by [facility] today -Discussed birth plan. Desires epidural later in labor. Nitrous in early labor. Would like IV pain medicine -Discussed GBS and GC/CT swabs at next visit -Reviewed comfort measures for GERD/abdominal pain. Consider gallbladder concerns and seek emergent care for severe pain or intractable vomiting. -Reviewed kick counts and preterm labor warning signs. Instructed to call office or come to hospital with persistent headache, vision changes, regular contractions, leaking of fluid, decreased fetal movement or vaginal bleeding.     Orders Placed This Encounter  Procedures   Strep Gp B NAA    Standing Status:   Future    Expiration Date:   10/02/2025   Return in about 1 week (around 10/09/2024).   Future Appointments  Date Time Provider Department Center  10/09/2024 11:15 AM [facility] US  1 [facility] None  10/09/2024  1:15 PM Jayne Harlene CROME, CNM [facility] None  10/16/2024 10:55 AM Lynda Bradley, CNM [facility] None  10/23/2024 10:55 AM Charma Domino, CNM [facility] None    For next visit:  ROB with GBS and GC/chlamydia    Subjective  Carmen Black is feeling well overall. She reports that the other night, she had a severe abdominal pain and woke up and vomited. She is unsure if it was the baby moving or a contraction or something else. The pain was primarily epigastric. It resolved spontaneously.  Movement: Present Contractions: Irritability  Objective   Flow sheet Vitals: Pulse Rate: 87 BP: 98/64 Fundal Height: 35 cm Fetal Heart Rate (bpm): 132 Presentation: Vertex Total weight gain: 16 lb (7.258 kg)  General Appearance  No acute distress, well appearing, and well nourished Pulmonary   Normal work of breathing Neurologic   Alert and oriented to person,  place, and time Psychiatric   Mood and affect within normal limits  [name], CNM 10/02/2024 5:45 PM  "

## 2024-10-01 NOTE — L&D Delivery Note (Signed)
"     Delivery Note   Carmen Black is a 24 y.o. G1P0000 at [redacted]w[redacted]d Estimated Date of Delivery: 10/31/24  PRE-OPERATIVE DIAGNOSIS:  1) [redacted]w[redacted]d pregnancy.  2) GBS carrier   POST-OPERATIVE DIAGNOSIS:  1) [redacted]w[redacted]d pregnancy s/p Vaginal, Spontaneous Same delivered  Delivery Type: Vaginal, Spontaneous   Delivery Anesthesia: Epidural  Labor Complications:  None    ESTIMATED BLOOD LOSS: 100 ml    FINDINGS:   Information for the patient's newborn:  Carmen, Black [[id number]]  Live born female  Birth Weight:  pending per protocol APGAR: 8, 9  Newborn Delivery   Birth date/time: 10/31/2024 20:17:00 Delivery type: Vaginal, Spontaneous      SPECIMENS:   PLACENTA:   Appearance: Intact   Removal: Spontaneous     Disposition: discarded  Cord Blood: not collected, mother of baby A POS Performed at [facility], [address] [place], [place] [address]   DISPOSITION:  Infant left in stable condition in the delivery room, with L&D personnel and mother,  NARRATIVE SUMMARY: Labor course:  Carmen Black is a G1P0000 at [redacted]w[redacted]d who presented to Labor & Delivery for labor management. Her initial cervical exam was 2-3/80/-1. Labor proceeded spontaneously and she was found to be completely dilated at 1842. With excellent maternal pushing effort, she birthed a viable female infant, Carmen Black, at 2017. Head birthed LOA with anterior compound hand, restituted to LOT. There was not a nuchal cord. The shoulders were birthed without difficulty. The infant was placed skin-to-skin with mother. The cord was doubly clamped and cut when pulsations ceased. The placenta delivered spontaneously and was noted to be intact with a 3VC. A perineal and vaginal examination was performed. Episiotomy/Lacerations: Labial Lacerations were hemostatic & therefore not repaired. Tolerated well. Mother and baby left in stable condition.   Harlene LITTIE Cisco, CNM 10/31/2024 8:59 PM  "

## 2024-10-02 ENCOUNTER — Ambulatory Visit: Admitting: Obstetrics

## 2024-10-02 VITALS — BP 98/64 | HR 87 | Wt 152.0 lb

## 2024-10-02 DIAGNOSIS — Z3480 Encounter for supervision of other normal pregnancy, unspecified trimester: Secondary | ICD-10-CM

## 2024-10-02 DIAGNOSIS — Z3A35 35 weeks gestation of pregnancy: Secondary | ICD-10-CM | POA: Diagnosis not present

## 2024-10-02 DIAGNOSIS — Z3483 Encounter for supervision of other normal pregnancy, third trimester: Secondary | ICD-10-CM

## 2024-10-02 DIAGNOSIS — Z113 Encounter for screening for infections with a predominantly sexual mode of transmission: Secondary | ICD-10-CM

## 2024-10-02 DIAGNOSIS — Z3685 Encounter for antenatal screening for Streptococcus B: Secondary | ICD-10-CM

## 2024-10-02 NOTE — Assessment & Plan Note (Addendum)
-  Cephalic by [facility] today -Discussed birth plan. Desires epidural later in labor. Nitrous in early labor. Would like IV pain medicine -Discussed GBS and GC/CT swabs at next visit -Reviewed comfort measures for GERD/abdominal pain. Consider gallbladder concerns and seek emergent care for severe pain or intractable vomiting. -Reviewed kick counts and preterm labor warning signs. Instructed to call office or come to hospital with persistent headache, vision changes, regular contractions, leaking of fluid, decreased fetal movement or vaginal bleeding.

## 2024-10-09 ENCOUNTER — Ambulatory Visit (INDEPENDENT_AMBULATORY_CARE_PROVIDER_SITE_OTHER): Admitting: Certified Nurse Midwife

## 2024-10-09 ENCOUNTER — Ambulatory Visit

## 2024-10-09 VITALS — BP 103/69 | HR 78 | Wt 153.2 lb

## 2024-10-09 DIAGNOSIS — Z2911 Encounter for prophylactic immunotherapy for respiratory syncytial virus (RSV): Secondary | ICD-10-CM

## 2024-10-09 DIAGNOSIS — Z3A36 36 weeks gestation of pregnancy: Secondary | ICD-10-CM | POA: Diagnosis not present

## 2024-10-09 DIAGNOSIS — Z3685 Encounter for antenatal screening for Streptococcus B: Secondary | ICD-10-CM

## 2024-10-09 DIAGNOSIS — Z3403 Encounter for supervision of normal first pregnancy, third trimester: Secondary | ICD-10-CM

## 2024-10-09 DIAGNOSIS — Z23 Encounter for immunization: Secondary | ICD-10-CM

## 2024-10-09 DIAGNOSIS — O2613 Low weight gain in pregnancy, third trimester: Secondary | ICD-10-CM

## 2024-10-09 DIAGNOSIS — Z3480 Encounter for supervision of other normal pregnancy, unspecified trimester: Secondary | ICD-10-CM

## 2024-10-09 DIAGNOSIS — Z113 Encounter for screening for infections with a predominantly sexual mode of transmission: Secondary | ICD-10-CM

## 2024-10-09 NOTE — Patient Instructions (Addendum)
 RSV Vaccine: What You Need to Know Many vaccine information statements are available in Spanish and other languages. See classthemes.se. 1. Why get vaccinated? RSV vaccine can prevent lower respiratory tract disease caused by respiratory syncytial virus (RSV). RSV is a common respiratory virus that usually causes mild, cold-like symptoms. RSV can cause illness in people of all ages but may be especially serious for infants and older adults. RSV is the most common cause of hospitalization in [place] infants. Infants up to 37 months of age (especially those 6 months and younger) and children who were born prematurely, or who have chronic lung or heart disease, or a weakened immune system, are at increased risk of severe RSV disease. RSV infections can be dangerous for certain adults. Adults at highest risk for severe RSV disease include older adults, especially those with chronic heart or lung disease, a weakened immune system, certain other chronic medical conditions, or who live in nursing homes. RSV spreads through direct contact with the virus, such as when droplets from an infected person's cough or sneeze contact your eyes, nose, or mouth. It can also be spread by someone touching a surface, such as a doorknob, that has the virus on it, and then touching your face. Symptoms of RSV infection may include runny nose, decreased appetite, coughing, sneezing, fever, or wheezing. In very young infants, symptoms of RSV may also include irritability (fussiness), decreased activity, or apnea (pauses in breathing for more than 10 seconds). Most people recover in a week or two, but RSV can be more serious, resulting in shortness of breath and low oxygen levels. RSV can cause bronchiolitis (inflammation of the small airways in the lung) and pneumonia (infection of the lungs). RSV can also lead to worsening of other medical conditions such as asthma, chronic obstructive pulmonary disease (a chronic disease of the  lungs that makes it hard to breathe), or heart failure (when the heart cannot pump enough blood and oxygen throughout the body). Infants and older adults who get very sick from RSV may need to be hospitalized. Some may even die. 2. RSV vaccine There are two immunization options available for protecting infants against RSV: maternal vaccine for the pregnant person or preventive antibodies given to the baby. Only one of these options is needed for most babies to be protected. CDC recommends a one-time dose of RSV vaccine for pregnant people from week 32 through week 36 of pregnancy for the prevention of RSV disease in their infants during the first 6 months of life. This vaccine is recommended to be given from September through January for most of the [place] . However, in some locations (for example, the territories, [place] , [place] , and parts of [place] ), the timing of vaccination may differ based on the time of year when RSV circulates in the area. CDC recommends a one-time-dose of RSV vaccine for everyone 75 years and older and for adults 43 through 25 years of age who are at increased risk of severe RSV disease. Adults 67 through 25 years old who are at increased risk include those with chronic heart or lung disease, a weakened immune system, or certain other chronic medical conditions, and those who are residents of nursing homes. RSV vaccine may be given at the same time as other vaccines. 3. Talk with your health care provider Tell your vaccination provider if the person getting the vaccine: Has had an allergic reaction after a previous dose of RSV vaccine, or has any severe, life-threatening allergies In some cases, your health  care provider may decide to postpone RSV vaccination until a future visit.  People with minor illnesses, such as a cold, may be vaccinated. People who are moderately or severely ill should usually wait until they recover before getting RSV vaccine.  Your health care  provider can give you more information. 4. Risks of a vaccine reaction Pain, redness, and swelling where the shot is given, fatigue (feeling tired), fever, headache, nausea, diarrhea, and muscle or joint pain can happen after RSV vaccination. Serious neurologic conditions, including Guillain-Barr syndrome (GBS), have been reported after RSV vaccination in some older adults. At this time, an increased risk of GBS following RSV vaccine among persons aged 60 years and older cannot be confirmed or ruled out. Preterm birth and high blood pressure during pregnancy, including pre-eclampsia, have been reported among pregnant people who received RSV vaccine. It is unclear whether these events were caused by the vaccine. People sometimes faint after medical procedures, including vaccination. Tell your provider if you feel dizzy or have vision changes or ringing in the ears.  As with any medicine, there is a very remote chance of a vaccine causing a severe allergic reaction, other serious injury, or death. V-Safe is a safety monitoring system that lets you share with CDC how you, or your dependent, feel after getting RSV vaccine. You can find information and enroll in V-Safe radarlocations.no. 5. What if there is a serious problem? An allergic reaction could occur after the vaccinated person leaves the clinic. If you see signs of a severe allergic reaction (hives, swelling of the face and throat, difficulty breathing, a fast heartbeat, dizziness, or weakness), call 9-1-1 and get the person to the nearest hospital. For other signs that concern you, call your health care provider.  Adverse reactions should be reported to the Vaccine Adverse Event Reporting System (VAERS). Your health care provider will usually file this report, or you can do it yourself. Visit the VAERS website at www.vaers.lagents.no or call 740-857-3265. VAERS is only for reporting reactions, and VAERS staff members do not give medical advice. 6. How  can I learn more? Ask your health care provider. Call your local or state health department. Visit the website of the Food and Drug Administration (FDA) for vaccine package inserts and additional information at finderlist.no Contact the Centers for Disease Control and Prevention (CDC): Call 320-377-9766 (1-800-CDC-INFO) or Visit CDC's vaccine website at piccapture.uy Source: CDC Vaccine Information Statement RSV (Respiratory Syncytial Virus) Vaccine (07/18/2023) This same material is available at tonerpromos.no for no charge. This information is not intended to replace advice given to you by your health care provider. Make sure you discuss any questions you have with your health care provider. Document Revised: 08/13/2023 Document Reviewed: 10/03/2022 Elsevier Patient Education  2024 [employer].Group B Streptococcus Test During Pregnancy Why am I having this test? Routine testing, also called screening, for group B streptococcus (GBS) is recommended for all pregnant women between the 36th and 37th week of pregnancy. GBS is a type of bacteria that can be passed from mother to baby during childbirth. Screening will help guide whether or not you will need treatment during labor and delivery to prevent complications such as: An infection in your uterus during labor. An infection in your uterus after delivery. A serious infection in your baby after delivery, such as pneumonia, meningitis, or sepsis. GBS screening is not often done before 36 weeks of pregnancy unless you go into labor prematurely. What happens if I have group B streptococcus? If testing shows  that you have GBS, your health care provider will recommend treatment with IV antibiotics during labor and delivery. This treatment significantly decreases the risk of complications for you and your baby. If you have a planned C-section and you have GBS, you may not need to be treated with antibiotics because  GBS is usually passed to babies after labor starts and your water breaks. If you are in labor or your water breaks before your C-section, it is possible for GBS to get into your uterus and be passed to your baby, so you might need treatment. Is there a chance I may not need to be tested? You may not need to be tested for GBS if: You have a urine test that shows GBS before 36 to 37 weeks. You had a baby with GBS infection after a previous delivery. In these cases, you will automatically be treated for GBS during labor and delivery. What is being tested? This test is done to check if you have group B streptococcus in your vagina or rectum. What kind of sample is taken? To collect samples for this test, your health care provider will swab your vagina and rectum with a cotton swab. The sample is then sent to the lab to see if GBS is present. What happens during the test?  You will remove your clothing from the waist down. You will lie down on an exam table in the same position as you would for a pelvic exam. Your health care provider will swab your vagina and rectum to collect samples for a culture test. You will be able to go home after the test and do all your usual activities. How are the results reported? The test results are reported as positive or negative. What do the results mean? A positive test means you are at risk for passing GBS to your baby during labor and delivery. Your health care provider will recommend that you are treated with an IV antibiotic during labor and delivery. A negative test means you are at very low risk of passing GBS to your baby. There is still a low risk of passing GBS to your baby because sometimes test results may report that you do not have a condition when you do (false-negative result) or there is a chance that you may become infected with GBS after the test is done. You most likely will not need to be treated with an antibiotic during labor and  delivery. Talk with your health care provider about what your results mean. Questions to ask your health care provider Ask your health care provider, or the department that is doing the test: When will my results be ready? How will I get my results? What are my treatment options? Summary Routine testing (screening) for group B streptococcus (GBS) is recommended for all pregnant women between the 36th and 37th week of pregnancy. GBS is a type of bacteria that can be passed from mother to baby during childbirth. If testing shows that you have GBS, your health care provider will recommend that you are treated with IV antibiotics during labor and delivery. This treatment almost always prevents infection in newborns. This information is not intended to replace advice given to you by your health care provider. Make sure you discuss any questions you have with your health care provider. Document Revised: 09/03/2022 Document Reviewed: 09/03/2022 Elsevier Patient Education  2024 Elsevier Inc.Signs and Symptoms of Labor Labor is the body's natural process of moving the baby and the placenta out of  the uterus. The process of labor usually starts when the baby is full-term, between 58 and 41 weeks of pregnancy. Signs and symptoms that you are close to going into labor As your body prepares for labor and the birth of your baby, you may notice the following symptoms in the weeks and days before true labor starts: Passing a small amount of thick, bloody mucus from your vagina. This is called normal bloody show or losing your mucus plug. This may happen more than a week before labor begins, or right before labor begins, as the opening of the cervix starts to widen (dilate). For some women, the entire mucus plug passes at once. For others, pieces of the mucus plug may gradually pass over several days. Your baby moving (dropping) lower in your pelvis to get into position for birth (lightening). When this happens,  you may feel more pressure on your bladder and pelvic bone and less pressure on your ribs. This may make it easier to breathe. It may also cause you to need to urinate more often and have problems with bowel movements. Having practice contractions, also called Braxton Hicks contractions or false labor. These occur at irregular (unevenly spaced) intervals that are more than 10 minutes apart. False labor contractions are common after exercise or sexual activity. They will stop if you change position, rest, or drink fluids. These contractions are usually mild and do not get stronger over time. They may feel like: A backache or back pain. Mild cramps, similar to menstrual cramps. Tightening or pressure in your abdomen. Other early symptoms include: Nausea or loss of appetite. Diarrhea. Having a sudden burst of energy, or feeling very tired. Mood changes. Having trouble sleeping. Signs and symptoms that labor has begun Signs that you are in labor may include: Having contractions that come at regular (evenly spaced) intervals and increase in intensity. This may feel like more intense tightening or pressure in your abdomen that moves to your back. Contractions may also feel like rhythmic pain in your upper thighs or back that comes and goes at regular intervals. If you are delivering for the first time, this change in intensity of contractions often occurs at a more gradual pace. If you have given birth before, you may notice a more rapid progression of contraction changes. Feeling pressure in the vaginal area. Your water breaking (rupture of membranes). This is when the sac of fluid that surrounds your baby breaks. Fluid leaking from your vagina may be clear or blood-tinged. Labor usually starts within 24 hours of your water breaking, but it may take longer to begin. Some people may feel a sudden gush of fluid; others may notice repeatedly damp underwear. Follow these instructions at home:  When  labor starts, or if your water breaks, call your health care provider or nurse care line. Based on your situation, they will determine when you should go in for an exam. During early labor, you may be able to rest and manage symptoms at home. Some strategies to try at home include: Breathing and relaxation techniques. Taking a warm bath or shower. Listening to music. Using a heating pad on the lower back for pain. If directed, apply heat to the area as often as told by your health care provider. Use the heat source that your health care provider recommends, such as a moist heat pack or a heating pad. Place a towel between your skin and the heat source. Leave the heat on for 20-30 minutes. Remove the heat if  your skin turns bright red. This is especially important if you are unable to feel pain, heat, or cold. You have a greater risk of getting burned. Contact a health care provider if: Your labor has started. Your water breaks. You have nausea, vomiting, or diarrhea. Get help right away if: You have painful, regular contractions that are 5 minutes apart or less. Labor starts before you are [redacted] weeks along in your pregnancy. You have a fever. You have bright red blood coming from your vagina. You do not feel your baby moving. You have a severe headache with or without vision problems. You have chest pain or shortness of breath. These symptoms may represent a serious problem that is an emergency. Do not wait to see if the symptoms will go away. Get medical help right away. Call your local emergency services (911 in the [place] Do not drive yourself to the hospital. Summary Labor is your body's natural process of moving your baby and the placenta out of your uterus. The process of labor usually starts when your baby is full-term, between 73 and 40 weeks of pregnancy. When labor starts, or if your water breaks, call your health care provider or nurse care line. Based on your situation, they will  determine when you should go in for an exam. This information is not intended to replace advice given to you by your health care provider. Make sure you discuss any questions you have with your health care provider. Document Revised: 01/31/2021 Document Reviewed: 01/31/2021 Elsevier Patient Education  2024 [employer]. Third Trimester of Pregnancy  The third trimester of pregnancy is from week 28 through week 40. This is months 7 through 9. The third trimester is a time when your baby is growing fast. Body changes during your third trimester Your body continues to change during this time. The changes usually go away after your baby is born. Physical changes You will continue to gain weight. You may get stretch marks on your hips, belly, and breasts. Your breasts will keep growing and may hurt. A yellow fluid (colostrum) may leak from your breasts. This is the first milk you're making for your baby. Your hair may grow faster and get thicker. In some cases, you may get hair loss. Your belly button may stick out. You may have more swelling in your hands, face, or ankles. Health changes You may have heartburn. You may feel short of breath. This is caused by the uterus that is now bigger. You may have more aches in the pelvis, back, or thighs. You may have more tingling or numbness in your hands, arms, and legs. You may pee more often. You may have trouble pooping (constipation) or swollen veins in the butt that can itch or get painful (hemorrhoids). Other changes You may have more problems sleeping. You may notice the baby moving lower in your belly (dropping). You may have more fluid coming from your vagina. Your joints may feel loose, and you may have pain around your pelvic bone. Follow these instructions at home: Medicines Take medicines only as told by your health care provider. Some medicines are not safe during pregnancy. Your provider may change the medicines that you take. Do  not take any medicines unless told to by your provider. Take a prenatal vitamin that has at least 600 micrograms (mcg) of folic acid. Do not use herbal medicines, illegal drugs, or medicines that are not approved by your provider. Eating and drinking While you're pregnant your body needs additional  nutrition to help support your growing baby. Talk with your provider about your nutritional needs. Activity Most women are able to exercise regularly during pregnancy. Exercise routines may need to change at the end of your pregnancy. Talk to your provider about your activities and exercise routine. Relieving pain and discomfort Rest often with your legs raised if you have leg cramps or low back pain. Take warm sitz baths to soothe pain from hemorrhoids. Use hemorrhoid cream if your provider says it's okay. Wear a good, supportive bra if your breasts hurt. Do not use hot tubs, steam rooms, or saunas. Do not douche. Do not use tampons or scented pads. Safety Talk to your provider before traveling far distances. Wear your seatbelt at all times when you're in a car. Talk to your provider if someone hits you, hurts you, or yells at you. Preparing for birth To prepare for your baby: Take childbirth and breastfeeding classes. Visit the hospital and tour the maternity area. Buy a rear-facing car seat. Learn how to install it in your car. General instructions Avoid cat litter boxes and soil used by cats. These things carry germs that can cause harm to your pregnancy and your baby. Do not drink alcohol, smoke, vape, or use products with nicotine  or tobacco in them. If you need help quitting, talk with your provider. Keep all follow-up visits for your third trimester. Your provider will do more exams and tests during this trimester. Write down your questions. Take them to your prenatal visits. Your provider also will: Talk with you about your overall health. Give you advice or refer you to specialists  who can help with different needs, including: Mental health and counseling. Foods and healthy eating. Ask for help if you need help with food. Where to find more information American Pregnancy Association: americanpregnancy.org [employer]: acog.org Office on [employer] Health: travellesson.ca Contact a health care provider if: You have a headache that does not go away when you take medicine. You have any of these problems: You can't eat or drink. You have nausea and vomiting. You have watery poop (diarrhea) for 2 days or more. You have pain when you pee, or your pee smells bad. You have been sick for 2 days or more and aren't getting better. Contact your provider right away if: You have any of these coming from your vagina: Abnormal discharge. Bad-smelling fluid. Bleeding. Your baby is moving less than usual. You have signs of labor: You have any contractions, belly cramping, or have pain in your pelvis or lower back before 37 weeks of pregnancy (preterm labor). You have regular contractions that are less than 5 minutes apart. Your water breaks. You have symptoms of high blood pressure or preeclampsia. These include: A severe, throbbing headache that does not go away. Sudden or extreme swelling of your face, hands, legs, or feet. Vision problems: You see spots. You have blurry vision. Your eyes are sensitive to light. If you can't reach your provider, go to an urgent care or emergency room. Get help right away if: You faint, become confused, or can't think clearly. You have chest pain or trouble breathing. You have any kind of injury, such as from a fall or a car crash. These symptoms may be an emergency. Call 911 right away. Do not wait to see if the symptoms will go away. Do not drive yourself to the hospital. This information is not intended to replace advice given to you by your health care provider. Make  sure you discuss any  questions you have with your health care provider. Document Revised: 06/20/2023 Document Reviewed: 01/18/2023 Elsevier Patient Education  2024 [employer] Inc. Group B Streptococcus Infection During Pregnancy Group B Streptococcus (GBS) is a type of bacteria that is often found in healthy people. It is commonly found in the rectum, vagina, and intestines. In people who are healthy and not pregnant, the bacteria rarely cause serious illness or complications. However, women who test positive for GBS during pregnancy can pass the bacteria to the baby during childbirth. This can cause serious infection in the baby after birth. Women with GBS may also have infections during their pregnancy or soon after childbirth. The infections include urinary tract infections (UTIs) or infections of the uterus. GBS also increases a woman's risk of complications during pregnancy, such as early labor or delivery, miscarriage, or stillbirth. Routine testing for GBS is recommended for all pregnant women. What are the causes? This condition is caused by bacteria called Streptococcus agalactiae. What increases the risk? You may have a higher risk for GBS infection during pregnancy if you had one during a past pregnancy. What are the signs or symptoms? In most cases, GBS infection does not cause symptoms in pregnant women. If symptoms exist, they may include: Labor that starts before the 37th week of pregnancy. A UTI or bladder infection. This may cause a fever, frequent urination, or pain and burning during urination. Fever during labor. There can also be a rapid heartbeat in the mother or baby. Rare but serious symptoms of a GBS infection in women include: Blood infection (septicemia). This may cause fever, chills, or confusion. Lung infection (pneumonia). This may cause fever, chills, cough, rapid breathing, chest pain, or difficulty breathing. Bone, joint, skin, or soft tissue infection. How is this diagnosed? You may  be screened for GBS between week 35 and week 37 of pregnancy. If you have symptoms of preterm labor, you may be screened earlier. This condition is diagnosed based on lab test results from: A swab of fluid from the vagina and rectum. A urine sample. How is this treated? This condition is treated with antibiotic medicine. Antibiotic medicine may be given: To you when you go into labor, or as soon as your water breaks. The medicines will continue until after you give birth. If you are having a cesarean delivery, you do not need antibiotics unless your water has broken. To your baby, if he or she requires treatment. Your health care provider will check your baby to decide if he or she needs antibiotics to prevent a serious infection. Follow these instructions at home: Take over-the-counter and prescription medicines only as told by your health care provider. Take your antibiotic medicine as told by your health care provider. Do not stop taking the antibiotic even if you start to feel better. Keep all pre-birth (prenatal) visits and follow-up visits as told by your health care provider. This is important. Contact a health care provider if: You have pain or burning when you urinate. You have to urinate more often than usual. You have a fever or chills. You develop a bad-smelling vaginal discharge. Get help right away if: Your water breaks. You go into labor. You have severe pain in your abdomen. You have difficulty breathing. You have chest pain. These symptoms may represent a serious problem that is an emergency. Do not wait to see if the symptoms will go away. Get medical help right away. Call your local emergency services (911 in the [place] Do  not drive yourself to the hospital. Summary GBS is a type of bacteria that is common in healthy people. During pregnancy, colonization with GBS can cause serious complications for you or your baby. Your health care provider will screen you between 35  and 37 weeks of pregnancy to determine if you are colonized with GBS. If you are colonized with GBS during pregnancy, your health care provider will recommend antibiotics through an IV during labor. After delivery, your baby will be evaluated for complications related to potential GBS infection and may require antibiotics to prevent a serious infection. This information is not intended to replace advice given to you by your health care provider. Make sure you discuss any questions you have with your health care provider. Document Revised: 09/03/2022 Document Reviewed: 09/03/2022 Elsevier Patient Education  2024 Else[employer] Inc. Problems to Watch for During Pregnancy During pregnancy, your body goes through many changes. Some changes may be uncomfortable. But most changes are not a serious problem. It's important to learn when certain signs and symptoms may be a problem. Talk with your health care provider about any medical conditions you have. Make sure you know the symptoms to watch for. Reporting problems early will prevent complications. Problems to watch for during pregnancy You're more likely to get an infection during pregnancy. Let your provider know if you have signs of infection, such as: A fever. A bad-smelling fluid from your vagina. Peeing too often, wanting to pee urgently, or pain when you pee. Also, let your provider know if: You're very tired, you feel dizzy, or you faint. You have watery poop (diarrhea) for 24 hours or longer. You throw up or feel like throwing up for 24 hours or longer. You have cramping in your belly or have pain in your hips or lower back. You have spotting, bleeding, or leaking of fluid from your vagina. You have pain, swelling, or redness in an arm or leg. You should also watch for signs of high blood pressure and preeclampsia. These signs can be very serious. They include: A headache that doesn't go away when you take medicine. Sudden or very bad swelling  of your face, hands, legs, or feet. Problems seeing, such as: You see spots. You have blurry vision. You may be sensitive to light. Why it's important to watch for these problems Watching and reporting problems to your provider can help prevent complications that may affect you and your baby. These include: Higher risk of giving birth early. Infection that may be passed on to your baby. Higher risk for stillbirth. Follow these instructions at home:  Take your medicines only as told. Keep all follow-up visits. Your provider needs to monitor your health and your baby's health. Where to find more information To learn more, go to these websites: Centers for Disease Control and Prevention (CDC) at tonerpromos.no. Then: Click Health Topics A-Z. Type urgent maternal warning signs in the search box. [employer] (ACOG): acog.org Contact a health care provider if: You have any problems while you're pregnant. You feel your baby moving less than usual. You have any of these things: You have strong emotions, such as sadness or anxiety, that affect your daily life. You do not feel safe in your home. You use tobacco, alcohol, or drugs, and you need help to stop. Get help right away if: You faint, have a seizure, or cannot think clearly. You have chest pain or difficulty breathing. You have any of the following symptoms and you were unable  to reach your provider: You have symptoms of infection, including a fever, or have vaginal bleeding. You have symptoms of high blood pressure or preeclampsia. You have signs or symptoms of labor before 37 weeks of pregnancy. These include: Contractions that are 5 minutes or less apart, or that increase in frequency, intensity, or length. Sudden, sharp pain in the belly, or low back pain. Any amount of fluid that flows from your vagina without stopping. These symptoms may be an emergency. Call 911 right away. Do not wait to  see if the symptoms will go away. Do not drive yourself to the hospital. This information is not intended to replace advice given to you by your health care provider. Make sure you discuss any questions you have with your health care provider. Document Revised: 02/26/2023 Document Reviewed: 02/26/2023 Elsevier Patient Education  2024 [employer].

## 2024-10-09 NOTE — Progress Notes (Unsigned)
" ° ° °  Return Prenatal Note   Subjective   24 y.o. G1P0000 at [redacted]w[redacted]d presents for this follow-up prenatal visit.  Patient feeling well, active baby. Ultrasound today, EFW reassuring on prelim report at 36%, nl AFI & vertex Patient reports: Movement: Present Contractions: Not present  Objective   Flow sheet Vitals: Pulse Rate: 78 BP: 103/69 Fundal Height: 36 cm Fetal Heart Rate (bpm): 140 Presentation: Vertex Total weight gain: 17 lb 3.2 oz (7.802 kg)  General Appearance  No acute distress, well appearing, and well nourished Pulmonary   Normal work of breathing Neurologic   Alert and oriented to person, place, and time Psychiatric   Mood and affect within normal limits   Assessment/Plan   Plan  24 y.o. G1P0000 at [redacted]w[redacted]d presents for follow-up OB visit. Reviewed prenatal record including previous visit note.  Supervision of other normal pregnancy, antepartum Reviewed labor warning signs and expectations for birth. Instructed to call office or come to hospital with persistent headache, vision changes, regular contractions, leaking of fluid, decreased fetal movement or vaginal bleeding. GBS & GC/CT collected. Prelim u/s report reviewed, EFW 36%, vertex.      Orders Placed This Encounter  Procedures   Strep Gp B NAA   Respiratory syncytial virus vaccine, preF, subunit, bivalent,(Abrysvo)   Return in 1 week (on 10/16/2024) for ROB.   Future Appointments  Date Time Provider Department Center  10/16/2024 10:55 AM Lynda Bradley, CNM [facility] None  10/23/2024 10:55 AM Charma Domino, CNM [facility] None    For next visit:  continue with routine prenatal care     Harlene LITTIE Cisco, CNM  1/9/20261:41 PM  "

## 2024-10-10 NOTE — H&P (Signed)
 Obstetrical Triage Note  Chief Complaint: Abdominal Pain (Pelvic pain and pressure)  Gestational Age: 25 weeks  Pregnancy Complications:  No pregnancy complications identified  Obstetrical History   # 1 - Date: None, Sex: None, Weight: None, GA: None, Type: None, Apgar1: None, Apgar5: None, Living: None, Birth Comments: None    History Of Present Illness Carmen Black is a 24 y.o. G1, P0 with intrauterine pregnancy at [redacted] weeks gestation with prenatal care with Jolynn Pack who was here locally attending a birthday party for her friend's daughter and started experiencing pelvic pain and pressure.  Patient states that most of her discomfort is in her vagina-she feels like it is opening up.Patient called her providers and was directed to come to the nearest hospital.  She denies contractions.  She denies any vaginal bleeding or leaking of fluid.  Reports good fetal movement.  Denies any signs or symptoms of preeclampsia.  Prenatal course reported uncomplicated.   Past Medical History She has no past medical history on file.  Surgical History She has no past surgical history on file.   Social History She reports that she has quit smoking. Her smoking use included cigars. She has never used smokeless tobacco. She reports that she does not currently use alcohol. She reports that she does not currently use drugs after having used the following drugs: Marijuana. Frequency: 23.00 times per week.  Allergies Patient has no known allergies.  Medications Prescriptions Prior to Admission[1]  Review of Systems Negative x10 systems other than as noted in the HPI and for typical pregnancy discomfort and pregnancy  related weight gain over the last several months.   Physical Exam  General:  Alert and oriented. NAD Eye:  Pupils are equal, round and reactive to light.   HENT:  Normocephalic.   Respiratory:  Respirations are non-labored.   Cardiovascular:  No edema.   Gastrointestinal:  Soft,  Non-tender, Non-distended, gravid.   Obstetric Exam    Toco: Acontractile Uterus: consistent with gestational age, not tender.    Fetal evaluation: heart tones: 140  bpm, variability moderate (6-25 bpm over baseline), accelerations present (greater than 15 bpm over baseline for 15 seconds but less than 2 minutes), decelerations absent, reassuring heart rate.    Cervix: cl/th/-1   Speculum: deferred Musculoskeletal:  Normal gait.   Integumentary:  Warm, Dry, No rash.   Neurologic:  No focal defects, Normal deep tendon reflexes.   Psychiatric:  Cooperative, Appropriate mood & affect.     Last Recorded Vitals Blood pressure 118/73, pulse 103, temperature 98.4 F (36.9 C), temperature source Oral, resp. rate 18, last menstrual period 01/25/2024.  Relevant Results Lab Results (last 24 hours)     ** No results found for the last 24 hours. **         Assessment/Plan Active Problems:   [redacted] weeks gestation of pregnancy (CMD)  Patient with discomforts of pregnancy.  No evidence of labor.  Cervix is closed.  She is acontractile. Fetal heart tracing is reassuring. Discharge home Follow-up with her providers as scheduled. Routine labor and delivery labor, SROM, vaginal bleeding PIH and fetal kick count precautions. All questions answered. Patient agrees with plan of care.       [1] Medications Prior to Admission  Medication Sig Dispense Refill Last Dose/Taking   ondansetron  (ZOFRAN -ODT) 4 mg disintegrating tablet Dissolve 4 mg on tongue as needed for nausea.   10/09/2024   sertraline  (ZOLOFT ) 50 mg tablet Take 25 mg by mouth daily.   Past Week  albuterol  HFA (PROVENTIL  HFA;VENTOLIN  HFA;PROAIR  HFA) 90 mcg/actuation inhaler Inhale 2 puffs as needed for shortness of breath.   More than a month

## 2024-10-11 ENCOUNTER — Ambulatory Visit: Payer: Self-pay | Admitting: Certified Nurse Midwife

## 2024-10-11 LAB — STREP GP B NAA: Strep Gp B NAA: POSITIVE — AB

## 2024-10-11 NOTE — Assessment & Plan Note (Signed)
 Reviewed labor warning signs and expectations for birth. Instructed to call office or come to hospital with persistent headache, vision changes, regular contractions, leaking of fluid, decreased fetal movement or vaginal bleeding. GBS & GC/CT collected. Prelim u/s report reviewed, EFW 36%, vertex.

## 2024-10-12 ENCOUNTER — Telehealth: Payer: Self-pay

## 2024-10-12 NOTE — Telephone Encounter (Signed)
 SABRA

## 2024-10-13 LAB — CERVICOVAGINAL ANCILLARY ONLY
Chlamydia: NEGATIVE
Comment: NEGATIVE
Comment: NORMAL
Neisseria Gonorrhea: NEGATIVE

## 2024-10-16 ENCOUNTER — Ambulatory Visit: Admitting: Obstetrics

## 2024-10-16 ENCOUNTER — Encounter: Admitting: Advanced Practice Midwife

## 2024-10-16 VITALS — BP 117/77 | HR 109 | Wt 157.0 lb

## 2024-10-16 DIAGNOSIS — Z3A37 37 weeks gestation of pregnancy: Secondary | ICD-10-CM

## 2024-10-16 DIAGNOSIS — Z3403 Encounter for supervision of normal first pregnancy, third trimester: Secondary | ICD-10-CM

## 2024-10-16 DIAGNOSIS — Z3483 Encounter for supervision of other normal pregnancy, third trimester: Secondary | ICD-10-CM | POA: Diagnosis not present

## 2024-10-16 DIAGNOSIS — M549 Dorsalgia, unspecified: Secondary | ICD-10-CM

## 2024-10-16 DIAGNOSIS — Z3480 Encounter for supervision of other normal pregnancy, unspecified trimester: Secondary | ICD-10-CM

## 2024-10-16 MED ORDER — OMEPRAZOLE MAGNESIUM 20 MG PO TBEC: 20.0000 mg | DELAYED_RELEASE_TABLET | Freq: Every day | ORAL | 1 refills | Status: DC

## 2024-10-16 NOTE — Assessment & Plan Note (Addendum)
-  Reviewed GBS positive results and recommendation for treatment in labor -Referral sent to chiro -Rx sent for omeprazole  -Discussed timing of postdates IOL. Recommended at 41 weeks. Reviewed postdates antenatal surveillance at 40+ weeks and if she chooses to await spontaneous labor past 41 weeks. -Reviewed labor warning signs. Instructed to call office or come to hospital with persistent headache, vision changes, regular contractions, leaking of fluid, decreased fetal movement or vaginal bleeding.

## 2024-10-16 NOTE — Progress Notes (Signed)
" ° ° °  Return Prenatal Note   Assessment/Plan   Plan  24 y.o. G1P0000 at [redacted]w[redacted]d presents for follow-up OB visit. Reviewed prenatal record including previous visit note.  Supervision of other normal pregnancy, antepartum -Reviewed GBS positive results and recommendation for treatment in labor -Referral sent to chiro -Rx sent for omeprazole  -Discussed timing of postdates IOL. Recommended at 41 weeks. Reviewed postdates antenatal surveillance at 40+ weeks and if she chooses to await spontaneous labor past 41 weeks. -Reviewed labor warning signs. Instructed to call office or come to hospital with persistent headache, vision changes, regular contractions, leaking of fluid, decreased fetal movement or vaginal bleeding.     Orders Placed This Encounter  Procedures   Ambulatory referral to Chiropractic    Referral Priority:   Routine    Referral Type:   Chiropractic    Referral Reason:   Specialty Services Required    Requested Specialty:   Chiropractic Medicine    Number of Visits Requested:   1   Return in about 1 week (around 10/23/2024).   Future Appointments  Date Time Provider Department Center  10/23/2024 10:55 AM Carmen Black, CNM [facility] None    For next visit:  Routine prenatal care    Subjective   Carmen Black was seen recently on L&D at [facility] during her trip to [place]. She had a long day on her feet, and then began having back pain, nausea, and vomiting. She had contractions but her cervix remained closed. She is wondering what the recommendation is if she goes past her due date.  Movement: Present Contractions: Not present  Objective   Flow sheet Vitals: Pulse Rate: (!) 109 BP: 117/77 Fundal Height: 37 cm Fetal Heart Rate (bpm): 144 Total weight gain: 21 lb (9.526 kg)  General Appearance  No acute distress, well appearing, and well nourished Pulmonary   Normal work of breathing Neurologic   Alert and oriented to person, place, and time Psychiatric   Mood and  affect within normal limits  Carmen Black, CNM 10/16/24 2:58 PM  "

## 2024-10-22 ENCOUNTER — Encounter: Payer: Self-pay | Admitting: Obstetrics

## 2024-10-22 ENCOUNTER — Telehealth: Payer: Self-pay | Admitting: Pharmacy Technician

## 2024-10-22 ENCOUNTER — Other Ambulatory Visit (HOSPITAL_COMMUNITY): Payer: Self-pay

## 2024-10-22 ENCOUNTER — Ambulatory Visit: Admitting: Obstetrics

## 2024-10-22 VITALS — BP 123/74 | HR 102 | Wt 159.0 lb

## 2024-10-22 DIAGNOSIS — Z3480 Encounter for supervision of other normal pregnancy, unspecified trimester: Secondary | ICD-10-CM

## 2024-10-22 DIAGNOSIS — Z3483 Encounter for supervision of other normal pregnancy, third trimester: Secondary | ICD-10-CM

## 2024-10-22 DIAGNOSIS — Z3A38 38 weeks gestation of pregnancy: Secondary | ICD-10-CM

## 2024-10-22 NOTE — Assessment & Plan Note (Signed)
-  Encouraged daily omeprazole , bland diet, fluids as tolerated -Link sent to [employer] [employer] -Discussed antenatal surveillance after 40 weeks -Reviewed labor warning signs. Instructed to call office or come to hospital with persistent headache, vision changes, regular contractions, leaking of fluid, decreased fetal movement or vaginal bleeding.

## 2024-10-22 NOTE — Telephone Encounter (Signed)
 Thank you. PA has been closed.

## 2024-10-22 NOTE — Telephone Encounter (Signed)
 Pharmacy Patient Advocate Encounter   Received notification from Cc'd charts that prior authorization for Omeprazole  20mg  tablets is required/requested.   Insurance verification completed.   The patient is insured through [employer].   Per test claim:  Omeprazole  20mg  capsules is preferred by the insurance.  If suggested medication is appropriate, Please send in a new RX and discontinue this one. If not, please advise as to why it's not appropriate so that we may request a Prior Authorization. Please note, some preferred medications may still require a PA.  If the suggested medications have not been trialed and there are no contraindications to their use, the PA will not be submitted, as it will not be approved. Archived Key:

## 2024-10-22 NOTE — Progress Notes (Signed)
 error

## 2024-10-22 NOTE — Progress Notes (Signed)
" ° ° °  Return Prenatal Note   Assessment/Plan   Plan  24 y.o. G1P0000 at [redacted]w[redacted]d presents for follow-up OB visit. Reviewed prenatal record including previous visit note.  Supervision of other normal pregnancy, antepartum -Encouraged daily omeprazole , bland diet, fluids as tolerated -Link sent to [employer] -Discussed antenatal surveillance after 40 weeks -Reviewed labor warning signs. Instructed to call office or come to hospital with persistent headache, vision changes, regular contractions, leaking of fluid, decreased fetal movement or vaginal bleeding.      No orders of the defined types were placed in this encounter.  Return in about 1 year (around 10/22/2025).   Future Appointments  Date Time Provider Department Center  10/29/2024  2:35 PM Carmen Black, CNM [facility] None    For next visit:  Routine prenatal care    Subjective   Carmen Black has been nauseated and vomiting the past 3 days. She has been trying bland foods. She has Zofran  at home, which helps some. She is having frequent BH ctx. Baby feels OP/OT  Movement: Present Contractions: Irregular  Objective   Flow sheet Vitals: Pulse Rate: (!) 102 BP: 123/74 Fundal Height: 37 cm Fetal Heart Rate (bpm): 144 Dilation: Closed Effacement (%): 30 Station: -2 Total weight gain: 23 lb (10.4 kg)  General Appearance  No acute distress, well appearing, and well nourished Pulmonary   Normal work of breathing Neurologic   Alert and oriented to person, place, and time Psychiatric   Mood and affect within normal limits  [name], CNM 10/22/24 4:14 PM  "

## 2024-10-23 ENCOUNTER — Encounter: Admitting: Registered Nurse

## 2024-10-28 ENCOUNTER — Ambulatory Visit: Admitting: Obstetrics & Gynecology

## 2024-10-28 VITALS — BP 110/72 | HR 98 | Wt 160.0 lb

## 2024-10-28 DIAGNOSIS — Z3480 Encounter for supervision of other normal pregnancy, unspecified trimester: Secondary | ICD-10-CM

## 2024-10-28 DIAGNOSIS — O99820 Streptococcus B carrier state complicating pregnancy: Secondary | ICD-10-CM | POA: Diagnosis not present

## 2024-10-28 DIAGNOSIS — Z3A39 39 weeks gestation of pregnancy: Secondary | ICD-10-CM
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
## 2024-10-29 ENCOUNTER — Encounter: Payer: Self-pay | Admitting: Obstetrics

## 2024-10-29 ENCOUNTER — Encounter: Admitting: Certified Nurse Midwife

## 2024-10-29 ENCOUNTER — Other Ambulatory Visit: Payer: Self-pay

## 2024-10-29 ENCOUNTER — Observation Stay
Admission: EM | Admit: 2024-10-29 | Discharge: 2024-10-29 | Disposition: A | Discharge status: Home / Self Care | Source: Home / Self Care | Admitting: Obstetrics

## 2024-10-29 DIAGNOSIS — O471 False labor at or after 37 completed weeks of gestation: Secondary | ICD-10-CM | POA: Diagnosis present

## 2024-10-29 DIAGNOSIS — Z3480 Encounter for supervision of other normal pregnancy, unspecified trimester: Principal | ICD-10-CM

## 2024-10-29 DIAGNOSIS — Z3A39 39 weeks gestation of pregnancy: Secondary | ICD-10-CM | POA: Insufficient documentation

## 2024-10-29 DIAGNOSIS — O133 Gestational [pregnancy-induced] hypertension without significant proteinuria, third trimester: Secondary | ICD-10-CM | POA: Diagnosis not present

## 2024-10-29 DIAGNOSIS — G43109 Migraine with aura, not intractable, without status migrainosus: Secondary | ICD-10-CM

## 2024-10-29 LAB — RUPTURE OF MEMBRANE (ROM)PLUS: Rom Plus: NEGATIVE

## 2024-10-29 MED ORDER — LACTATED RINGERS IV SOLN: 500.0000 mL | INTRAVENOUS | Status: DC | PRN

## 2024-10-29 MED ORDER — ACETAMINOPHEN 325 MG PO TABS: 650.0000 mg | ORAL_TABLET | ORAL | Status: DC | PRN

## 2024-10-29 MED ORDER — ONDANSETRON HCL 4 MG/2ML IJ SOLN: 4.0000 mg | Freq: Four times a day (QID) | INTRAMUSCULAR | Status: DC | PRN

## 2024-10-29 MED ORDER — SOD CITRATE-CITRIC ACID 500-334 MG/5ML PO SOLN: 30.0000 mL | ORAL | Status: DC | PRN

## 2024-10-29 MED ORDER — CALCIUM CARBONATE ANTACID 500 MG PO CHEW: 2.0000 | CHEWABLE_TABLET | ORAL | Status: DC | PRN

## 2024-10-29 NOTE — OB Triage Note (Signed)
 LABOR & DELIVERY OB TRIAGE NOTE  SUBJECTIVE  HPI Carmen Black is a 24 y.o. G1P0000 at [redacted]w[redacted]d who presents to Labor & Delivery for contractions. She had a sweep in the office yesterday and has been contracting every 10-12 minutes. She reports passing some white/clear mucus but does not think her water broke. Initial BP was elevated, repeat WNL. She has been normotensive throughout her pregnancy. She denies HA, visual changes, and epigastric pain.  OB History     Gravida  1   Para      Term      Preterm      AB  0   Living         SAB  0   IAB      Ectopic      Multiple      Live Births              Scheduled Meds: Continuous Infusions:  lactated ringers      PRN Meds:.acetaminophen , calcium  carbonate, lactated ringers , ondansetron , sodium citrate-citric acid   OBJECTIVE  BP 131/73   Pulse 93   Temp 98.3 F (36.8 C) (Oral)   Resp 18   LMP 01/25/2024   General: alert, NAD Abdomen: soft, gravid, non-tender Cervical exam: Dilation: 2 Effacement (%): 70 Station: -3 Exam by:: Charlyne Punch RN   NST I reviewed the NST and it was reactive.  Baseline: 145 Variability: moderate Accelerations: present Decelerations:none (one episode of non-continuous tracing with maternal movement) Toco: irregular ctx Category 1  ASSESSMENT Impression  1) Pregnancy at G1P0000, [redacted]w[redacted]d, Estimated Date of Delivery: 10/31/24 2) Reassuring maternal/fetal status 3) Prodromal labor vs uterine irritability  PLAN 1) Discharge home with standard labor/return precautions. Recommend aggressive hydration, warm bath, Tylenol  PM 2) BP check tomorrow 3) Keep scheduled ROB appt  [name], CNM 10/29/24  11:23 PM

## 2024-10-29 NOTE — OB Triage Note (Signed)
 Patient is a 24 yo, G1P0, at 39 weeks and 5 days. Patient presents with complaints of contractions. She reports that she's been having braxton hicks for the past month but her contractions have felt more intense the past 2 days. Pt also reports leaking more watery fluid and is unsure if her water broke or if it's discharge. Patient denies any vaginal bleeding. Patient reports +FM. Monitors applied and assessing. Initial fetal heart tone is 165. Carmen Black CNM notified of patients arrival to unit. Plan to do rom+ and labor eval

## 2024-10-30 ENCOUNTER — Encounter: Payer: Self-pay | Admitting: Licensed Practical Nurse

## 2024-10-30 ENCOUNTER — Ambulatory Visit

## 2024-10-30 ENCOUNTER — Telehealth: Payer: Self-pay | Admitting: Obstetrics

## 2024-10-30 ENCOUNTER — Ambulatory Visit: Admitting: Licensed Practical Nurse

## 2024-10-30 VITALS — BP 100/70 | HR 101 | Wt 160.5 lb

## 2024-10-30 VITALS — BP 139/77 | HR 100 | Wt 161.1 lb

## 2024-10-30 DIAGNOSIS — Z3A39 39 weeks gestation of pregnancy: Secondary | ICD-10-CM | POA: Diagnosis not present

## 2024-10-30 DIAGNOSIS — Z0130 Encounter for examination of blood pressure without abnormal findings: Secondary | ICD-10-CM

## 2024-10-30 DIAGNOSIS — Z3403 Encounter for supervision of normal first pregnancy, third trimester: Secondary | ICD-10-CM

## 2024-10-30 DIAGNOSIS — Z3480 Encounter for supervision of other normal pregnancy, unspecified trimester: Secondary | ICD-10-CM

## 2024-10-30 NOTE — Assessment & Plan Note (Addendum)
-  membranes swept -normotensive today  -Reviewed labor warning signs and expectations for birth. Instructed to call office or come to hospital with persistent headache, vision changes, regular contractions, leaking of fluid, decreased fetal movement or vaginal bleeding.

## 2024-10-30 NOTE — Progress Notes (Cosign Needed Addendum)
" ° ° °  NURSE VISIT NOTE  Subjective:    Patient ID: Carmen Black, female    DOB: [birth year], 24 y.o.   MRN: [id number]  HPI  Patient is a 24 y.o. G83P0000 female who presents for BP check per order from [name], CNM.    BP Readings from Last 3 Encounters:  10/30/24 100/70  10/29/24 131/73  10/28/24 110/72   Pulse Readings from Last 3 Encounters:  10/30/24 (!) 101  10/29/24 93  10/28/24 98    Objective:    BP 100/70   Pulse (!) 101   Wt 160 lb 8 oz (72.8 kg)   LMP 01/25/2024   BMI 29.36 kg/m   Assessment:   No diagnosis found.   Plan:   Per Dr. Jinnie Cookey, CNM:  Return to clinic today @4 :15pm for a cervix check  Patient verbalized understanding of instructions.   Mathis LITTIE Getting, CMA  "

## 2024-10-30 NOTE — Progress Notes (Signed)
" ° ° °  Return Prenatal Note   Subjective   23 y.o. G1P0000 at 103w6d presents for  membrane sweep   Patient  Patient reports: had sweep at last ROB, the next day she did not feel well. Was seen in triage yesterday for contractions, had 1 elevated BP there,  was not in labor, passed mucus plug today, has been having contractions but not frequent-feels miserable- would like  membrane sweep today. Denies HA, visual disturbances or  RUQ pain.   Movement: Present Contractions: Irritability  Objective   Flow sheet Vitals: Pulse Rate: 100 BP: 139/77 Fetal Heart Rate (bpm): 145 Presentation: Vertex Dilation: 2 Effacement (%): 70 Station: -2, -1 Total weight gain: 25 lb 1.6 oz (11.4 kg)  General Appearance  No acute distress, well appearing, and well nourished Pulmonary   Normal work of breathing Neurologic   Alert and oriented to person, place, and time Psychiatric   Mood and affect within normal limits   Assessment/Plan   Plan  24 y.o. G1P0000 at [redacted]w[redacted]d presents for follow-up OB visit. Reviewed prenatal record including previous visit note.  Supervision of other normal pregnancy, antepartum -membranes swept -normotensive today  -Reviewed labor warning signs and expectations for birth. Instructed to call office or come to hospital with persistent headache, vision changes, regular contractions, leaking of fluid, decreased fetal movement or vaginal bleeding.       No orders of the defined types were placed in this encounter.  Return for keep next ROB .   Future Appointments  Date Time Provider Department Center  11/04/2024 10:55 AM Solace Manwarren, Jinnie Jansky, CNM [facility] None    For next visit:  continue with routine prenatal care     JINNIE HERO [facility], CNM  01/30/264:47 PM   "

## 2024-10-30 NOTE — Telephone Encounter (Signed)
 Contacted the patient via phone, She needs a Blood pressure check for 1/30. I left voicemail for the patient to contact our office for scheduling.

## 2024-10-30 NOTE — Telephone Encounter (Signed)
 The patient contacted the office and scheduled for 1/30 at 1:30 pm.

## 2024-10-31 ENCOUNTER — Inpatient Hospital Stay

## 2024-10-31 ENCOUNTER — Inpatient Hospital Stay: Admission: EM | Admit: 2024-10-31 | Discharge: 2024-11-02 | DRG: 806 | Disposition: A

## 2024-10-31 ENCOUNTER — Other Ambulatory Visit: Payer: Self-pay

## 2024-10-31 DIAGNOSIS — G43109 Migraine with aura, not intractable, without status migrainosus: Secondary | ICD-10-CM

## 2024-10-31 DIAGNOSIS — Z8249 Family history of ischemic heart disease and other diseases of the circulatory system: Secondary | ICD-10-CM

## 2024-10-31 DIAGNOSIS — O99820 Streptococcus B carrier state complicating pregnancy: Secondary | ICD-10-CM

## 2024-10-31 DIAGNOSIS — F1290 Cannabis use, unspecified, uncomplicated: Secondary | ICD-10-CM | POA: Diagnosis present

## 2024-10-31 DIAGNOSIS — Z833 Family history of diabetes mellitus: Secondary | ICD-10-CM | POA: Diagnosis not present

## 2024-10-31 DIAGNOSIS — O99824 Streptococcus B carrier state complicating childbirth: Secondary | ICD-10-CM | POA: Diagnosis present

## 2024-10-31 DIAGNOSIS — O99344 Other mental disorders complicating childbirth: Secondary | ICD-10-CM | POA: Diagnosis present

## 2024-10-31 DIAGNOSIS — Z87891 Personal history of nicotine dependence: Secondary | ICD-10-CM

## 2024-10-31 DIAGNOSIS — Z79899 Other long term (current) drug therapy: Secondary | ICD-10-CM | POA: Diagnosis not present

## 2024-10-31 DIAGNOSIS — F419 Anxiety disorder, unspecified: Secondary | ICD-10-CM | POA: Diagnosis present

## 2024-10-31 DIAGNOSIS — O26893 Other specified pregnancy related conditions, third trimester: Secondary | ICD-10-CM | POA: Diagnosis present

## 2024-10-31 DIAGNOSIS — O99324 Drug use complicating childbirth: Secondary | ICD-10-CM | POA: Diagnosis present

## 2024-10-31 DIAGNOSIS — Z3A40 40 weeks gestation of pregnancy: Secondary | ICD-10-CM | POA: Diagnosis not present

## 2024-10-31 DIAGNOSIS — O4292 Full-term premature rupture of membranes, unspecified as to length of time between rupture and onset of labor: Secondary | ICD-10-CM | POA: Diagnosis present

## 2024-10-31 DIAGNOSIS — Z3480 Encounter for supervision of other normal pregnancy, unspecified trimester: Principal | ICD-10-CM

## 2024-10-31 LAB — TYPE AND SCREEN
ABO/RH(D): A POS
Antibody Screen: NEGATIVE

## 2024-10-31 LAB — URINE DRUG SCREEN
Amphetamines: NEGATIVE
Barbiturates: NEGATIVE
Benzodiazepines: NEGATIVE
Cocaine: NEGATIVE
Fentanyl: POSITIVE — AB
Methadone Scn, Ur: NEGATIVE
Opiates: NEGATIVE
Tetrahydrocannabinol: NEGATIVE

## 2024-10-31 LAB — CBC
HCT: 35 % — ABNORMAL LOW (ref 36.0–46.0)
Hemoglobin: 12 g/dL (ref 12.0–15.0)
MCH: 30.2 pg (ref 26.0–34.0)
MCHC: 34.3 g/dL (ref 30.0–36.0)
MCV: 88.2 fL (ref 80.0–100.0)
Platelets: 246 10*3/uL (ref 150–400)
RBC: 3.97 MIL/uL (ref 3.87–5.11)
RDW: 12.8 % (ref 11.5–15.5)
WBC: 14.9 10*3/uL — ABNORMAL HIGH (ref 4.0–10.5)
nRBC: 0 % (ref 0.0–0.2)

## 2024-10-31 LAB — ABO/RH: ABO/RH(D): A POS

## 2024-10-31 MED ORDER — FENTANYL-BUPIVACAINE-NACL 0.5-0.125-0.9 MG/250ML-% EP SOLN: 12.0000 mL/h | EPIDURAL | Status: DC | PRN

## 2024-10-31 MED ORDER — EPHEDRINE 5 MG/ML INJ
INTRAVENOUS | Status: AC
  Filled 2024-10-31: qty 5

## 2024-10-31 MED ORDER — AMMONIA AROMATIC IN INHA
RESPIRATORY_TRACT | Status: AC
  Filled 2024-10-31: qty 10

## 2024-10-31 MED ORDER — LACTATED RINGERS IV SOLN: 500.0000 mL | Freq: Once | INTRAVENOUS | Status: DC

## 2024-10-31 MED ORDER — BENZOCAINE-MENTHOL 20-0.5 % EX AERO
1.0000 | INHALATION_SPRAY | CUTANEOUS | Status: DC | PRN
  Administered 2024-10-31: 1 via TOPICAL
  Filled 2024-10-31 (×2): qty 56

## 2024-10-31 MED ORDER — PANTOPRAZOLE SODIUM 40 MG PO TBEC
40.0000 mg | DELAYED_RELEASE_TABLET | Freq: Every day | ORAL | Status: DC
  Administered 2024-10-31 – 2024-11-02 (×3): 40 mg via ORAL
  Filled 2024-10-31 (×3): qty 1

## 2024-10-31 MED ORDER — SODIUM CHLORIDE 0.9 % IV SOLN
5.0000 10*6.[IU] | Freq: Once | INTRAVENOUS | Status: AC
  Administered 2024-10-31: 5 10*6.[IU] via INTRAVENOUS
  Filled 2024-10-31: qty 5

## 2024-10-31 MED ORDER — SERTRALINE HCL 50 MG PO TABS: 50.0000 mg | ORAL_TABLET | Freq: Every day | ORAL | 1 refills | Status: DC

## 2024-10-31 MED ORDER — MISOPROSTOL 200 MCG PO TABS
ORAL_TABLET | ORAL | Status: AC
  Filled 2024-10-31: qty 4

## 2024-10-31 MED ORDER — FENTANYL CITRATE (PF) 100 MCG/2ML IJ SOLN
50.0000 ug | INTRAMUSCULAR | Status: DC | PRN
  Administered 2024-10-31 (×2): 100 ug via INTRAVENOUS
  Filled 2024-10-31 (×2): qty 2

## 2024-10-31 MED ORDER — PHENYLEPHRINE 80 MCG/ML (10ML) SYRINGE FOR IV PUSH (FOR BLOOD PRESSURE SUPPORT): 80.0000 ug | PREFILLED_SYRINGE | INTRAVENOUS | Status: DC | PRN

## 2024-10-31 MED ORDER — LIDOCAINE HCL (PF) 1 % IJ SOLN: 30.0000 mL | INTRAMUSCULAR | Status: DC | PRN

## 2024-10-31 MED ORDER — TERBUTALINE SULFATE 1 MG/ML IJ SOLN
INTRAMUSCULAR | Status: AC
  Filled 2024-10-31: qty 1

## 2024-10-31 MED ORDER — PENICILLIN G POT IN DEXTROSE 60000 UNIT/ML IV SOLN
3.0000 10*6.[IU] | INTRAVENOUS | Status: DC
  Administered 2024-10-31: 3 10*6.[IU] via INTRAVENOUS
  Filled 2024-10-31: qty 50

## 2024-10-31 MED ORDER — SODIUM CHLORIDE 0.9 % IV SOLN
INTRAVENOUS | Status: DC | PRN
  Administered 2024-10-31: 5 mL via EPIDURAL

## 2024-10-31 MED ORDER — OXYTOCIN 10 UNIT/ML IJ SOLN
INTRAMUSCULAR | Status: AC
  Filled 2024-10-31: qty 2

## 2024-10-31 MED ORDER — WITCH HAZEL-GLYCERIN EX PADS
1.0000 | MEDICATED_PAD | CUTANEOUS | Status: DC | PRN
  Administered 2024-10-31: 1 via TOPICAL
  Filled 2024-10-31 (×2): qty 100

## 2024-10-31 MED ORDER — OXYTOCIN-SODIUM CHLORIDE 30-0.9 UT/500ML-% IV SOLN: 2.5000 [IU]/h | INTRAVENOUS | Status: DC

## 2024-10-31 MED ORDER — ZOLPIDEM TARTRATE 5 MG PO TABS: 5.0000 mg | ORAL_TABLET | Freq: Every evening | ORAL | Status: DC | PRN

## 2024-10-31 MED ORDER — SODIUM CHLORIDE 0.9 % IV SOLN: INTRAVENOUS | Status: DC

## 2024-10-31 MED ORDER — LACTATED RINGERS IV SOLN: INTRAVENOUS | Status: DC

## 2024-10-31 MED ORDER — LIDOCAINE HCL (PF) 1 % IJ SOLN
INTRAMUSCULAR | Status: AC
  Filled 2024-10-31: qty 30

## 2024-10-31 MED ORDER — OXYTOCIN BOLUS FROM INFUSION
333.0000 mL | Freq: Once | INTRAVENOUS | Status: AC
  Administered 2024-10-31: 333 mL via INTRAVENOUS

## 2024-10-31 MED ORDER — SOD CITRATE-CITRIC ACID 500-334 MG/5ML PO SOLN: 30.0000 mL | ORAL | Status: DC | PRN

## 2024-10-31 MED ORDER — CALCIUM CARBONATE ANTACID 500 MG PO CHEW
400.0000 mg | CHEWABLE_TABLET | Freq: Three times a day (TID) | ORAL | Status: DC | PRN
  Administered 2024-10-31: 400 mg via ORAL
  Filled 2024-10-31: qty 2

## 2024-10-31 MED ORDER — FENTANYL-BUPIVACAINE-NACL 0.5-0.125-0.9 MG/250ML-% EP SOLN
EPIDURAL | Status: AC
  Filled 2024-10-31: qty 250

## 2024-10-31 MED ORDER — EPHEDRINE 5 MG/ML INJ: 10.0000 mg | Freq: Once | INTRAVENOUS | Status: DC

## 2024-10-31 MED ORDER — EPHEDRINE 5 MG/ML INJ: 10.0000 mg | INTRAVENOUS | Status: DC | PRN

## 2024-10-31 MED ORDER — ONDANSETRON HCL 4 MG/2ML IJ SOLN
4.0000 mg | Freq: Four times a day (QID) | INTRAMUSCULAR | Status: DC | PRN
  Administered 2024-10-31: 4 mg via INTRAVENOUS
  Filled 2024-10-31: qty 2

## 2024-10-31 MED ORDER — HYDROXYZINE HCL 25 MG PO TABS: 50.0000 mg | ORAL_TABLET | Freq: Four times a day (QID) | ORAL | Status: DC | PRN

## 2024-10-31 MED ORDER — FENTANYL-BUPIVACAINE-NACL 0.5-0.125-0.9 MG/250ML-% EP SOLN
EPIDURAL | Status: DC | PRN
  Administered 2024-10-31: 12 mL/h via EPIDURAL

## 2024-10-31 MED ORDER — LACTATED RINGERS IV SOLN
500.0000 mL | INTRAVENOUS | Status: DC | PRN
  Administered 2024-10-31: 500 mL via INTRAVENOUS

## 2024-10-31 MED ORDER — IBUPROFEN 600 MG PO TABS
600.0000 mg | ORAL_TABLET | Freq: Four times a day (QID) | ORAL | Status: DC
  Administered 2024-11-01 – 2024-11-02 (×7): 600 mg via ORAL
  Filled 2024-10-31 (×6): qty 1

## 2024-10-31 MED ORDER — DIPHENHYDRAMINE HCL 50 MG/ML IJ SOLN: 12.5000 mg | INTRAMUSCULAR | Status: DC | PRN

## 2024-10-31 MED ORDER — DIBUCAINE (PERIANAL) 1 % EX OINT: 1.0000 | TOPICAL_OINTMENT | CUTANEOUS | Status: DC | PRN

## 2024-10-31 MED ORDER — ACETAMINOPHEN 500 MG PO TABS
1000.0000 mg | ORAL_TABLET | Freq: Four times a day (QID) | ORAL | Status: DC | PRN
  Administered 2024-10-31: 1000 mg via ORAL
  Filled 2024-10-31: qty 2

## 2024-10-31 MED ORDER — ACETAMINOPHEN 500 MG PO TABS
1000.0000 mg | ORAL_TABLET | Freq: Four times a day (QID) | ORAL | Status: DC | PRN
  Administered 2024-11-01 – 2024-11-02 (×5): 1000 mg via ORAL
  Filled 2024-10-31 (×5): qty 2

## 2024-10-31 MED ORDER — OXYTOCIN-SODIUM CHLORIDE 30-0.9 UT/500ML-% IV SOLN
INTRAVENOUS | Status: AC
  Filled 2024-10-31: qty 500

## 2024-10-31 NOTE — Progress Notes (Signed)
 Labor Progress Note   ASSESSMENT/PLAN   Carmen Black 24 y.o.   G1P0000  at [redacted]w[redacted]d admited for labor. Comfortable with epidural, rapid dilation progress with FHR decelerations resolved with IVF & repositioning. Anticipate beginning second stage soon. Dr. Sherleen updated.  FWB:  - Fetal well being assessed: Category 2        GBS: - GBS: positive - Prophylaxis: No penicillin  allergy, treat with penicillin   LABOR: - Now in active labor, doing well.  - Pain Management: epidural in place - Discussed options with patient and will continue expectant management - Anticipate SVD   Principal Problem:   Normal labor Active Problems:   GBS (group B Streptococcus carrier), +RV culture, currently pregnant     SUBJECTIVE/OBJECTIVE   SUBJECTIVE: Resting with epidural.    OBJECTIVE: Vital Signs: Patient Vitals for the past 12 hrs:  BP Temp Temp src Pulse Resp SpO2 Height Weight  10/31/24 1616 112/70 -- -- (!) 122 -- -- -- --  10/31/24 1502 112/65 -- -- (!) 103 -- -- -- --  10/31/24 1500 (!) 88/76 98 F (36.7 C) Oral (!) 101 -- 96 % -- --  10/31/24 1455 123/64 -- -- (!) 103 -- 93 % -- --  10/31/24 1315 132/76 -- -- 91 18 -- -- --  10/31/24 1249 -- -- -- -- -- -- 5' 2 (1.575 m) 73.1 kg  10/31/24 1227 -- 98.1 F (36.7 C) Oral -- -- -- -- --    Last SVE:  Dilation: Lip/rim (10/31/24 1604) Effacement (%): 100 (10/31/24 1553) Cervical Position: Anterior (10/31/24 1553) Station: +1 (10/31/24 1553) Presentation: Vertex (10/31/24 1553) Exam by:: Kelsen Celona CNM (10/31/24 1604)  Membranes Membrane Status: SROM Rupture Date: 10/31/24 Rupture Time: 1120 Color: Clear  FHR:   - Mode: External  - Baseline Rate (A): 152 bpm (fht)  - Variability: Moderate  - Characteristics (ie - accels, decels): Accelerations: 15 x 15, Decelerations: variable, prolonged  UTERINE ACTIVITY:   - Mode: Palpation, Toco  - Contraction Frequency (min): 1.5-2.5 minutes

## 2024-10-31 NOTE — Progress Notes (Signed)
 Patient up to [facility] with RN standby assist. Patient only able to void two drops at this time, does not feel urge to void. Vaginal bleeding remains light.

## 2024-10-31 NOTE — Progress Notes (Signed)
 Pt arrived in LDR 2 accompanied by EMS and Carmen Black. Patient came from home due to leaking of fluid which started at approx 1118. Patient reports contractions about one minute apart. Pt transferred to bed. CNM SVE as noted. EFM applied and assessing.

## 2024-10-31 NOTE — Anesthesia Procedure Notes (Signed)
 Epidural Patient location during procedure: OB Start time: 10/31/2024 2:34 PM End time: 10/31/2024 2:36 PM  Staffing Anesthesiologist: Melia Deacon, MD Performed: anesthesiologist   Preanesthetic Checklist Completed: patient identified, IV checked, site marked, risks and benefits discussed, surgical consent, monitors and equipment checked, pre-op evaluation and timeout performed  Epidural Patient position: sitting Prep: ChloraPrep Patient monitoring: heart rate, continuous pulse ox and blood pressure Approach: midline Location: L3-L4 Injection technique: LOR saline  Needle:  Needle type: Tuohy  Needle gauge: 17 G Needle length: 9 cm and 9 Needle insertion depth: 4 cm Catheter type: closed end flexible Catheter size: 19 Gauge Catheter at skin depth: 9 cm Test dose: negative and 1.5% lidocaine  with Epi 1:200 K  Assessment Sensory level: T10 Events: blood not aspirated, no cerebrospinal fluid, injection not painful, no injection resistance, no paresthesia and negative IV test  Additional Notes 1 attempt Pt. Evaluated and documentation done after procedure finished. Patient identified. Risks/Benefits/Options discussed with patient including but not limited to bleeding, infection, nerve damage, paralysis, failed block, incomplete pain control, headache, blood pressure changes, nausea, vomiting, reactions to medication both or allergic, itching and postpartum back pain. Confirmed with bedside nurse the patient's most recent platelet count. Confirmed with patient that they are not currently taking any anticoagulation, have any bleeding history or any family history of bleeding disorders. Patient expressed understanding and wished to proceed. All questions were answered. Sterile technique was used throughout the entire procedure. Please see nursing notes for vital signs. Test dose was given through epidural catheter and negative prior to continuing to dose epidural or start infusion.  Warning signs of high block given to the patient including shortness of breath, tingling/numbness in hands, complete motor block, or any concerning symptoms with instructions to call for help. Patient was given instructions on fall risk and not to get out of bed. All questions and concerns addressed with instructions to call with any issues or inadequate analgesia.    Patient tolerated the insertion well without immediate complications.Reason for block:procedure for pain

## 2024-10-31 NOTE — Discharge Summary (Shared)
 "    Postpartum Discharge Summary  Date of Service updated***     Patient Name: Carmen Black DOB: 2001-09-13 MRN: [id number]  Date of admission: 10/31/2024 Delivery date:10/31/2024 Delivering provider: JAYNE HARLENE LITTIE Date of discharge: 10/31/2024  Admitting diagnosis: Normal labor [O80, Z37.9] Intrauterine pregnancy: [redacted]w[redacted]d     Secondary diagnosis:  Active Problems:   GBS (group B Streptococcus carrier), +RV culture, currently pregnant   Postpartum care following vaginal delivery   Vaginal delivery  Additional problems: Hx depression/anxiety    Discharge diagnosis: Term Pregnancy Delivered                                              Post partum procedures:{Postpartum procedures:23558} Augmentation: N/A Complications: None  Hospital course: Onset of Labor With Vaginal Delivery      24 y.o. yo G1P0000 at [redacted]w[redacted]d was admitted in Active Labor on 10/31/2024. Labor course was complicated by none.  Membrane Rupture Time/Date: 11:20 AM,10/31/2024  Delivery Method:Vaginal, Spontaneous Operative Delivery:N/A Episiotomy: None Lacerations:  None Patient had a postpartum course complicated by ***.  She is ambulating, tolerating a regular diet, passing flatus, and urinating well. Patient is discharged home in stable condition on 10/31/24. She has a history of depression/anxiety & had restarted Zoloft , but ran out of her prescription. Last dose ~1w ago, 25mg  restarted inpatient, prescription for 50mg  to start on discharge sent to pharmacy on file.  Newborn Data: Birth date:10/31/2024 Birth time:8:17 PM Gender:Female Living status:Living Apgars: ,  Weight:   Magnesium  Sulfate received: No BMZ received: No Rhophylac:N/A MMR:N/A T-DaP:Given prenatally Flu: declined RSV Vaccine received: Yes Transfusion:{Transfusion received:30440034} Immunizations administered: Immunization History  Administered Date(s) Administered    sv, Bivalent, Protein Subunit Rsvpref,pf (Abrysvo) 10/09/2024    DTaP 07/03/2000, 09/02/2000, 11/04/2000, 08/04/2001   Dtap, Unspecified 12/28/2004   HIB, Unspecified 11/04/2000, 05/06/2001   HPV Quadrivalent 08/14/2012, 10/15/2012, 02/13/2013   Hep B, Unspecified 04/06/2000, 06/05/2000, 11/04/2000   Hepatitis A 11/06/2007, 02/03/2009   Hepatitis A, Ped/Adol-2 Dose 11/06/2007, 02/03/2009   Hepatitis B Feb 19, 2000, 06/05/2000, 11/04/2000   Hpv-Unspecified 08/14/2012, 10/15/2012, 02/13/2013   IPV 07/03/2000, 09/02/2000, 02/03/2001   Influenza-Unspecified 06/03/2019, 09/12/2021, 08/01/2022   MMR 08/04/2001, 12/28/2004   Meningococcal B, OMV 08/29/2017   Meningococcal Conjugate 08/14/2012, 08/29/2017   PPD Test 01/26/2022, 01/06/2024   Pneumococcal Conjugate PCV 7 07/03/2000, 09/02/2000, 11/04/2000, 05/06/2001, 07/09/2002   Polio, Unspecified 12/28/2004   Tdap 04/18/2011, 03/26/2021, 08/12/2024   Varicella 05/06/2001, 07/09/2002    Recommend 6 weeks of prophylactic anticoagulation with LMWH or subcutaneous unfractionated heparin if 1 or more high risk factor is present.  Recommend 14 days of prophylactic anticoagulation with LMWH or subcutaneous unfractionated heparin if 3 or more moderate risk factors are present.   Risk assessment for postpartum VTE and prophylactic treatment:  High risk factors: {KCVTEHRF:27791} Moderate risk factors: {KCVTEMODRF:27792::None}  Postpartum VTE prophylaxis with LMWH {not indicated/requested/declined:14582::not indicated}   Physical exam  Vitals:   10/31/24 1645 10/31/24 1748 10/31/24 1803 10/31/24 1904  BP: 130/86  120/73   Pulse: (!) 132  (!) 120   Resp:      Temp:  99.9 F (37.7 C)  98.5 F (36.9 C)  TempSrc:  Oral  Oral  SpO2: 91%     Weight:      Height:       General: {Exam; general:21111117} Lochia: {Desc; appropriate/inappropriate:30686::appropriate} Uterine Fundus: {Desc; firm/soft:30687} Incision: {Exam;  incision:21111123} DVT Evaluation: {Exam; dvt:2111122} Labs: Lab Results   Component Value Date   WBC 14.9 (H) 10/31/2024   HGB 12.0 10/31/2024   HCT 35.0 (L) 10/31/2024   MCV 88.2 10/31/2024   PLT 246 10/31/2024      Latest Ref Rng & Units 04/07/2024    2:55 PM  CMP  Glucose 70 - 99 mg/dL 97   BUN 6 - 20 mg/dL 8   Creatinine 9.55 - 8.99 mg/dL 9.48   Sodium 864 - 854 mmol/L 135   Potassium 3.5 - 5.1 mmol/L 3.7   Chloride 98 - 111 mmol/L 104   CO2 22 - 32 mmol/L 22   Calcium  8.9 - 10.3 mg/dL 9.0   Total Protein 6.5 - 8.1 g/dL 6.4   Total Bilirubin 0.0 - 1.2 mg/dL 0.6   Alkaline Phos 38 - 126 U/L 31   AST 15 - 41 U/L 18   ALT 0 - 44 U/L 15    Edinburgh Score:    04/15/2024    3:35 PM  Edinburgh Postnatal Depression Scale Screening Tool  I have been able to laugh and see the funny side of things. 0   I have looked forward with enjoyment to things. 0   I have blamed myself unnecessarily when things went wrong. 1   I have been anxious or worried for no good reason. 2   I have felt scared or panicky for no good reason. 2   Things have been getting on top of me. 2   I have been so unhappy that I have had difficulty sleeping. 0   I have felt sad or miserable. 0   I have been so unhappy that I have been crying. 0   The thought of harming myself has occurred to me. 0   Edinburgh Postnatal Depression Scale Total 7     Data saved with a previous flowsheet row definition       After visit meds:  Allergies as of 10/31/2024   No Known Allergies   Med Rec must be completed prior to using this [facility]***        Discharge home in stable condition Infant Feeding: Bottle and Breast Infant Disposition:home with mother Discharge instruction: per After Visit Summary and Postpartum booklet. Activity: Advance as tolerated. Pelvic rest for 6 weeks.  Diet: routine diet Anticipated Birth Control: Unsure Postpartum VTE prophyhlaxis: Not Indicated Postpartum Appointment:6 weeks Additional Postpartum F/U: Postpartum Depression checkup Future  Appointments: Future Appointments  Date Time Provider Department Center  11/04/2024 10:55 AM Dominic, Jinnie Jansky, CNM [facility] None   Follow up Visit:      10/31/2024 Harlene LITTIE Cisco, CNM   "
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
## 2024-10-31 NOTE — Progress Notes (Signed)
 Labor Progress Note   ASSESSMENT/PLAN   MYKENNA VIELE 24 y.o.   G1P0000  at [redacted]w[redacted]d admited for labor. Now complete, unable to coordinate pushing efforts, epidural rate decreased. Attempt pushing in .  FWB:  - Fetal well being assessed: Category 1        GBS: - GBS: positive - Prophylaxis: No penicillin  allergy, treat with penicillin   LABOR: - Now in active labor, doing well. Appropriate progress. - Pain Management: epidural in place - Discussed options with patient and will start pushing in ~43m. - Temp 99.9, no fetal tachycardia. WBC 14.9 at admit - Anticipate SVD   Principal Problem:   Normal labor Active Problems:   GBS (group B Streptococcus carrier), +RV culture, currently pregnant     SUBJECTIVE/OBJECTIVE   SUBJECTIVE: Denies feeling pressure, feeling some right sided pain feels due to positioning.    OBJECTIVE: Vital Signs: Patient Vitals for the past 12 hrs:  BP Temp Temp src Pulse Resp SpO2 Height Weight  10/31/24 1803 120/73 -- -- (!) 120 -- -- -- --  10/31/24 1748 -- 99.9 F (37.7 C) Oral -- -- -- -- --  10/31/24 1645 130/86 -- -- (!) 132 -- 91 % -- --  10/31/24 1616 112/70 -- -- (!) 122 -- -- -- --  10/31/24 1502 112/65 -- -- (!) 103 -- -- -- --  10/31/24 1500 (!) 88/76 98 F (36.7 C) Oral (!) 101 -- 96 % -- --  10/31/24 1455 123/64 -- -- (!) 103 -- 93 % -- --  10/31/24 1315 132/76 -- -- 91 18 -- -- --  10/31/24 1249 -- -- -- -- -- -- 5' 2 (1.575 m) 73.1 kg  10/31/24 1227 -- 98.1 F (36.7 C) Oral -- -- -- -- --    Last SVE:  Dilation: Lip/rim (10/31/24 1604) Effacement (%): 100 (10/31/24 1553) Cervical Position: Anterior (10/31/24 1553) Station: +1 (10/31/24 1553) Presentation: Vertex (10/31/24 1553) Exam by:: Quashawn Jewkes CNM (10/31/24 1604)  Membranes Membrane Status: SROM Rupture Date: 10/31/24 Rupture Time: 1120 Color: Clear  FHR:   - Mode: External  - Baseline Rate (A): 150 bpm  - Variability: Minimal  - Characteristics (ie -  accels, decels): Accelerations: 10 x 10, Decelerations: None (parts of strip broken, difficulty assessing decels)  UTERINE ACTIVITY:   - Mode: Toco, Palpation  - Contraction Frequency (min): 1.5-2 minutes

## 2024-10-31 NOTE — Progress Notes (Signed)
 Patient up to BR with RN assisting, patient unable to void at this time. Vaginal bleeding remains light.

## 2024-10-31 NOTE — Plan of Care (Signed)
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
## 2024-11-01 LAB — CBC
HCT: 30.3 % — ABNORMAL LOW (ref 36.0–46.0)
Hemoglobin: 10.1 g/dL — ABNORMAL LOW (ref 12.0–15.0)
MCH: 30.3 pg (ref 26.0–34.0)
MCHC: 33.3 g/dL (ref 30.0–36.0)
MCV: 91 fL (ref 80.0–100.0)
Platelets: 197 10*3/uL (ref 150–400)
RBC: 3.33 MIL/uL — ABNORMAL LOW (ref 3.87–5.11)
RDW: 13 % (ref 11.5–15.5)
WBC: 16.8 10*3/uL — ABNORMAL HIGH (ref 4.0–10.5)
nRBC: 0 % (ref 0.0–0.2)

## 2024-11-01 LAB — SYPHILIS: RPR W/REFLEX TO RPR TITER AND TREPONEMAL ANTIBODIES, TRADITIONAL SCREENING AND DIAGNOSIS ALGORITHM: RPR Ser Ql: NONREACTIVE

## 2024-11-01 MED ORDER — DIPHENHYDRAMINE HCL 25 MG PO CAPS: 25.0000 mg | ORAL_CAPSULE | Freq: Four times a day (QID) | ORAL | Status: DC | PRN

## 2024-11-01 MED ORDER — COCONUT OIL OIL
1.0000 | TOPICAL_OIL | Status: DC | PRN
  Filled 2024-11-01: qty 7.5
  Filled 2024-11-01: qty 15

## 2024-11-01 MED ORDER — ONDANSETRON HCL 4 MG/2ML IJ SOLN: 4.0000 mg | INTRAMUSCULAR | Status: DC | PRN

## 2024-11-01 MED ORDER — SIMETHICONE 80 MG PO CHEW
80.0000 mg | CHEWABLE_TABLET | ORAL | Status: DC | PRN
  Administered 2024-11-02: 80 mg via ORAL
  Filled 2024-11-01: qty 1

## 2024-11-01 MED ORDER — SODIUM CHLORIDE 0.9% FLUSH
3.0000 mL | Freq: Two times a day (BID) | INTRAVENOUS | Status: DC
  Administered 2024-11-01: 3 mL via INTRAVENOUS

## 2024-11-01 MED ORDER — POLYETHYLENE GLYCOL 3350 17 G PO PACK: 17.0000 g | PACK | Freq: Every day | ORAL | Status: DC | PRN

## 2024-11-01 MED ORDER — MAGNESIUM HYDROXIDE 400 MG/5ML PO SUSP: 30.0000 mL | ORAL | Status: DC | PRN

## 2024-11-01 MED ORDER — PRENATAL MULTIVITAMIN CH
1.0000 | ORAL_TABLET | Freq: Every day | ORAL | Status: DC
  Administered 2024-11-01 – 2024-11-02 (×2): 1 via ORAL
  Filled 2024-11-01 (×2): qty 1

## 2024-11-01 MED ORDER — DOCUSATE SODIUM 100 MG PO CAPS
100.0000 mg | ORAL_CAPSULE | Freq: Two times a day (BID) | ORAL | Status: DC
  Administered 2024-11-01 – 2024-11-02 (×4): 100 mg via ORAL
  Filled 2024-11-01 (×4): qty 1

## 2024-11-01 MED ORDER — SODIUM CHLORIDE 0.9 % IV SOLN: 250.0000 mL | INTRAVENOUS | Status: AC | PRN

## 2024-11-01 MED ORDER — FERROUS SULFATE 325 (65 FE) MG PO TABS
325.0000 mg | ORAL_TABLET | Freq: Every day | ORAL | Status: DC
  Administered 2024-11-01 – 2024-11-02 (×2): 325 mg via ORAL
  Filled 2024-11-01: qty 1

## 2024-11-01 MED ORDER — LIDOCAINE HCL URETHRAL/MUCOSAL 2 % EX GEL
1.0000 | Freq: Once | CUTANEOUS | Status: AC
  Administered 2024-11-01: 1 via URETHRAL
  Filled 2024-11-01: qty 5

## 2024-11-01 MED ORDER — SERTRALINE HCL 25 MG PO TABS
25.0000 mg | ORAL_TABLET | Freq: Every day | ORAL | Status: DC
  Administered 2024-11-01 – 2024-11-02 (×2): 25 mg via ORAL
  Filled 2024-11-01 (×2): qty 1

## 2024-11-01 MED ORDER — ONDANSETRON HCL 4 MG PO TABS
4.0000 mg | ORAL_TABLET | ORAL | Status: DC | PRN
  Administered 2024-11-01: 4 mg via ORAL
  Filled 2024-11-01: qty 1

## 2024-11-01 MED ORDER — SODIUM CHLORIDE 0.9% FLUSH: 3.0000 mL | INTRAVENOUS | Status: DC | PRN

## 2024-11-01 MED ORDER — OXYCODONE HCL 5 MG PO TABS
10.0000 mg | ORAL_TABLET | Freq: Four times a day (QID) | ORAL | Status: DC | PRN
  Administered 2024-11-01: 10 mg via ORAL
  Filled 2024-11-01: qty 2

## 2024-11-01 NOTE — Anesthesia Postprocedure Evaluation (Signed)
"   Anesthesia Post Note  Patient: Carmen Black  Procedure(s) Performed: AN AD HOC LABOR EPIDURAL  Patient location during evaluation: Mother Baby Anesthesia Type: Epidural Level of consciousness: awake and alert Pain management: pain level controlled Vital Signs Assessment: post-procedure vital signs reviewed and stable Respiratory status: spontaneous breathing, nonlabored ventilation and respiratory function stable Cardiovascular status: stable Postop Assessment: no headache, able to ambulate and adequate PO intake Anesthetic complications: no Comments: Patient has not yet voided her bladder.  They had to reinsert a catheter overnight, but removed it again and she is to try and void in the next several hours.   No notable events documented.   Last Vitals:  Vitals:   11/01/24 1139 11/01/24 1540  BP: 109/61 118/69  Pulse: 75 80  Resp: 20 18  Temp: 36.8 C 36.7 C  SpO2:      Last Pain:  Vitals:   11/01/24 1540  TempSrc: Oral  PainSc: 0-No pain                 [name]      "

## 2024-11-01 NOTE — Clinical Social Work Maternal (Signed)
 " CLINICAL SOCIAL WORK MATERNAL/CHILD NOTE  Patient Details  Name: Carmen Black MRN: [id number] Date of Birth: 05/01/2001  Date:  11/01/2024  Clinical Social Worker Initiating Note:  Shahin Knierim Date/Time: Initiated:  11/01/24/1100     Child's Name:  Carmen Black   Biological Parents:  Mother   Need for Interpreter:  None   Reason for Referral:  Current Substance Use/Substance Use During Pregnancy     Address:  [address] [facility] [place] [address]    Phone number:  [phone] (home)     Additional phone number:   Household Members/Support Persons (HM/SP):   Household Member/Support Person 1   HM/SP Name Relationship DOB or Age  HM/SP -1 Judeth Piety unknown relationship 25  HM/SP -2        HM/SP -3        HM/SP -4        HM/SP -5        HM/SP -6        HM/SP -7        HM/SP -8          Natural Supports (not living in the home):  Immediate Family, Friends   Herbalist:     Employment:     Type of Work: Patient reports that she works for [employer]   Education:  Some Materials Engineer arranged:    Surveyor, Quantity Resources:  [employer]   Other Resources:  [employer], Sales Executive     Cultural/Religious Considerations Which May Impact Care:    Strengths:  Ability to meet basic needs  , Home prepared for child  , Psychotropic Medications, Pediatrician chosen   Psychotropic Medications:  Zoloft      Pediatrician:    [employer]chant [employer] Officer List:   [employer] [place]    [facility]  [facility]      Pediatrician Fax Number:    Risk Factors/Current Problems:  Substance Use     Cognitive State:  Alert     Mood/Affect:  Calm     CSW Assessment:  Chart reviewed. 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 Please note the mother tested positive for THC in 04/15/24 but did not test positive during admission. Baby UDS was only positive for fentanyl from delivery  medications that was given to mom to manage pain.   Please note ICM is working remotely due to inclement weather. SW was able to speak with mother via telephone call. I introduced myself, my role, and reason for consult. The patient reports that she was fine after delivery. The patient reports that she does not know who the FOB was. The patient confirmed her address was [address] [place] [address] and her telephone number is [phone]. The patient reports that she has a lot of outside support. The patient reports that she lives with Judeth Piety (25), did not specify relationship.   The patient reports that her highest level of education is some college. The patient reports that she has a part time job at [employer]. The patient reports that she receives [facility] and food stamps and will speak with DSS about benefits. The patient reports she will breast feed and has a breast pump. The patient reports that she has a PCP and the baby will go to [facility] for medical appointments.   The patient reports that she has a mental health  history of depression, anxiety, and bipolar 1. The patient reports that she takes Zoloft to manage symptoms. The patient reports that she is not currently active in therapy. The patient declined therapy resources. The patient denied all past and current SI/HI/DV during the consult.   The patient reports that she has a bassinet, pack and play, clothes, car seat, and diapers for the baby. Patient reports that Judeth Piety will assist her D/C.   I reviewed information on post partum depression and sudden infant death syndrome.  SW informed the patient about the hospital policy and the law on mandated reporting for drug exposed children. The patient was accepting and verbalized understanding.   CPS report made with on call worker, Sam.     CSW Plan/Description:  Sudden Infant Death Syndrome (SIDS) Education, Perinatal Mood and Anxiety Disorder (PMADs)  Education, Hospital Drug Screen Policy Information, Child Protective Service Report  , CSW Will Continue to Monitor Umbilical Cord Tissue Drug Screen Results and Make Report if Warranted    [facility] Prisma Decarlo, LCSW 11/01/2024, 12:03 PM "

## 2024-11-01 NOTE — Anesthesia Post-op Follow-up Note (Signed)
" °  Anesthesia Pain Follow-up Note  Patient: Carmen Black  Day #: 1  Date of Follow-up: 11/01/2024 Time: 3:54 PM  Last Vitals:  Vitals:   11/01/24 1139 11/01/24 1540  BP: 109/61 118/69  Pulse: 75 80  Resp: 20 18  Temp: 36.8 C 36.7 C  SpO2:      Level of Consciousness: alert  Pain: none   Side Effects:Pruritis  Catheter Site Exam:clean, dry     Plan: D/C from anesthesia care at surgeon's request  [name]     "

## 2024-11-02 ENCOUNTER — Other Ambulatory Visit: Payer: Self-pay

## 2024-11-02 MED ORDER — DOCUSATE SODIUM 100 MG PO CAPS: 100.0000 mg | ORAL_CAPSULE | Freq: Two times a day (BID) | ORAL | Status: AC | PRN

## 2024-11-02 MED ORDER — SERTRALINE HCL 50 MG PO TABS
50.0000 mg | ORAL_TABLET | Freq: Every day | ORAL | 3 refills | Status: AC
  Filled 2024-11-02: qty 90, 90d supply, fill #0

## 2024-11-02 MED ORDER — SENNA 8.6 MG PO TABS
1.0000 | ORAL_TABLET | Freq: Every day | ORAL | Status: DC
  Administered 2024-11-02: 8.6 mg via ORAL
  Filled 2024-11-02: qty 1

## 2024-11-02 MED ORDER — OXYCODONE HCL 5 MG PO TABS
5.0000 mg | ORAL_TABLET | Freq: Four times a day (QID) | ORAL | Status: DC | PRN
  Administered 2024-11-02: 5 mg via ORAL
  Filled 2024-11-02: qty 1

## 2024-11-02 MED ORDER — OXYCODONE HCL 5 MG PO TABS
5.0000 mg | ORAL_TABLET | Freq: Four times a day (QID) | ORAL | 0 refills | Status: AC | PRN
  Filled 2024-11-02: qty 8, 2d supply, fill #0

## 2024-11-02 MED ORDER — SENNA 8.6 MG PO TABS: 1.0000 | ORAL_TABLET | Freq: Every day | ORAL | Status: AC | PRN

## 2024-11-02 MED ORDER — LIDOCAINE 5 % EX PTCH
1.0000 | MEDICATED_PATCH | CUTANEOUS | 0 refills | Status: DC
  Filled 2024-11-02: qty 10, 10d supply, fill #0

## 2024-11-02 MED ORDER — LIDOCAINE 4 % EX PTCH: 1.0000 | MEDICATED_PATCH | CUTANEOUS | Status: AC

## 2024-11-02 MED ORDER — ACETAMINOPHEN 500 MG PO TABS: 1000.0000 mg | ORAL_TABLET | Freq: Four times a day (QID) | ORAL | Status: AC | PRN

## 2024-11-02 MED ORDER — MEDROXYPROGESTERONE ACETATE 150 MG/ML IM SUSP: 150.0000 mg | INTRAMUSCULAR | Status: DC

## 2024-11-02 MED ORDER — IBUPROFEN 600 MG PO TABS: 600.0000 mg | ORAL_TABLET | Freq: Four times a day (QID) | ORAL | Status: AC | PRN

## 2024-11-02 NOTE — Discharge Instructions (Signed)

## 2024-11-02 NOTE — Plan of Care (Signed)
 Patient discharged home with significant other. Discharge instructions, when to follow up, and prescriptions reviewed with patient.  Patient verbalized understanding. Patient will be escorted out by RN. Elyn Sharps, RN 11/02/24 @1611 

## 2024-11-04 ENCOUNTER — Encounter: Admitting: Licensed Practical Nurse

## 2024-11-12 ENCOUNTER — Ambulatory Visit: Admitting: Certified Nurse Midwife

## 2024-12-16 ENCOUNTER — Ambulatory Visit: Admitting: Certified Nurse Midwife
